# Patient Record
Sex: Female | Born: 1955
Health system: Southern US, Community
[De-identification: ages and names within clinical notes are randomized; demographics above are authoritative.]

## PROBLEM LIST (undated history)

## (undated) DIAGNOSIS — M1990 Unspecified osteoarthritis, unspecified site: Secondary | ICD-10-CM

## (undated) DIAGNOSIS — IMO0001 Reserved for inherently not codable concepts without codable children: Secondary | ICD-10-CM

## (undated) DIAGNOSIS — G4730 Sleep apnea, unspecified: Secondary | ICD-10-CM

## (undated) DIAGNOSIS — F419 Anxiety disorder, unspecified: Secondary | ICD-10-CM

## (undated) DIAGNOSIS — J189 Pneumonia, unspecified organism: Secondary | ICD-10-CM

## (undated) DIAGNOSIS — G459 Transient cerebral ischemic attack, unspecified: Secondary | ICD-10-CM

## (undated) DIAGNOSIS — D649 Anemia, unspecified: Secondary | ICD-10-CM

## (undated) DIAGNOSIS — G2581 Restless legs syndrome: Secondary | ICD-10-CM

## (undated) DIAGNOSIS — J302 Other seasonal allergic rhinitis: Secondary | ICD-10-CM

## (undated) DIAGNOSIS — E119 Type 2 diabetes mellitus without complications: Secondary | ICD-10-CM

## (undated) DIAGNOSIS — G629 Polyneuropathy, unspecified: Secondary | ICD-10-CM

## (undated) DIAGNOSIS — J40 Bronchitis, not specified as acute or chronic: Secondary | ICD-10-CM

## (undated) DIAGNOSIS — F32A Depression, unspecified: Secondary | ICD-10-CM

## (undated) DIAGNOSIS — I10 Essential (primary) hypertension: Secondary | ICD-10-CM

## (undated) DIAGNOSIS — T8859XA Other complications of anesthesia, initial encounter: Secondary | ICD-10-CM

## (undated) DIAGNOSIS — K219 Gastro-esophageal reflux disease without esophagitis: Secondary | ICD-10-CM

## (undated) DIAGNOSIS — E785 Hyperlipidemia, unspecified: Secondary | ICD-10-CM

## (undated) DIAGNOSIS — T4145XA Adverse effect of unspecified anesthetic, initial encounter: Secondary | ICD-10-CM

## (undated) DIAGNOSIS — R55 Syncope and collapse: Secondary | ICD-10-CM

## (undated) DIAGNOSIS — F329 Major depressive disorder, single episode, unspecified: Secondary | ICD-10-CM

## (undated) HISTORY — DX: Hyperlipidemia, unspecified: E78.5

## (undated) HISTORY — PX: TUBAL LIGATION: SHX77

## (undated) HISTORY — PX: BREAST LUMPECTOMY: SHX2

## (undated) HISTORY — PX: SHOULDER ARTHROSCOPY WITH ROTATOR CUFF REPAIR: SHX5685

## (undated) HISTORY — PX: HAND SURGERY: SHX662

---

## 2002-04-17 ENCOUNTER — Emergency Department (HOSPITAL_COMMUNITY): Admission: EM | Admit: 2002-04-17 | Discharge: 2002-04-17 | Payer: Self-pay | Admitting: Internal Medicine

## 2004-06-26 DIAGNOSIS — G459 Transient cerebral ischemic attack, unspecified: Secondary | ICD-10-CM

## 2004-06-26 HISTORY — DX: Transient cerebral ischemic attack, unspecified: G45.9

## 2007-01-24 ENCOUNTER — Ambulatory Visit: Payer: Self-pay | Admitting: Cardiology

## 2011-04-04 ENCOUNTER — Other Ambulatory Visit: Payer: Self-pay | Admitting: Orthopaedic Surgery

## 2011-04-04 ENCOUNTER — Other Ambulatory Visit (HOSPITAL_COMMUNITY): Payer: Medicare Other

## 2011-04-04 ENCOUNTER — Other Ambulatory Visit (HOSPITAL_COMMUNITY): Payer: Self-pay | Admitting: Orthopaedic Surgery

## 2011-04-04 ENCOUNTER — Encounter (HOSPITAL_COMMUNITY): Payer: Medicare Other

## 2011-04-04 ENCOUNTER — Ambulatory Visit (HOSPITAL_COMMUNITY)
Admission: RE | Admit: 2011-04-04 | Discharge: 2011-04-04 | Disposition: A | Discharge status: Home / Self Care | Payer: Medicare Other | Source: Ambulatory Visit | Attending: Orthopaedic Surgery | Admitting: Orthopaedic Surgery

## 2011-04-04 DIAGNOSIS — F17200 Nicotine dependence, unspecified, uncomplicated: Secondary | ICD-10-CM | POA: Insufficient documentation

## 2011-04-04 DIAGNOSIS — Z01818 Encounter for other preprocedural examination: Secondary | ICD-10-CM | POA: Insufficient documentation

## 2011-04-04 DIAGNOSIS — Z01812 Encounter for preprocedural laboratory examination: Secondary | ICD-10-CM | POA: Insufficient documentation

## 2011-04-04 DIAGNOSIS — E119 Type 2 diabetes mellitus without complications: Secondary | ICD-10-CM | POA: Insufficient documentation

## 2011-04-04 DIAGNOSIS — Z01810 Encounter for preprocedural cardiovascular examination: Secondary | ICD-10-CM | POA: Insufficient documentation

## 2011-04-04 DIAGNOSIS — I10 Essential (primary) hypertension: Secondary | ICD-10-CM | POA: Insufficient documentation

## 2011-04-04 LAB — CBC
HCT: 44.5 % (ref 36.0–46.0)
Hemoglobin: 15.3 g/dL — ABNORMAL HIGH (ref 12.0–15.0)
MCH: 32.8 pg (ref 26.0–34.0)
MCV: 95.5 fL (ref 78.0–100.0)
Platelets: 211 10*3/uL (ref 150–400)
RBC: 4.66 MIL/uL (ref 3.87–5.11)
WBC: 9 10*3/uL (ref 4.0–10.5)

## 2011-04-04 LAB — BASIC METABOLIC PANEL
CO2: 28 mEq/L (ref 19–32)
Calcium: 10 mg/dL (ref 8.4–10.5)
Chloride: 100 mEq/L (ref 96–112)
Glucose, Bld: 178 mg/dL — ABNORMAL HIGH (ref 70–99)
Potassium: 4.1 mEq/L (ref 3.5–5.1)
Sodium: 136 mEq/L (ref 135–145)

## 2011-04-04 LAB — PROTIME-INR: INR: 0.99 (ref 0.00–1.49)

## 2011-04-06 ENCOUNTER — Ambulatory Visit (HOSPITAL_COMMUNITY)
Admission: RE | Admit: 2011-04-06 | Discharge: 2011-04-06 | Disposition: A | Discharge status: Home / Self Care | Payer: Medicare Other | Source: Ambulatory Visit | Attending: Orthopaedic Surgery | Admitting: Orthopaedic Surgery

## 2011-04-06 DIAGNOSIS — M79609 Pain in unspecified limb: Secondary | ICD-10-CM | POA: Insufficient documentation

## 2011-04-06 DIAGNOSIS — F17200 Nicotine dependence, unspecified, uncomplicated: Secondary | ICD-10-CM | POA: Insufficient documentation

## 2011-04-06 DIAGNOSIS — E119 Type 2 diabetes mellitus without complications: Secondary | ICD-10-CM | POA: Insufficient documentation

## 2011-04-06 DIAGNOSIS — M7020 Olecranon bursitis, unspecified elbow: Secondary | ICD-10-CM | POA: Insufficient documentation

## 2011-04-06 DIAGNOSIS — K219 Gastro-esophageal reflux disease without esophagitis: Secondary | ICD-10-CM | POA: Insufficient documentation

## 2011-04-06 DIAGNOSIS — Z7982 Long term (current) use of aspirin: Secondary | ICD-10-CM | POA: Insufficient documentation

## 2011-04-06 DIAGNOSIS — M25529 Pain in unspecified elbow: Secondary | ICD-10-CM | POA: Insufficient documentation

## 2011-04-06 DIAGNOSIS — Z79899 Other long term (current) drug therapy: Secondary | ICD-10-CM | POA: Insufficient documentation

## 2011-04-06 DIAGNOSIS — I10 Essential (primary) hypertension: Secondary | ICD-10-CM | POA: Insufficient documentation

## 2011-04-06 DIAGNOSIS — M6530 Trigger finger, unspecified finger: Secondary | ICD-10-CM | POA: Insufficient documentation

## 2011-04-06 LAB — GLUCOSE, CAPILLARY: Glucose-Capillary: 225 mg/dL — ABNORMAL HIGH (ref 70–99)

## 2011-04-11 NOTE — Op Note (Signed)
NAMECREASIE, LACOSSE NO.:  [id number]  MEDICAL RECORD NO.:  [id number]  LOCATION:  DAYL                         FACILITY:  [facility]  PHYSICIAN:  Vanita Panda. Magnus Ivan, M.D.DATE OF BIRTH:  [birth year]  DATE OF PROCEDURE:  04/06/2011 DATE OF DISCHARGE:                              OPERATIVE REPORT   PREOPERATIVE DIAGNOSES: 1. Bilateral trigger thumbs. 2. Right elbow olecranon bursitis.  POSTOPERATIVE DIAGNOSES: 1. Bilateral trigger thumbs. 2. Right elbow olecranon bursitis.  PROCEDURES: 1. Right thumb A1 pulley release. 2. Right elbow olecranon bursa steroid injection. 3. Left thumb A1 pulley steroid injection.  SURGEON:  Vanita Panda. Magnus Ivan, M.D.  ANESTHESIA: 1. General. 2. Local.  INJECTIONS: 1. Right elbow with 1 mL of Depo-Medrol and 2 mL of 0.25% plain     Marcaine. 2. Left thumb with 1 mL of Depo-Medrol and 1 mL of 0.25% plain     Marcaine.  TOURNIQUET TIME:  12 minutes.  BLOOD LOSS:  Minimal.  COMPLICATIONS:  None.  INDICATIONS:  Victoria Lara is a 55 year old female with recurrent bilateral trigger thumb, who has failed multiple injections and conservative treatments.  She still has severe pain over the A1 pulley on both thumbs, and active triggering.  She has also developed right elbow olecranon bursitis.  At this  point, she wished to proceed with surgical intervention.  She understands the risks and benefits of the surgery including the risk of acute blood loss anemia, the risk of nerve and vessel injury and even given her age the risk of death from any surgery even as minor as this.  I have also discussed with her the success rate of A1 pulley  release and the injection as well as the effect of steroids on her blood glucose level.  She does wish to proceed with surgery.  PROCEDURE DESCRIPTION:  After informed consent was obtained, appropriate right thumb, left thumb, and right elbow were marked.  She was brought to the  operating room, placed supine on the operating table.  General anesthesia was then obtained.  We first turned attention to the right arm.  We placed a nonsterile tourniquet on her right upper arm and then her arm was prepped and draped from the olecranon bursa down to the fingers with DuraPrep and sterile drapes applied.  A time-out was called, which identified her as the correct patient and correct right thumb for the A1 pulley release, the right elbow for the injection, and left thumb for the steroid injection.  Esmarch was used to wrap up the hands and tourniquet was inflated to 250 mmHg of pressure.  I then made an incision directly over the A1 pulley and dissected down to the A1 pulley on the right thumb on the volar aspect of the hand to divide this in its entirety and watched the flexor tendon easily glide.  I then copiously irrigated the tissue and closed the skin with interrupted 3-0 nylon suture and infiltrated the incision with 0.25% plain Sensorcaine. Xeroform followed by a well-padded sterile dressing was applied.  Next, with the elbow we provided an injection of 2 mL of 0.25% plain Marcaine mixed with 1 mL of Depo-Medrol.  I placed a Band-Aid over this, then we went  over to the left thumb and cleaned this with Betadine and alcohol prior to an injection of 1 mL of 0.25% plain Marcaine and 1 mL of Depo- Medrol, which she tolerated well and we placed a Band-Aid over this. She was awakened, extubated, and taken to the recovery room in stable condition.  All final counts were correct.  There were no complications noted.  The tourniquet was let down and the fingers did pink nicely. There were no complications.  Postoperatively, she will be discharged from the recovery room to home with appropriate followup instructions.     Vanita Panda. Magnus Ivan, M.D.     CYB/MEDQ  D:  04/06/2011  T:  04/06/2011  Job:  [id number]  Electronically Signed by Doneen Poisson M.D. on  04/11/2011 09:10:47 PM

## 2011-04-11 NOTE — H&P (Signed)
Victoria Lara, Victoria Lara NO.:  [id number]  MEDICAL RECORD NO.:  [id number]  LOCATION:  DAYL                         FACILITY:  [facility]  PHYSICIAN:  Vanita Panda. Magnus Ivan, M.D.DATE OF BIRTH:  June 17, 1956  DATE OF ADMISSION:  04/06/2011 DATE OF DISCHARGE:                             HISTORY & PHYSICAL   CHIEF COMPLAINT:  Bilateral thumb pain and right elbow pain.  HISTORY OF PRESENT ILLNESS:  Victoria Lara is a 55 year old patient well- known to me.  She has bilateral trigger thumbs and has had at least 2 injections on each A1 pulley, they were unsuccessful.  She continues to have pain in triggering.  She has also developed pain over her right elbow olecranon bursa.  At this point, we are going to proceed to the operating room for a right thumb A1 pulley release and then steroid injection to the right elbow olecranon bursa and in the left thumb A1 pulley area.  She understands the risks and benefits of the surgery in detail.  She understands the hopeful success of this with near 100% success on the right side given the release and then significant success on the left arm and the right elbow given the steroid injection.  She also understands the risks and benefits of surgery in detail including the risks of nerve and vessel injury, acute blood loss, anemia, and even death in light of these several procedures.  She also understands that the steroid may affect her blood glucose.  It has not done that in the past, but it still is possible.  PAST MEDICAL HISTORY: 1. Diabetes. 2. Acid reflux. 3. Anxiety. 4. Arthritis. 5. Depression. 6. History of stroke/TIA.  CURRENT MEDICATION: 1. Metformin 2. Daily aspirin 3. Gabapentin 4. Lisinopril 5. Diprolene 6. Alprazolam 7. Omeprazole 8. Amitriptyline 9. Nabumetone.  ALLERGIES:  No known drug allergies.  FAMILY MEDICAL HISTORY:  Diabetes and high blood pressure.  SOCIAL HISTORY:  She is on disability.  She is a  Child psychotherapist.  She smokes half a pack a day for 15 years.  She does not drink alcohol.  REVIEW OF SYSTEMS:  Negative for chest pain, shortness of breath, fevers, chills, nausea, vomiting.  Positive for cough.  PHYSICAL EXAMINATION:  VITAL SIGNS:  She is afebrile, stable vital signs, her vitals can be seen on the records. GENERAL:  She is alert and oriented x3, in no acute distress. HEENT:  Normocephalic, atraumatic.  Pupils equal, round, reactive to light. NECK:  Supple. LUNGS:  Show upper respiratory congestion, but the bases are clear. HEART:  Regular rate and rhythm. ABDOMEN:  Benign. EXTREMITIES:  Both thumbs show active triggering with pain over the A1 pulley bilaterally.  Her right elbow shows pain over the olecranon bursa.  ASSESSMENT: 1. Bilateral trigger thumbs. 2. Right upper olecranon bursitis.  PLAN:  We are going to proceed to the operating room for; 1. Right thumb A1 pulley release. 2. Left thumb steroid injection. 3. Right elbow steroid injection, olecranon bursa.  This is an outpatient surgery, so she will be discharged following surgery.     Vanita Panda. Magnus Ivan, M.D.     CYB/MEDQ  D:  04/06/2011  T:  04/06/2011  Job:  [id number]  Electronically Signed  by Doneen Poisson M.D. on 04/11/2011 09:10:45 PM

## 2013-01-03 ENCOUNTER — Encounter (HOSPITAL_COMMUNITY): Payer: Self-pay

## 2013-01-03 ENCOUNTER — Emergency Department (HOSPITAL_COMMUNITY): Payer: PRIVATE HEALTH INSURANCE

## 2013-01-03 ENCOUNTER — Emergency Department (HOSPITAL_COMMUNITY)
Admission: EM | Admit: 2013-01-03 | Discharge: 2013-01-03 | Disposition: A | Discharge status: Home / Self Care | Payer: PRIVATE HEALTH INSURANCE | Attending: Emergency Medicine | Admitting: Emergency Medicine

## 2013-01-03 DIAGNOSIS — Z7982 Long term (current) use of aspirin: Secondary | ICD-10-CM | POA: Insufficient documentation

## 2013-01-03 DIAGNOSIS — F3289 Other specified depressive episodes: Secondary | ICD-10-CM | POA: Insufficient documentation

## 2013-01-03 DIAGNOSIS — J309 Allergic rhinitis, unspecified: Secondary | ICD-10-CM | POA: Insufficient documentation

## 2013-01-03 DIAGNOSIS — Z791 Long term (current) use of non-steroidal anti-inflammatories (NSAID): Secondary | ICD-10-CM | POA: Insufficient documentation

## 2013-01-03 DIAGNOSIS — I10 Essential (primary) hypertension: Secondary | ICD-10-CM | POA: Insufficient documentation

## 2013-01-03 DIAGNOSIS — Z79899 Other long term (current) drug therapy: Secondary | ICD-10-CM | POA: Insufficient documentation

## 2013-01-03 DIAGNOSIS — F17200 Nicotine dependence, unspecified, uncomplicated: Secondary | ICD-10-CM | POA: Insufficient documentation

## 2013-01-03 DIAGNOSIS — N390 Urinary tract infection, site not specified: Secondary | ICD-10-CM | POA: Insufficient documentation

## 2013-01-03 DIAGNOSIS — F329 Major depressive disorder, single episode, unspecified: Secondary | ICD-10-CM | POA: Insufficient documentation

## 2013-01-03 DIAGNOSIS — E1169 Type 2 diabetes mellitus with other specified complication: Secondary | ICD-10-CM | POA: Insufficient documentation

## 2013-01-03 HISTORY — DX: Type 2 diabetes mellitus without complications: E11.9

## 2013-01-03 HISTORY — DX: Major depressive disorder, single episode, unspecified: F32.9

## 2013-01-03 HISTORY — DX: Essential (primary) hypertension: I10

## 2013-01-03 HISTORY — DX: Other seasonal allergic rhinitis: J30.2

## 2013-01-03 HISTORY — DX: Depression, unspecified: F32.A

## 2013-01-03 LAB — CBC WITH DIFFERENTIAL/PLATELET
Band Neutrophils: 0 % (ref 0–10)
Basophils Absolute: 0 10*3/uL (ref 0.0–0.1)
Basophils Relative: 0 % (ref 0–1)
Blasts: 0 %
Eosinophils Absolute: 0 10*3/uL (ref 0.0–0.7)
Eosinophils Relative: 0 % (ref 0–5)
HCT: 44.4 % (ref 36.0–46.0)
Hemoglobin: 15.5 g/dL — ABNORMAL HIGH (ref 12.0–15.0)
Lymphocytes Relative: 1 % — ABNORMAL LOW (ref 12–46)
Lymphs Abs: 0.1 10*3/uL — ABNORMAL LOW (ref 0.7–4.0)
MCV: 94.9 fL (ref 78.0–100.0)
Metamyelocytes Relative: 0 %
Monocytes Absolute: 0.6 10*3/uL (ref 0.1–1.0)
Monocytes Relative: 4 % (ref 3–12)
RBC: 4.68 MIL/uL (ref 3.87–5.11)
RDW: 13.1 % (ref 11.5–15.5)
WBC: 14.5 10*3/uL — ABNORMAL HIGH (ref 4.0–10.5)

## 2013-01-03 LAB — URINALYSIS, ROUTINE W REFLEX MICROSCOPIC
Glucose, UA: >1000 mg/dL — AB
Ketones, ur: 40 mg/dL — AB
Protein, ur: 100 mg/dL — AB
Urobilinogen, UA: 1 mg/dL (ref 0.0–1.0)

## 2013-01-03 LAB — BASIC METABOLIC PANEL
Calcium: 10.3 mg/dL (ref 8.4–10.5)
GFR calc Af Amer: >90 mL/min (ref >90)
GFR calc non Af Amer: >90 mL/min (ref >90)
Glucose, Bld: 401 mg/dL — ABNORMAL HIGH (ref 70–99)
Potassium: 4.3 mEq/L (ref 3.5–5.1)
Sodium: 131 mEq/L — ABNORMAL LOW (ref 135–145)

## 2013-01-03 LAB — URINE MICROSCOPIC-ADD ON

## 2013-01-03 MED ORDER — CIPROFLOXACIN HCL 500 MG PO TABS: 500.0000 mg | ORAL_TABLET | Freq: Two times a day (BID) | ORAL | Status: DC

## 2013-01-03 MED ORDER — DEXTROSE 5 % IV SOLN
1.0000 g | Freq: Once | INTRAVENOUS | Status: AC
  Administered 2013-01-03: 1 g via INTRAVENOUS
  Filled 2013-01-03: qty 10

## 2013-01-03 MED ORDER — INSULIN ASPART 100 UNIT/ML ~~LOC~~ SOLN
5.0000 [IU] | Freq: Once | SUBCUTANEOUS | Status: AC
  Administered 2013-01-03: 5 [IU] via INTRAVENOUS
  Filled 2013-01-03: qty 1

## 2013-01-03 MED ORDER — SODIUM CHLORIDE 0.9 % IV BOLUS (SEPSIS)
1000.0000 mL | Freq: Once | INTRAVENOUS | Status: AC
  Administered 2013-01-03: 1000 mL via INTRAVENOUS

## 2013-01-03 NOTE — ED Provider Notes (Signed)
History    This chart was scribed for Donnetta Hutching, MD, by Frederik Pear, ED scribe. The patient was seen in room APA08/APA08 and the patient's care was started at 1707.   CSN: [id number] Arrival date & time 01/03/13  1618  First MD Initiated Contact with Patient 01/03/13 1707     Chief Complaint  Patient presents with  . Hyperglycemia  . Fever   (Consider location/radiation/quality/duration/timing/severity/associated sxs/prior Treatment) The history is provided by the patient, medical records and a relative. No language interpreter was used.   HPI Comments: Victoria Lara is a 57 y.o. female with a h/o of hypertension and DM who presents to the Emergency Department complaining of an intermittent fever that spiked at 103.4 earlier today with associated general malaise that began 3 days ago. She also complains of diaphoresis and chills that began today. In ED, her temperature is 100.8. Her daughter in law reports she called her this afternoon, and she was confused on the phone. When she arrived, she was increasingly confused so they went to her PCP, Dr. Neita Carp at [facility] in [place]. At the PCP, her temperature was 103.4, HR was 138, and CBG was 302. They administered a dose of Tylenol, which provided some relief. In ED, her HR is 131 and CBG was 378. She reports she has not eaten today because she has not had an appetite. She has a h/o of chronic right-sided lower back pain related to sciatica and chronic sinusitis. She is a current, every day smoker.   Past Medical History  Diagnosis Date  . Diabetes mellitus without complication   . Hypertension   . Depression   . Seasonal allergies    History reviewed. No pertinent past surgical history. No family history on file. History  Substance Use Topics  . Smoking status: Current Every Day Smoker  . Smokeless tobacco: Not on file  . Alcohol Use: No   OB History   Grav Para Term Preterm Abortions TAB SAB Ect Mult Living                 Review of Systems A complete 10 system review of systems was obtained and all systems are negative except as noted in the HPI and PMH.  Allergies  Review of patient's allergies indicates no known allergies.  Home Medications   Current Outpatient Rx  Name  Route  Sig  Dispense  Refill  . amitriptyline (ELAVIL) 25 MG tablet   Oral   Take 25 mg by mouth at bedtime.         Marland Kitchen aspirin 325 MG tablet   Oral   Take 325 mg by mouth daily.         Marland Kitchen buPROPion (WELLBUTRIN XL) 150 MG 24 hr tablet   Oral   Take 150 mg by mouth daily.         . cetirizine (ZYRTEC) 10 MG tablet   Oral   Take 10 mg by mouth daily.         . cyclobenzaprine (FLEXERIL) 10 MG tablet   Oral   Take 10 mg by mouth 3 (three) times daily as needed for muscle spasms.         . diclofenac (VOLTAREN) 75 MG EC tablet   Oral   Take 75 mg by mouth 2 (two) times daily.         Marland Kitchen gabapentin (NEURONTIN) 300 MG capsule   Oral   Take 300 mg by mouth 2 (two) times daily.         Marland Kitchen  lisinopril (PRINIVIL,ZESTRIL) 20 MG tablet   Oral   Take 20 mg by mouth daily.         . metFORMIN (GLUCOPHAGE) 850 MG tablet   Oral   Take 850 mg by mouth 3 (three) times daily.         . naproxen (NAPROSYN) 500 MG tablet   Oral   Take 500 mg by mouth 2 (two) times daily.         Marland Kitchen omeprazole (PRILOSEC) 20 MG capsule   Oral   Take 20 mg by mouth daily.         Marland Kitchen rOPINIRole (REQUIP) 0.25 MG tablet   Oral   Take 0.25 mg by mouth daily.          BP 121/97  Pulse 107  Temp(Src) 100.8 F (38.2 C) (Oral)  Resp 16  Ht 5\' 1"  (1.549 m)  Wt 197 lb (89.359 kg)  BMI 37.24 kg/m2  SpO2 91% Physical Exam  Nursing note and vitals reviewed. Constitutional: She is oriented to person, place, and time. She appears well-developed and well-nourished.  Obese.  HENT:  Head: Normocephalic and atraumatic.  Eyes: Conjunctivae and EOM are normal. Pupils are equal, round, and reactive to light.  Neck: Normal range of  motion. Neck supple.  Cardiovascular: Normal rate, regular rhythm and normal heart sounds.   Pulmonary/Chest: Effort normal. She has wheezes. She has rhonchi.  Bilateral expiratory wheezes and rhonchi.  Abdominal: Soft. Bowel sounds are normal. There is no tenderness. There is no CVA tenderness.  No CVA or abdominal tenderness.   Musculoskeletal: Normal range of motion.  Neurological: She is alert and oriented to person, place, and time.  Skin: Skin is warm and dry.  Psychiatric: She has a normal mood and affect.    ED Course  Procedures (including critical care time)  DIAGNOSTIC STUDIES: Oxygen Saturation is 93% on room air, adequate by my interpretation.    COORDINATION OF CARE:  17:24- Discussed planned course of treatment with the patient, chest X-ray, IV fluids, UA, and blood work, who is agreeable at this time.  21:14- Discussed that lab work results indicate a severe UTI. Will administer IV Rocephin in ED and discharge with a course antibiotics. Discussed drinking plenty of fluids and following up with PCP.  Labs Reviewed  CBC WITH DIFFERENTIAL - Abnormal; Notable for the following:    WBC 14.5 (*)    Hemoglobin 15.5 (*)    Platelets 143 (*)    Neutrophils Relative % 95 (*)    Lymphocytes Relative 1 (*)    Neutro Abs 13.8 (*)    Lymphs Abs 0.1 (*)    All other components within normal limits  BASIC METABOLIC PANEL - Abnormal; Notable for the following:    Sodium 131 (*)    Chloride 93 (*)    Glucose, Bld 401 (*)    All other components within normal limits  GLUCOSE, CAPILLARY - Abnormal; Notable for the following:    Glucose-Capillary 375 (*)    All other components within normal limits  URINALYSIS, ROUTINE W REFLEX MICROSCOPIC   Dg Chest 2 View  01/03/2013   *RADIOLOGY REPORT*  Clinical Data: Fever.  CHEST - 2 VIEW  Comparison: PA and lateral chest 04/04/2011.  Findings: The lungs are clear.  Heart size is upper normal.  No pneumothorax or pleural fluid.   IMPRESSION: No acute disease.   Original Report Authenticated By: Holley Dexter, M.D.   No diagnosis found.  MDM  Patient feels  much better after IV fluids, Tylenol, IV Rocephin. Glucose is slightly elevated. Patient is not in DKA. We'll discharge home on Cipro by mouth. Patient understands to return if worse I personally performed the services described in this documentation, which was scribed in my presence. The recorded information has been reviewed and is accurate.    Donnetta Hutching, MD 01/03/13 2206

## 2013-01-03 NOTE — ED Notes (Signed)
My blood sugar is high and I have been running a fever. Blood sugar was 302 at [facility] per pt. Has been running a fever for 2-3 days per family.

## 2013-01-06 LAB — URINE CULTURE: Colony Count: >=100000

## 2013-09-23 ENCOUNTER — Other Ambulatory Visit (HOSPITAL_COMMUNITY): Payer: Self-pay | Admitting: Orthopedic Surgery

## 2013-09-23 DIAGNOSIS — M25511 Pain in right shoulder: Secondary | ICD-10-CM

## 2013-09-29 ENCOUNTER — Ambulatory Visit (HOSPITAL_COMMUNITY): Payer: PRIVATE HEALTH INSURANCE

## 2013-09-30 ENCOUNTER — Ambulatory Visit (HOSPITAL_COMMUNITY)
Admission: RE | Admit: 2013-09-30 | Discharge: 2013-09-30 | Disposition: A | Discharge status: Home / Self Care | Payer: PRIVATE HEALTH INSURANCE | Source: Ambulatory Visit | Attending: Orthopedic Surgery | Admitting: Orthopedic Surgery

## 2013-09-30 DIAGNOSIS — M25519 Pain in unspecified shoulder: Secondary | ICD-10-CM | POA: Insufficient documentation

## 2013-09-30 DIAGNOSIS — M6789 Other specified disorders of synovium and tendon, multiple sites: Secondary | ICD-10-CM | POA: Insufficient documentation

## 2013-09-30 DIAGNOSIS — X58XXXA Exposure to other specified factors, initial encounter: Secondary | ICD-10-CM | POA: Insufficient documentation

## 2013-09-30 DIAGNOSIS — M25511 Pain in right shoulder: Secondary | ICD-10-CM

## 2013-09-30 DIAGNOSIS — S46819A Strain of other muscles, fascia and tendons at shoulder and upper arm level, unspecified arm, initial encounter: Secondary | ICD-10-CM | POA: Insufficient documentation

## 2014-01-17 ENCOUNTER — Encounter (HOSPITAL_COMMUNITY): Payer: Self-pay | Admitting: Emergency Medicine

## 2014-01-17 ENCOUNTER — Emergency Department (HOSPITAL_COMMUNITY): Payer: PRIVATE HEALTH INSURANCE

## 2014-01-17 ENCOUNTER — Emergency Department (HOSPITAL_COMMUNITY)
Admission: EM | Admit: 2014-01-17 | Discharge: 2014-01-17 | Disposition: A | Discharge status: Home / Self Care | Payer: PRIVATE HEALTH INSURANCE | Attending: Emergency Medicine | Admitting: Emergency Medicine

## 2014-01-17 DIAGNOSIS — I10 Essential (primary) hypertension: Secondary | ICD-10-CM | POA: Diagnosis not present

## 2014-01-17 DIAGNOSIS — Z7982 Long term (current) use of aspirin: Secondary | ICD-10-CM | POA: Insufficient documentation

## 2014-01-17 DIAGNOSIS — W108XXA Fall (on) (from) other stairs and steps, initial encounter: Secondary | ICD-10-CM | POA: Insufficient documentation

## 2014-01-17 DIAGNOSIS — Z8709 Personal history of other diseases of the respiratory system: Secondary | ICD-10-CM | POA: Diagnosis not present

## 2014-01-17 DIAGNOSIS — F17200 Nicotine dependence, unspecified, uncomplicated: Secondary | ICD-10-CM | POA: Diagnosis not present

## 2014-01-17 DIAGNOSIS — S99919A Unspecified injury of unspecified ankle, initial encounter: Principal | ICD-10-CM

## 2014-01-17 DIAGNOSIS — F3289 Other specified depressive episodes: Secondary | ICD-10-CM | POA: Insufficient documentation

## 2014-01-17 DIAGNOSIS — S99929A Unspecified injury of unspecified foot, initial encounter: Principal | ICD-10-CM

## 2014-01-17 DIAGNOSIS — S8990XA Unspecified injury of unspecified lower leg, initial encounter: Secondary | ICD-10-CM | POA: Diagnosis not present

## 2014-01-17 DIAGNOSIS — Z791 Long term (current) use of non-steroidal anti-inflammatories (NSAID): Secondary | ICD-10-CM | POA: Diagnosis not present

## 2014-01-17 DIAGNOSIS — W19XXXA Unspecified fall, initial encounter: Secondary | ICD-10-CM

## 2014-01-17 DIAGNOSIS — Y9389 Activity, other specified: Secondary | ICD-10-CM | POA: Insufficient documentation

## 2014-01-17 DIAGNOSIS — Y92009 Unspecified place in unspecified non-institutional (private) residence as the place of occurrence of the external cause: Secondary | ICD-10-CM | POA: Insufficient documentation

## 2014-01-17 DIAGNOSIS — M25562 Pain in left knee: Secondary | ICD-10-CM

## 2014-01-17 DIAGNOSIS — IMO0002 Reserved for concepts with insufficient information to code with codable children: Secondary | ICD-10-CM | POA: Diagnosis not present

## 2014-01-17 DIAGNOSIS — E119 Type 2 diabetes mellitus without complications: Secondary | ICD-10-CM | POA: Insufficient documentation

## 2014-01-17 DIAGNOSIS — Z79899 Other long term (current) drug therapy: Secondary | ICD-10-CM | POA: Insufficient documentation

## 2014-01-17 DIAGNOSIS — F329 Major depressive disorder, single episode, unspecified: Secondary | ICD-10-CM | POA: Insufficient documentation

## 2014-01-17 LAB — URINALYSIS, ROUTINE W REFLEX MICROSCOPIC
Bilirubin Urine: NEGATIVE
Glucose, UA: 500 mg/dL — AB
HGB URINE DIPSTICK: NEGATIVE
Nitrite: NEGATIVE
Protein, ur: NEGATIVE mg/dL
SPECIFIC GRAVITY, URINE: 1.025 (ref 1.005–1.030)
UROBILINOGEN UA: 0.2 mg/dL (ref 0.0–1.0)
pH: 6 (ref 5.0–8.0)

## 2014-01-17 LAB — BASIC METABOLIC PANEL
Anion gap: 14 (ref 5–15)
BUN: 22 mg/dL (ref 6–23)
CO2: 25 mEq/L (ref 19–32)
Calcium: 9.5 mg/dL (ref 8.4–10.5)
Chloride: 99 mEq/L (ref 96–112)
Creatinine, Ser: 0.62 mg/dL (ref 0.50–1.10)
GFR calc non Af Amer: >90 mL/min (ref >90)
Glucose, Bld: 285 mg/dL — ABNORMAL HIGH (ref 70–99)
POTASSIUM: 4.1 meq/L (ref 3.7–5.3)
Sodium: 138 mEq/L (ref 137–147)

## 2014-01-17 LAB — CBC WITH DIFFERENTIAL/PLATELET
Basophils Absolute: 0 10*3/uL (ref 0.0–0.1)
Basophils Relative: 0 % (ref 0–1)
Eosinophils Absolute: 0.2 10*3/uL (ref 0.0–0.7)
Eosinophils Relative: 2 % (ref 0–5)
HCT: 43.3 % (ref 36.0–46.0)
Hemoglobin: 14.9 g/dL (ref 12.0–15.0)
LYMPHS ABS: 3.2 10*3/uL (ref 0.7–4.0)
Lymphocytes Relative: 32 % (ref 12–46)
MCH: 32.9 pg (ref 26.0–34.0)
MCHC: 34.4 g/dL (ref 30.0–36.0)
MCV: 95.6 fL (ref 78.0–100.0)
MONOS PCT: 7 % (ref 3–12)
Monocytes Absolute: 0.7 10*3/uL (ref 0.1–1.0)
Neutro Abs: 5.8 10*3/uL (ref 1.7–7.7)
Neutrophils Relative %: 59 % (ref 43–77)
Platelets: 231 10*3/uL (ref 150–400)
RBC: 4.53 MIL/uL (ref 3.87–5.11)
RDW: 13.3 % (ref 11.5–15.5)
WBC: 9.8 10*3/uL (ref 4.0–10.5)

## 2014-01-17 LAB — URINE MICROSCOPIC-ADD ON

## 2014-01-17 NOTE — ED Notes (Signed)
Swelling left knee injured during fall, ice applied prior to arrival

## 2014-01-17 NOTE — ED Notes (Signed)
Fell at home, had near syncopal episodes prior to fall, states she has a history of near syncopal episodes

## 2014-01-17 NOTE — Discharge Instructions (Signed)
X-ray shows no fracture the knee. A glucose today was 285. Ace wrap to knee. Ice. Referral to orthopedics if not improving. Phone number given.

## 2014-01-17 NOTE — ED Provider Notes (Signed)
CSN: [id number]     Arrival date & time 01/17/14  1518 History  This chart was scribed for Victoria HutchingBrian Issaac Shipper, MD by [place]Jordan Peace, ED Scribe. The patient was seen in APA05/APA05. The patient's care was started at 3:33 PM.    Chief Complaint  Patient presents with  . Fall      Patient is a 58 y.o. female presenting with fall. The history is provided by the patient and a relative. No language interpreter was used.  Fall  HPI Comments: Victoria HornsGeraldine J Lara is a 58 y.o. female who presents to the Emergency Department complaining of fall onset earlier today that occurred when she was coming down the stairs and fell. Now complains of left knee pain. Pt reports multiple syncope instances pta and has history of this as well that has been going on for years. Pt's daughter states that she hit her head when she fell and sustained a hematoma on the posterior of her head. No neurological deficits, neck pain.  Normal behavior.  Past Medical History  Diagnosis Date  . Diabetes mellitus without complication   . Hypertension   . Depression   . Seasonal allergies    Past Surgical History  Procedure Laterality Date  . Hand surgery     No family history on file. History  Substance Use Topics  . Smoking status: Current Every Day Smoker  . Smokeless tobacco: Not on file  . Alcohol Use: No   OB History   Grav Para Term Preterm Abortions TAB SAB Ect Mult Living                 Review of Systems    Allergies  Review of patient's allergies indicates no known allergies.  Home Medications   Prior to Admission medications   Medication Sig Start Date End Date Taking? Authorizing Provider  amitriptyline (ELAVIL) 25 MG tablet Take 25 mg by mouth at bedtime.   Yes Historical Provider, MD  aspirin EC 325 MG tablet Take 325 mg by mouth daily.   Yes Historical Provider, MD  buPROPion (WELLBUTRIN XL) 150 MG 24 hr tablet Take 150 mg by mouth 2 (two) times daily.    Yes Historical Provider, MD  cetirizine  (ZYRTEC) 10 MG tablet Take 10 mg by mouth daily.   Yes Historical Provider, MD  diclofenac (VOLTAREN) 75 MG EC tablet Take 75 mg by mouth daily.    Yes Historical Provider, MD  fluticasone (FLONASE) 50 MCG/ACT nasal spray Place 1 spray into both nostrils 2 (two) times daily. 01/05/14  Yes Historical Provider, MD  gabapentin (NEURONTIN) 300 MG capsule Take 300 mg by mouth 2 (two) times daily.   Yes Historical Provider, MD  lisinopril (PRINIVIL,ZESTRIL) 20 MG tablet Take 20 mg by mouth daily.   Yes Historical Provider, MD  metFORMIN (GLUCOPHAGE) 850 MG tablet Take 850 mg by mouth 3 (three) times daily.   Yes Historical Provider, MD  omeprazole (PRILOSEC) 20 MG capsule Take 20 mg by mouth daily.   Yes Historical Provider, MD  rOPINIRole (REQUIP) 0.25 MG tablet Take 0.25 mg by mouth every evening.    Yes Historical Provider, MD   BP 130/71  Pulse 96  Temp(Src) 99.1 F (37.3 C) (Oral)  Resp 20  Ht 5\' 1"  (1.549 m)  Wt 190 lb (86.183 kg)  BMI 35.92 kg/m2  SpO2 94% Physical Exam  Nursing note and vitals reviewed. Constitutional: She is oriented to person, place, and time. She appears well-developed and well-nourished.  HENT:  Head:  Normocephalic and atraumatic.  Eyes: Conjunctivae and EOM are normal. Pupils are equal, round, and reactive to light.  Neck: Normal range of motion. Neck supple.  Cardiovascular: Normal rate, regular rhythm and normal heart sounds.   Pulmonary/Chest: Effort normal and breath sounds normal.  Abdominal: Soft. Bowel sounds are normal.  Musculoskeletal: Normal range of motion. She exhibits tenderness (left knee).  Neurological: She is alert and oriented to person, place, and time.  Skin: Skin is warm and dry.  Psychiatric: She has a normal mood and affect. Her behavior is normal.    ED Course  Procedures (including critical care time)  Medications - No data to display   DIAGNOSTIC STUDIES: Oxygen Saturation is 94% on room air, adequate by my interpretation.     COORDINATION OF CARE: 3:37 PM- Treatment plan was discussed with patient which includes x-rays who verbalizes understanding and agrees.     Labs Review Results for orders placed during the hospital encounter of 01/17/14  BASIC METABOLIC PANEL      Result Value Ref Range   Sodium 138  137 - 147 mEq/L   Potassium 4.1  3.7 - 5.3 mEq/L   Chloride 99  96 - 112 mEq/L   CO2 25  19 - 32 mEq/L   Glucose, Bld 285 (*) 70 - 99 mg/dL   BUN 22  6 - 23 mg/dL   Creatinine, Ser 1.61  0.50 - 1.10 mg/dL   Calcium 9.5  8.4 - 09.6 mg/dL   GFR calc non Af Amer >90  >90 mL/min   GFR calc Af Amer >90  >90 mL/min   Anion gap 14  5 - 15  CBC WITH DIFFERENTIAL      Result Value Ref Range   WBC 9.8  4.0 - 10.5 K/uL   RBC 4.53  3.87 - 5.11 MIL/uL   Hemoglobin 14.9  12.0 - 15.0 g/dL   HCT 04.5  40.9 - 81.1 %   MCV 95.6  78.0 - 100.0 fL   MCH 32.9  26.0 - 34.0 pg   MCHC 34.4  30.0 - 36.0 g/dL   RDW 91.4  78.2 - 95.6 %   Platelets 231  150 - 400 K/uL   Neutrophils Relative % 59  43 - 77 %   Neutro Abs 5.8  1.7 - 7.7 K/uL   Lymphocytes Relative 32  12 - 46 %   Lymphs Abs 3.2  0.7 - 4.0 K/uL   Monocytes Relative 7  3 - 12 %   Monocytes Absolute 0.7  0.1 - 1.0 K/uL   Eosinophils Relative 2  0 - 5 %   Eosinophils Absolute 0.2  0.0 - 0.7 K/uL   Basophils Relative 0  0 - 1 %   Basophils Absolute 0.0  0.0 - 0.1 K/uL  URINALYSIS, ROUTINE W REFLEX MICROSCOPIC      Result Value Ref Range   Color, Urine YELLOW  YELLOW   APPearance CLEAR  CLEAR   Specific Gravity, Urine 1.025  1.005 - 1.030   pH 6.0  5.0 - 8.0   Glucose, UA 500 (*) NEGATIVE mg/dL   Hgb urine dipstick NEGATIVE  NEGATIVE   Bilirubin Urine NEGATIVE  NEGATIVE   Ketones, ur TRACE (*) NEGATIVE mg/dL   Protein, ur NEGATIVE  NEGATIVE mg/dL   Urobilinogen, UA 0.2  0.0 - 1.0 mg/dL   Nitrite NEGATIVE  NEGATIVE   Leukocytes, UA TRACE (*) NEGATIVE  URINE MICROSCOPIC-ADD ON      Result Value Ref Range  Squamous Epithelial / LPF FEW (*) RARE    WBC, UA 0-2  <3 WBC/hpf   RBC / HPF 0-2  <3 RBC/hpf   Casts HYALINE CASTS (*) NEGATIVE   No results found.    Imaging Review Dg Knee Complete 4 Views Left  01/17/2014   CLINICAL DATA:  58 year old female with knee injury and pain.  EXAM: LEFT KNEE - COMPLETE 4+ VIEW  COMPARISON:  None.  FINDINGS: There is no evidence of fracture, subluxation or dislocation.  No definite knee effusion is present.  A few small calcifications along the anterior and posterior aspect of the joint may represent loose bodies.  Very mild tricompartmental degenerative changes are noted.  No focal bony abnormalities are identified.  IMPRESSION: No evidence of acute bony abnormality.  Possible loose bodies within the joint.  Very mild tricompartmental degenerative changes.   Electronically Signed   By: Laveda Abbe M.D.   On: 01/17/2014 17:15     EKG Interpretation   Date/Time:  Saturday January 17 2014 16:02:48 EDT Ventricular Rate:  92 PR Interval:  150 QRS Duration: 99 QT Interval:  366 QTC Calculation: 453 R Axis:   81 Text Interpretation:  Sinus rhythm Atrial premature complex Consider left  atrial enlargement Low voltage, precordial leads Confirmed by Amelda Hapke  MD,  Luisenrique Conran ([id number]) on 01/17/2014 4:07:49 PM      MDM   Final diagnoses:  Fall, initial encounter  Left knee pain    Normal neurological exam. Left knee x-ray shows no fracture. Glucose 285.  Referral to orthopedics.  I personally performed the services described in this documentation, which was scribed in my presence. The recorded information has been reviewed and is accurate.    Victoria Hutching, MD 01/17/14 [phone]

## 2014-02-06 ENCOUNTER — Encounter (HOSPITAL_COMMUNITY): Payer: Self-pay | Admitting: Emergency Medicine

## 2014-02-06 ENCOUNTER — Inpatient Hospital Stay (HOSPITAL_COMMUNITY)
Admission: EM | Admit: 2014-02-06 | Discharge: 2014-02-07 | DRG: 190 | Disposition: A | Discharge status: Home Health | Payer: PRIVATE HEALTH INSURANCE | Attending: Internal Medicine | Admitting: Internal Medicine

## 2014-02-06 DIAGNOSIS — E872 Acidosis, unspecified: Secondary | ICD-10-CM | POA: Diagnosis present

## 2014-02-06 DIAGNOSIS — R0989 Other specified symptoms and signs involving the circulatory and respiratory systems: Secondary | ICD-10-CM | POA: Diagnosis not present

## 2014-02-06 DIAGNOSIS — R410 Disorientation, unspecified: Secondary | ICD-10-CM | POA: Diagnosis present

## 2014-02-06 DIAGNOSIS — Z6835 Body mass index (BMI) 35.0-35.9, adult: Secondary | ICD-10-CM

## 2014-02-06 DIAGNOSIS — E119 Type 2 diabetes mellitus without complications: Secondary | ICD-10-CM | POA: Diagnosis present

## 2014-02-06 DIAGNOSIS — Z7982 Long term (current) use of aspirin: Secondary | ICD-10-CM

## 2014-02-06 DIAGNOSIS — R0689 Other abnormalities of breathing: Secondary | ICD-10-CM

## 2014-02-06 DIAGNOSIS — I10 Essential (primary) hypertension: Secondary | ICD-10-CM | POA: Diagnosis present

## 2014-02-06 DIAGNOSIS — Z79899 Other long term (current) drug therapy: Secondary | ICD-10-CM

## 2014-02-06 DIAGNOSIS — J449 Chronic obstructive pulmonary disease, unspecified: Secondary | ICD-10-CM | POA: Diagnosis present

## 2014-02-06 DIAGNOSIS — E1169 Type 2 diabetes mellitus with other specified complication: Secondary | ICD-10-CM

## 2014-02-06 DIAGNOSIS — J441 Chronic obstructive pulmonary disease with (acute) exacerbation: Secondary | ICD-10-CM | POA: Diagnosis not present

## 2014-02-06 DIAGNOSIS — F17200 Nicotine dependence, unspecified, uncomplicated: Secondary | ICD-10-CM | POA: Diagnosis present

## 2014-02-06 DIAGNOSIS — J9600 Acute respiratory failure, unspecified whether with hypoxia or hypercapnia: Secondary | ICD-10-CM | POA: Diagnosis present

## 2014-02-06 DIAGNOSIS — R0902 Hypoxemia: Secondary | ICD-10-CM | POA: Diagnosis present

## 2014-02-06 DIAGNOSIS — K219 Gastro-esophageal reflux disease without esophagitis: Secondary | ICD-10-CM | POA: Diagnosis present

## 2014-02-06 DIAGNOSIS — R0609 Other forms of dyspnea: Secondary | ICD-10-CM | POA: Diagnosis not present

## 2014-02-06 DIAGNOSIS — E669 Obesity, unspecified: Secondary | ICD-10-CM | POA: Diagnosis present

## 2014-02-06 DIAGNOSIS — R4182 Altered mental status, unspecified: Secondary | ICD-10-CM | POA: Diagnosis present

## 2014-02-06 DIAGNOSIS — J9601 Acute respiratory failure with hypoxia: Secondary | ICD-10-CM

## 2014-02-06 NOTE — ED Notes (Signed)
Pt had right shoulder surgery rotator cuff repair.  Tonight the family noted that the pt was confused with some slurring of speech.   Pt is currently taking percocet for pain and has been groggy

## 2014-02-07 ENCOUNTER — Encounter (HOSPITAL_COMMUNITY): Payer: Self-pay | Admitting: Family Medicine

## 2014-02-07 ENCOUNTER — Emergency Department (HOSPITAL_COMMUNITY): Payer: PRIVATE HEALTH INSURANCE

## 2014-02-07 DIAGNOSIS — E872 Acidosis, unspecified: Secondary | ICD-10-CM | POA: Diagnosis present

## 2014-02-07 DIAGNOSIS — F329 Major depressive disorder, single episode, unspecified: Secondary | ICD-10-CM | POA: Insufficient documentation

## 2014-02-07 DIAGNOSIS — I10 Essential (primary) hypertension: Secondary | ICD-10-CM | POA: Diagnosis present

## 2014-02-07 DIAGNOSIS — J449 Chronic obstructive pulmonary disease, unspecified: Secondary | ICD-10-CM | POA: Diagnosis present

## 2014-02-07 DIAGNOSIS — E119 Type 2 diabetes mellitus without complications: Secondary | ICD-10-CM

## 2014-02-07 DIAGNOSIS — R4182 Altered mental status, unspecified: Secondary | ICD-10-CM

## 2014-02-07 DIAGNOSIS — J441 Chronic obstructive pulmonary disease with (acute) exacerbation: Principal | ICD-10-CM

## 2014-02-07 DIAGNOSIS — R410 Disorientation, unspecified: Secondary | ICD-10-CM | POA: Diagnosis present

## 2014-02-07 DIAGNOSIS — Z7982 Long term (current) use of aspirin: Secondary | ICD-10-CM | POA: Diagnosis not present

## 2014-02-07 DIAGNOSIS — E1169 Type 2 diabetes mellitus with other specified complication: Secondary | ICD-10-CM | POA: Insufficient documentation

## 2014-02-07 DIAGNOSIS — Z6835 Body mass index (BMI) 35.0-35.9, adult: Secondary | ICD-10-CM | POA: Diagnosis not present

## 2014-02-07 DIAGNOSIS — E669 Obesity, unspecified: Secondary | ICD-10-CM

## 2014-02-07 DIAGNOSIS — R0902 Hypoxemia: Secondary | ICD-10-CM | POA: Diagnosis present

## 2014-02-07 DIAGNOSIS — J9600 Acute respiratory failure, unspecified whether with hypoxia or hypercapnia: Secondary | ICD-10-CM

## 2014-02-07 DIAGNOSIS — K219 Gastro-esophageal reflux disease without esophagitis: Secondary | ICD-10-CM | POA: Insufficient documentation

## 2014-02-07 DIAGNOSIS — F32A Depression, unspecified: Secondary | ICD-10-CM | POA: Insufficient documentation

## 2014-02-07 DIAGNOSIS — F17200 Nicotine dependence, unspecified, uncomplicated: Secondary | ICD-10-CM | POA: Diagnosis present

## 2014-02-07 DIAGNOSIS — R0609 Other forms of dyspnea: Secondary | ICD-10-CM | POA: Diagnosis present

## 2014-02-07 DIAGNOSIS — G629 Polyneuropathy, unspecified: Secondary | ICD-10-CM | POA: Insufficient documentation

## 2014-02-07 DIAGNOSIS — Z79899 Other long term (current) drug therapy: Secondary | ICD-10-CM | POA: Diagnosis not present

## 2014-02-07 LAB — BLOOD GAS, ARTERIAL
ACID-BASE EXCESS: 3.5 mmol/L — AB (ref 0.0–2.0)
Bicarbonate: 28.7 mEq/L — ABNORMAL HIGH (ref 20.0–24.0)
DRAWN BY: 22223
O2 CONTENT: 3 L/min
O2 SAT: 90.6 %
PO2 ART: 69.4 mmHg — AB (ref 80.0–100.0)
Patient temperature: 37
TCO2: 25.6 mmol/L (ref 0–100)
pCO2 arterial: 53.7 mmHg — ABNORMAL HIGH (ref 35.0–45.0)
pH, Arterial: 7.347 — ABNORMAL LOW (ref 7.350–7.450)

## 2014-02-07 LAB — URINALYSIS, ROUTINE W REFLEX MICROSCOPIC
Bilirubin Urine: NEGATIVE
Glucose, UA: 500 mg/dL — AB
Hgb urine dipstick: NEGATIVE
Ketones, ur: NEGATIVE mg/dL
Leukocytes, UA: NEGATIVE
NITRITE: NEGATIVE
PROTEIN: NEGATIVE mg/dL
SPECIFIC GRAVITY, URINE: 1.02 (ref 1.005–1.030)
Urobilinogen, UA: 0.2 mg/dL (ref 0.0–1.0)
pH: 6 (ref 5.0–8.0)

## 2014-02-07 LAB — CBC WITH DIFFERENTIAL/PLATELET
Basophils Absolute: 0 10*3/uL (ref 0.0–0.1)
Basophils Relative: 0 % (ref 0–1)
EOS ABS: 0.1 10*3/uL (ref 0.0–0.7)
EOS PCT: 1 % (ref 0–5)
HEMATOCRIT: 43.2 % (ref 36.0–46.0)
Hemoglobin: 14.6 g/dL (ref 12.0–15.0)
LYMPHS ABS: 1.8 10*3/uL (ref 0.7–4.0)
Lymphocytes Relative: 17 % (ref 12–46)
MCH: 33.3 pg (ref 26.0–34.0)
MCHC: 33.8 g/dL (ref 30.0–36.0)
MCV: 98.6 fL (ref 78.0–100.0)
MONO ABS: 0.8 10*3/uL (ref 0.1–1.0)
MONOS PCT: 8 % (ref 3–12)
Neutro Abs: 7.9 10*3/uL — ABNORMAL HIGH (ref 1.7–7.7)
Neutrophils Relative %: 74 % (ref 43–77)
PLATELETS: 195 10*3/uL (ref 150–400)
RBC: 4.38 MIL/uL (ref 3.87–5.11)
RDW: 12.9 % (ref 11.5–15.5)
WBC: 10.6 10*3/uL — ABNORMAL HIGH (ref 4.0–10.5)

## 2014-02-07 LAB — COMPREHENSIVE METABOLIC PANEL
ALBUMIN: 3.3 g/dL — AB (ref 3.5–5.2)
ALT: 17 U/L (ref 0–35)
ALT: 17 U/L (ref 0–35)
ANION GAP: 11 (ref 5–15)
AST: 17 U/L (ref 0–37)
AST: 16 U/L (ref 0–37)
Albumin: 3.2 g/dL — ABNORMAL LOW (ref 3.5–5.2)
Alkaline Phosphatase: 68 U/L (ref 39–117)
Alkaline Phosphatase: 70 U/L (ref 39–117)
Anion gap: 11 (ref 5–15)
BILIRUBIN TOTAL: 0.4 mg/dL (ref 0.3–1.2)
BUN: 9 mg/dL (ref 6–23)
BUN: 8 mg/dL (ref 6–23)
CALCIUM: 8.7 mg/dL (ref 8.4–10.5)
CHLORIDE: 97 meq/L (ref 96–112)
CO2: 29 mEq/L (ref 19–32)
CO2: 29 meq/L (ref 19–32)
CREATININE: 0.49 mg/dL — AB (ref 0.50–1.10)
CREATININE: 0.6 mg/dL (ref 0.50–1.10)
Calcium: 8.8 mg/dL (ref 8.4–10.5)
Chloride: 96 mEq/L (ref 96–112)
GFR calc Af Amer: >90 mL/min (ref >90)
GFR calc non Af Amer: >90 mL/min (ref >90)
GFR calc non Af Amer: >90 mL/min (ref >90)
Glucose, Bld: 289 mg/dL — ABNORMAL HIGH (ref 70–99)
Glucose, Bld: 244 mg/dL — ABNORMAL HIGH (ref 70–99)
Potassium: 4.9 mEq/L (ref 3.7–5.3)
Potassium: 4.5 mEq/L (ref 3.7–5.3)
Sodium: 136 mEq/L — ABNORMAL LOW (ref 137–147)
Sodium: 137 mEq/L (ref 137–147)
TOTAL PROTEIN: 6.6 g/dL (ref 6.0–8.3)
TOTAL PROTEIN: 6.7 g/dL (ref 6.0–8.3)
Total Bilirubin: 0.3 mg/dL (ref 0.3–1.2)

## 2014-02-07 LAB — LACTIC ACID, PLASMA: LACTIC ACID, VENOUS: 1.6 mmol/L (ref 0.5–2.2)

## 2014-02-07 LAB — CBC
HEMATOCRIT: 43.3 % (ref 36.0–46.0)
Hemoglobin: 14.3 g/dL (ref 12.0–15.0)
MCH: 32.7 pg (ref 26.0–34.0)
MCHC: 33 g/dL (ref 30.0–36.0)
MCV: 99.1 fL (ref 78.0–100.0)
Platelets: 195 10*3/uL (ref 150–400)
RBC: 4.37 MIL/uL (ref 3.87–5.11)
RDW: 12.9 % (ref 11.5–15.5)
WBC: 10.2 10*3/uL (ref 4.0–10.5)

## 2014-02-07 LAB — GLUCOSE, CAPILLARY: Glucose-Capillary: 282 mg/dL — ABNORMAL HIGH (ref 70–99)

## 2014-02-07 MED ORDER — LEVOFLOXACIN 500 MG PO TABS: 500.0000 mg | ORAL_TABLET | Freq: Every day | ORAL | Status: DC

## 2014-02-07 MED ORDER — ENOXAPARIN SODIUM 40 MG/0.4ML ~~LOC~~ SOLN
40.0000 mg | SUBCUTANEOUS | Status: DC
  Administered 2014-02-07: 40 mg via SUBCUTANEOUS
  Filled 2014-02-07: qty 0.4

## 2014-02-07 MED ORDER — ENOXAPARIN SODIUM 40 MG/0.4ML ~~LOC~~ SOLN: 40.0000 mg | SUBCUTANEOUS | Status: DC

## 2014-02-07 MED ORDER — FLUTICASONE PROPIONATE 50 MCG/ACT NA SUSP: 1.0000 | Freq: Two times a day (BID) | NASAL | Status: DC

## 2014-02-07 MED ORDER — ACETAMINOPHEN 650 MG RE SUPP: 650.0000 mg | Freq: Four times a day (QID) | RECTAL | Status: DC | PRN

## 2014-02-07 MED ORDER — ALBUTEROL SULFATE (2.5 MG/3ML) 0.083% IN NEBU: 2.5000 mg | INHALATION_SOLUTION | Freq: Four times a day (QID) | RESPIRATORY_TRACT | Status: AC | PRN

## 2014-02-07 MED ORDER — ALUM & MAG HYDROXIDE-SIMETH 200-200-20 MG/5ML PO SUSP: 30.0000 mL | Freq: Four times a day (QID) | ORAL | Status: DC | PRN

## 2014-02-07 MED ORDER — GABAPENTIN 300 MG PO CAPS
300.0000 mg | ORAL_CAPSULE | Freq: Two times a day (BID) | ORAL | Status: DC
  Administered 2014-02-07: 300 mg via ORAL
  Filled 2014-02-07: qty 1

## 2014-02-07 MED ORDER — LORATADINE 10 MG PO TABS
10.0000 mg | ORAL_TABLET | Freq: Every day | ORAL | Status: DC
  Administered 2014-02-07: 10 mg via ORAL
  Filled 2014-02-07: qty 1

## 2014-02-07 MED ORDER — BUPROPION HCL ER (XL) 150 MG PO TB24
150.0000 mg | ORAL_TABLET | Freq: Two times a day (BID) | ORAL | Status: DC
  Administered 2014-02-07: 150 mg via ORAL
  Filled 2014-02-07 (×5): qty 1

## 2014-02-07 MED ORDER — LISINOPRIL 10 MG PO TABS
20.0000 mg | ORAL_TABLET | Freq: Every day | ORAL | Status: DC
  Administered 2014-02-07: 20 mg via ORAL
  Filled 2014-02-07: qty 2

## 2014-02-07 MED ORDER — METFORMIN HCL 850 MG PO TABS
850.0000 mg | ORAL_TABLET | Freq: Three times a day (TID) | ORAL | Status: DC
  Administered 2014-02-07 (×3): 850 mg via ORAL
  Filled 2014-02-07 (×8): qty 1

## 2014-02-07 MED ORDER — ALBUTEROL SULFATE (2.5 MG/3ML) 0.083% IN NEBU: 2.5000 mg | INHALATION_SOLUTION | RESPIRATORY_TRACT | Status: DC | PRN

## 2014-02-07 MED ORDER — DOCUSATE SODIUM 100 MG PO CAPS: 100.0000 mg | ORAL_CAPSULE | Freq: Every day | ORAL | Status: DC | PRN

## 2014-02-07 MED ORDER — DICLOFENAC SODIUM 75 MG PO TBEC
75.0000 mg | DELAYED_RELEASE_TABLET | Freq: Every day | ORAL | Status: DC
  Administered 2014-02-07: 75 mg via ORAL
  Filled 2014-02-07 (×3): qty 1

## 2014-02-07 MED ORDER — IPRATROPIUM BROMIDE 0.02 % IN SOLN: 0.5000 mg | Freq: Four times a day (QID) | RESPIRATORY_TRACT | Status: DC

## 2014-02-07 MED ORDER — IPRATROPIUM-ALBUTEROL 0.5-2.5 (3) MG/3ML IN SOLN
3.0000 mL | Freq: Once | RESPIRATORY_TRACT | Status: AC
  Administered 2014-02-07: 3 mL via RESPIRATORY_TRACT
  Filled 2014-02-07: qty 3

## 2014-02-07 MED ORDER — ASPIRIN EC 325 MG PO TBEC
325.0000 mg | DELAYED_RELEASE_TABLET | Freq: Every day | ORAL | Status: DC
  Administered 2014-02-07: 325 mg via ORAL
  Filled 2014-02-07: qty 1

## 2014-02-07 MED ORDER — ACETAMINOPHEN 325 MG PO TABS: 650.0000 mg | ORAL_TABLET | Freq: Four times a day (QID) | ORAL | Status: DC | PRN

## 2014-02-07 MED ORDER — ONDANSETRON HCL 4 MG PO TABS: 4.0000 mg | ORAL_TABLET | Freq: Three times a day (TID) | ORAL | Status: DC | PRN

## 2014-02-07 MED ORDER — AMITRIPTYLINE HCL 25 MG PO TABS: 25.0000 mg | ORAL_TABLET | Freq: Every day | ORAL | Status: DC

## 2014-02-07 MED ORDER — PANTOPRAZOLE SODIUM 40 MG PO TBEC
40.0000 mg | DELAYED_RELEASE_TABLET | Freq: Every day | ORAL | Status: DC
  Administered 2014-02-07: 40 mg via ORAL
  Filled 2014-02-07: qty 1

## 2014-02-07 MED ORDER — GUAIFENESIN-DM 100-10 MG/5ML PO SYRP: 5.0000 mL | ORAL_SOLUTION | ORAL | Status: DC | PRN

## 2014-02-07 MED ORDER — PREDNISONE 10 MG PO TABS: ORAL_TABLET | ORAL | Status: DC

## 2014-02-07 MED ORDER — OXYCODONE-ACETAMINOPHEN 5-325 MG PO TABS
2.0000 | ORAL_TABLET | ORAL | Status: DC | PRN
  Administered 2014-02-07 (×2): 2 via ORAL
  Filled 2014-02-07 (×2): qty 2

## 2014-02-07 MED ORDER — ROPINIROLE HCL 0.25 MG PO TABS
0.2500 mg | ORAL_TABLET | Freq: Every evening | ORAL | Status: DC
  Administered 2014-02-07: 0.25 mg via ORAL
  Filled 2014-02-07 (×2): qty 1

## 2014-02-07 MED ORDER — IPRATROPIUM-ALBUTEROL 0.5-2.5 (3) MG/3ML IN SOLN
3.0000 mL | Freq: Four times a day (QID) | RESPIRATORY_TRACT | Status: DC
  Administered 2014-02-07 (×3): 3 mL via RESPIRATORY_TRACT
  Filled 2014-02-07 (×3): qty 3

## 2014-02-07 MED ORDER — ALBUTEROL SULFATE (2.5 MG/3ML) 0.083% IN NEBU: 2.5000 mg | INHALATION_SOLUTION | Freq: Four times a day (QID) | RESPIRATORY_TRACT | Status: DC

## 2014-02-07 MED ORDER — METHYLPREDNISOLONE SODIUM SUCC 125 MG IJ SOLR
125.0000 mg | INTRAMUSCULAR | Status: AC
  Administered 2014-02-07: 125 mg via INTRAVENOUS
  Filled 2014-02-07: qty 2

## 2014-02-07 MED ORDER — METHYLPREDNISOLONE SODIUM SUCC 125 MG IJ SOLR
60.0000 mg | Freq: Every day | INTRAMUSCULAR | Status: DC
  Administered 2014-02-07: 60 mg via INTRAVENOUS
  Filled 2014-02-07: qty 2

## 2014-02-07 NOTE — Discharge Instructions (Signed)

## 2014-02-07 NOTE — ED Notes (Signed)
Patient removed from oxygen per Dr Preston FleetingGlick. Patient's O2 saturation dropped to 79% on room air. Patient lying in bed sleeping at this time. MD ordered to place patient on oxygen 2L via nasal canula. Placed patient back on 2L oxygen. O2 saturation increased to 90%. Patient remains asleep in bed at this time.

## 2014-02-07 NOTE — Progress Notes (Signed)
Utilization Review Completed.Lasheba Stevens T8/15/2015  

## 2014-02-07 NOTE — ED Provider Notes (Signed)
CSN: [id number]     Arrival date & time 02/06/14  2336 History   First MD Initiated Contact with Patient 02/06/14 2359     Chief Complaint  Patient presents with  . Altered Mental Status     (Consider location/radiation/quality/duration/timing/severity/associated sxs/prior Treatment) Patient is a 58 y.o. female presenting with altered mental status. The history is provided by a relative and the patient. The history is limited by the condition of the patient (Altered mental status).  Altered Mental Status She had surgery on her right shoulder on August 13 and has been taking oxycodone and acetaminophen for pain. She woke up at about 10:30 PM and is very confused. She was not recognizing family members and seemed to be more groggy than she should be. She is somewhat more awake in the ED but not back to her baseline. Family seems that she has a history of COPD and the do here her wheezing when she is sleeping but she has not on any medication for that and as far as I know she has never been admitted to the hospital for that. She is a cigarette smoker.   Past Medical History  Diagnosis Date  . Diabetes mellitus without complication   . Hypertension   . Depression   . Seasonal allergies    Past Surgical History  Procedure Laterality Date  . Hand surgery    . Shoulder arthroscopy with rotator cuff repair     No family history on file. History  Substance Use Topics  . Smoking status: Current Every Day Smoker  . Smokeless tobacco: Not on file  . Alcohol Use: No   OB History   Grav Para Term Preterm Abortions TAB SAB Ect Mult Living                 Review of Systems  Unable to perform ROS: Mental status change      Allergies  Review of patient's allergies indicates no known allergies.  Home Medications   Prior to Admission medications   Medication Sig Start Date End Date Taking? Authorizing Provider  amitriptyline (ELAVIL) 25 MG tablet Take 25 mg by mouth at bedtime.   Yes  Historical Provider, MD  aspirin EC 325 MG tablet Take 325 mg by mouth daily.   Yes Historical Provider, MD  buPROPion (WELLBUTRIN XL) 150 MG 24 hr tablet Take 150 mg by mouth 2 (two) times daily.    Yes Historical Provider, MD  cetirizine (ZYRTEC) 10 MG tablet Take 10 mg by mouth daily.   Yes Historical Provider, MD  fluticasone (FLONASE) 50 MCG/ACT nasal spray Place 1 spray into both nostrils 2 (two) times daily. 01/05/14  Yes Historical Provider, MD  gabapentin (NEURONTIN) 300 MG capsule Take 300 mg by mouth 2 (two) times daily.   Yes Historical Provider, MD  lisinopril (PRINIVIL,ZESTRIL) 20 MG tablet Take 20 mg by mouth daily.   Yes Historical Provider, MD  metFORMIN (GLUCOPHAGE) 850 MG tablet Take 850 mg by mouth 3 (three) times daily.   Yes Historical Provider, MD  Multiple Vitamin (MULTIVITAMIN) capsule Take 1 capsule by mouth daily.   Yes Historical Provider, MD  omeprazole (PRILOSEC) 20 MG capsule Take 20 mg by mouth daily.   Yes Historical Provider, MD  ondansetron (ZOFRAN) 4 MG tablet Take 4 mg by mouth every 8 (eight) hours as needed for nausea or vomiting.   Yes Historical Provider, MD  oxyCODONE-acetaminophen (PERCOCET) 10-325 MG per tablet Take 1 tablet by mouth every 4 (four) hours as  needed for pain.   Yes Historical Provider, MD  rOPINIRole (REQUIP) 0.25 MG tablet Take 0.25 mg by mouth every evening.    Yes Historical Provider, MD  diclofenac (VOLTAREN) 75 MG EC tablet Take 75 mg by mouth daily.     Historical Provider, MD   BP 104/57  Pulse 118  Temp(Src) 100.5 F (38.1 C) (Oral)  Resp 16  Ht 5\' 1"  (1.549 m)  Wt 190 lb (86.183 kg)  BMI 35.92 kg/m2  SpO2 97% Physical Exam  Nursing note and vitals reviewed.  58 year old female, resting comfortably and in no acute distress. Vital signs are significant for fever and tachycardia. Oxygen saturation is 97%, which is normal. Head is normocephalic and atraumatic. PERRLA, EOMI. Oropharynx is clear. Neck is nontender and supple  without adenopathy or JVD. Back is nontender and there is no CVA tenderness. Lungs have diffuse, coarse inspiratory and expiratory rhonchi. Chest is nontender. Heart has regular rate and rhythm without murmur. Abdomen is soft, flat, nontender without masses or hepatosplenomegaly and peristalsis is normoactive. Extremities: Dressing is present on right shoulder and is not removed. There is no cyanosis or edema, full range of motion is present of all other joints. Skin is warm and dry without rash. Neurologic: She is awake and alert and oriented to person and place but not time, cranial nerves are intact, there are no motor or sensory deficits.  ED Course  Procedures (including critical care time) Labs Review Results for orders placed during the hospital encounter of 02/06/14  CULTURE, BLOOD (ROUTINE X 2)      Result Value Ref Range   Specimen Description Blood BLOOD LEFT HAND     Special Requests BOTTLES DRAWN AEROBIC AND ANAEROBIC 7CC     Culture PENDING     Report Status PENDING    CULTURE, BLOOD (ROUTINE X 2)      Result Value Ref Range   Specimen Description Blood BLOOD RIGHT HAND     Special Requests BOTTLES DRAWN AEROBIC AND ANAEROBIC 9CC     Culture PENDING     Report Status PENDING    BLOOD GAS, ARTERIAL      Result Value Ref Range   O2 Content 3.0     Delivery systems NASAL CANNULA     pH, Arterial 7.347 (*) 7.350 - 7.450   pCO2 arterial 53.7 (*) 35.0 - 45.0 mmHg   pO2, Arterial 69.4 (*) 80.0 - 100.0 mmHg   Bicarbonate 28.7 (*) 20.0 - 24.0 mEq/L   TCO2 25.6  0 - 100 mmol/L   Acid-Base Excess 3.5 (*) 0.0 - 2.0 mmol/L   O2 Saturation 90.6     Patient temperature 37.0     Collection site LEFT RADIAL     Drawn by [id number]     Sample type ARTERIAL     Allens test (pass/fail) PASS  PASS  CBC WITH DIFFERENTIAL      Result Value Ref Range   WBC 10.6 (*) 4.0 - 10.5 K/uL   RBC 4.38  3.87 - 5.11 MIL/uL   Hemoglobin 14.6  12.0 - 15.0 g/dL   HCT 09.843.2  11.936.0 - 14.746.0 %   MCV 98.6   78.0 - 100.0 fL   MCH 33.3  26.0 - 34.0 pg   MCHC 33.8  30.0 - 36.0 g/dL   RDW 82.912.9  56.211.5 - 13.015.5 %   Platelets 195  150 - 400 K/uL   Neutrophils Relative % 74  43 - 77 %   Neutro  Abs 7.9 (*) 1.7 - 7.7 K/uL   Lymphocytes Relative 17  12 - 46 %   Lymphs Abs 1.8  0.7 - 4.0 K/uL   Monocytes Relative 8  3 - 12 %   Monocytes Absolute 0.8  0.1 - 1.0 K/uL   Eosinophils Relative 1  0 - 5 %   Eosinophils Absolute 0.1  0.0 - 0.7 K/uL   Basophils Relative 0  0 - 1 %   Basophils Absolute 0.0  0.0 - 0.1 K/uL  COMPREHENSIVE METABOLIC PANEL      Result Value Ref Range   Sodium 136 (*) 137 - 147 mEq/L   Potassium 4.9  3.7 - 5.3 mEq/L   Chloride 96  96 - 112 mEq/L   CO2 29  19 - 32 mEq/L   Glucose, Bld 289 (*) 70 - 99 mg/dL   BUN 9  6 - 23 mg/dL   Creatinine, Ser 1.61  0.50 - 1.10 mg/dL   Calcium 8.7  8.4 - 09.6 mg/dL   Total Protein 6.6  6.0 - 8.3 g/dL   Albumin 3.2 (*) 3.5 - 5.2 g/dL   AST 17  0 - 37 U/L   ALT 17  0 - 35 U/L   Alkaline Phosphatase 68  39 - 117 U/L   Total Bilirubin 0.3  0.3 - 1.2 mg/dL   GFR calc non Af Amer >90  >90 mL/min   GFR calc Af Amer >90  >90 mL/min   Anion gap 11  5 - 15  URINALYSIS, ROUTINE W REFLEX MICROSCOPIC      Result Value Ref Range   Color, Urine YELLOW  YELLOW   APPearance CLEAR  CLEAR   Specific Gravity, Urine 1.020  1.005 - 1.030   pH 6.0  5.0 - 8.0   Glucose, UA 500 (*) NEGATIVE mg/dL   Hgb urine dipstick NEGATIVE  NEGATIVE   Bilirubin Urine NEGATIVE  NEGATIVE   Ketones, ur NEGATIVE  NEGATIVE mg/dL   Protein, ur NEGATIVE  NEGATIVE mg/dL   Urobilinogen, UA 0.2  0.0 - 1.0 mg/dL   Nitrite NEGATIVE  NEGATIVE   Leukocytes, UA NEGATIVE  NEGATIVE  LACTIC ACID, PLASMA      Result Value Ref Range   Lactic Acid, Venous 1.6  0.5 - 2.2 mmol/L   Imaging Review Dg Chest Port 1 View  02/07/2014   CLINICAL DATA:  Shortness of breath.  EXAM: PORTABLE CHEST - 1 VIEW  COMPARISON:  01/03/2013  FINDINGS: Heart is upper limits normal in size. Minimal bibasilar  atelectasis. Mild interstitial prominence. No effusions. No acute bony abnormality.  IMPRESSION: Minimal bibasilar atelectasis with slight increase in interstitial prominence throughout the lungs. This may reflect chronic interstitial lung disease.   Electronically Signed   By: Charlett Nose M.D.   On: 02/07/2014 00:58   MDM   Final diagnoses:  Altered mental status, unspecified altered mental status type  Hypercarbia    Altered mentation which may be related to narcotic use. Consider exacerbation of COPD and consider occult infection. Chest x-ray and urinalysis will be obtained into arterial blood gas obtained to rule out CO2 retention. She will be given a bureau with ipratropium by nebulizer. Old records are reviewed and she has no previous visits for respiratory issues.  After nebulizer treatment, there is less rhonchi and some faint wheezes were heard. She is noted to have oxygen saturations of 90-92% while on 2 L of nasal oxygen. She is given a second nebulizer treatment with little change.  Her son states that he thinks he is back to her baseline although still more boggy than she normally would be. She was taken off of oxygen and is noted to have desaturation down to 78%. ABG shows CO2 retention with very mild respiratory acidosis indicating that she has chronic CT retention. She is not on any medications for her lungs. I'm concerned about her degree of hypoxia and CO2 retention and elevated impacted by her narcotic painkillers. Decision is made to admit her to the hospital. Case is discussed with Dr. Adrian Blackwater of [facility] who agrees to admit the patient.  Dione Booze, MD 02/07/14 [phone]

## 2014-02-07 NOTE — Progress Notes (Signed)
Pt is to be discharged home with home health today. Pt is in no acute distress, all paperwork has been reviewed/discussed with patient, and there are no questions/concerns at this time. Assessment is unchanged from this morning. Pt is to be accompanied downstairs by staff and family via wheelchair. [facility] was the elected agency by the patient. They have received the pt's discharge summary, facesheet, order for equipment, order for oxygen, and they have been called about pt's discharge. Awaiting arrival of home O2 tank. Will continue to monitor.

## 2014-02-07 NOTE — Discharge Summary (Signed)
Physician Discharge Summary  Victoria Lara WUJ:[id number] DOB: 1956/01/11 DOA: 02/06/2014  PCP: Estanislado Pandy, MD  Admit date: 02/06/2014 Discharge date: 02/07/2014  Time spent: 40 minutes  Recommendations for Outpatient Follow-up:  1. Patient will be discharged home on 2 L of oxygen which will need to be reassessed by her primary care physician. 2. Recommended patient have outpatient sleep study to evaluate for obstructive sleep apnea. She'll also benefit from formal pulmonary function tests. 3. Follow up with orthopedics as previously scheduled  Discharge Diagnoses:  Principal Problem:   COPD exacerbation Active Problems:   Hypoxia   Altered mental status   Confused Acute respiratory failure with hypoxia Polypharmacy  Discharge Condition: Improved  Diet recommendation: Low salt  Filed Weights   02/07/14 0005 02/07/14 0515  Weight: 86.183 kg (190 lb) 86.183 kg (190 lb)    History of present illness and hospital course:  This patient was brought to the hospital by her family with lethargy and altered mental status. She is on several sedative medications including amitriptyline, gabapentin and Requip. She recently had shoulder surgery done on 8/13. She had been prescribed Percocet to take at home. Her family reports that since she's been on Percocet, she was noted to be increasingly somnolent. She was brought to the ER for evaluation where she was also noted to be significantly wheezing bilaterally. She does not carry a formal diagnosis of COPD, but has been on long-time smoker. She received nebulizer treatments as well as intravenous steroids. Her respiratory status began to improve. She still requires 2 L of oxygen to maintain his saturations greater than 90%.   Regarding her mental status, this appears to be back to baseline. It was felt this was likely related to polypharmacy in the setting of CO2 retention/hypoxia. He was advised to be very cautious in taking any further  narcotic medications for pain. Her family reports that she was also prescribed baclofen. The patient was educated that baclofen also has sedative properties and will need to be taken with caution. Her family reports concerns for possible sleep apnea. He feels that she snores heavily, is often drowsy during the day, and may have apneic episodes at night. With her mildly elevated PCO2, pursuing a sleep study would not be unreasonable. This would need to be done in the outpatient setting. We'll also recommend that she have formal pulmonary function test done once her acute issues have resolved. The patient is extremely adamant about being discharged home today. Her mental status appears to back to baseline she does not appear to be significantly short of breath. She is requiring 2 L of oxygen to maintain her saturations greater than 90%. She'll be discharged home on a prednisone taper, a course of antibiotics as well as nebulizer treatments. She plans to followup with her primary care physician next 1-2 weeks. She has a followup with her orthopedist in the coming week. She strongly cautioned him using further narcotics/baclofen to manage her pain. These should certainly be held in the setting of drowsiness/lethargy.  Procedures:    Consultations:    Discharge Exam: Filed Vitals:   02/07/14 1417  BP: 117/54  Pulse: 101  Temp: 98.8 F (37.1 C)  Resp: 20    General:NAD Cardiovascular: s1, s2, rrr Respiratory: occasional rhonchi, no wheezes, fair air entry bilaterally  Discharge Instructions You were cared for by a hospitalist during your hospital stay. If you have any questions about your discharge medications or the care you received while you were in the hospital  after you are discharged, you can call the unit and asked to speak with the hospitalist on call if the hospitalist that took care of you is not available. Once you are discharged, your primary care physician will handle any further  medical issues. Please note that NO REFILLS for any discharge medications will be authorized once you are discharged, as it is imperative that you return to your primary care physician (or establish a relationship with a primary care physician if you do not have one) for your aftercare needs so that they can reassess your need for medications and monitor your lab values.  Discharge Instructions   Call MD for:  difficulty breathing, headache or visual disturbances    Complete by:  As directed      Call MD for:  extreme fatigue    Complete by:  As directed      Call MD for:  temperature >100.4    Complete by:  As directed      DME Nebulizer machine    Complete by:  As directed      Diet - low sodium heart healthy    Complete by:  As directed      Face-to-face encounter (required for Medicare/Medicaid patients)    Complete by:  As directed   I MEMON,JEHANZEB certify that this patient is under my care and that I, or a nurse practitioner or physician's assistant working with me, had a face-to-face encounter that meets the physician face-to-face encounter requirements with this patient on 02/07/2014. The encounter with the patient was in whole, or in part for the following medical condition(s) which is the primary reason for home health care (List medical condition): admitted with shortness of breath and lethargy.  Would benefit from home health RN for disease management  The encounter with the patient was in whole, or in part, for the following medical condition, which is the primary reason for home health care:  copd exacerbation  I certify that, based on my findings, the following services are medically necessary home health services:  Nursing  My clinical findings support the need for the above services:  Shortness of breath with activity  Further, I certify that my clinical findings support that this patient is homebound due to:  Shortness of Breath with activity  Reason for Medically Necessary Home  Health Services:  Skilled Nursing- Teaching of Disease Process/Symptom Management     For home use only DME oxygen    Complete by:  As directed   Mode or (Route):  Nasal cannula  Liters per Minute:  2  Frequency:  Continuous (stationary and portable oxygen unit needed)  Oxygen conserving device:  Yes  Oxygen delivery system:  Gas     Home Health    Complete by:  As directed   To provide the following care/treatments:  RN     Increase activity slowly    Complete by:  As directed             Medication List         albuterol (2.5 MG/3ML) 0.083% nebulizer solution  Commonly known as:  PROVENTIL  Take 3 mLs (2.5 mg total) by nebulization every 6 (six) hours as needed for wheezing.     amitriptyline 25 MG tablet  Commonly known as:  ELAVIL  Take 25 mg by mouth at bedtime.     aspirin EC 325 MG tablet  Take 325 mg by mouth daily.     buPROPion 150 MG 24 hr  tablet  Commonly known as:  WELLBUTRIN XL  Take 150 mg by mouth 2 (two) times daily.     cetirizine 10 MG tablet  Commonly known as:  ZYRTEC  Take 10 mg by mouth daily.     fluticasone 50 MCG/ACT nasal spray  Commonly known as:  FLONASE  Place 1 spray into both nostrils 2 (two) times daily.     gabapentin 300 MG capsule  Commonly known as:  NEURONTIN  Take 300 mg by mouth 2 (two) times daily.     levofloxacin 500 MG tablet  Commonly known as:  LEVAQUIN  Take 1 tablet (500 mg total) by mouth daily.     lisinopril 20 MG tablet  Commonly known as:  PRINIVIL,ZESTRIL  Take 20 mg by mouth daily.     metFORMIN 850 MG tablet  Commonly known as:  GLUCOPHAGE  Take 850 mg by mouth 3 (three) times daily.     multivitamin capsule  Take 1 capsule by mouth daily.     omeprazole 20 MG capsule  Commonly known as:  PRILOSEC  Take 20 mg by mouth daily.     predniSONE 10 MG tablet  Commonly known as:  DELTASONE  Take 40mg  po daily for 2 days then 30mg  po daily for 2 days then 20mg  po daily for 2 days then 10mg  po daily  for 2 days then stop     rOPINIRole 0.25 MG tablet  Commonly known as:  REQUIP  Take 0.25 mg by mouth every evening.       No Known Allergies     Follow-up Information   Follow up with Estanislado Pandy, MD. Schedule an appointment as soon as possible for a visit in 2 weeks.   Specialty:  Cardiology   Contact information:   [address] [place] [address] [phone]        The results of significant diagnostics from this hospitalization (including imaging, microbiology, ancillary and laboratory) are listed below for reference.    Significant Diagnostic Studies: Dg Chest Port 1 View  02/07/2014   CLINICAL DATA:  Shortness of breath.  EXAM: PORTABLE CHEST - 1 VIEW  COMPARISON:  01/03/2013  FINDINGS: Heart is upper limits normal in size. Minimal bibasilar atelectasis. Mild interstitial prominence. No effusions. No acute bony abnormality.  IMPRESSION: Minimal bibasilar atelectasis with slight increase in interstitial prominence throughout the lungs. This may reflect chronic interstitial lung disease.   Electronically Signed   By: Charlett Nose M.D.   On: 02/07/2014 00:58   Dg Knee Complete 4 Views Left  01/17/2014   CLINICAL DATA:  58 year old female with knee injury and pain.  EXAM: LEFT KNEE - COMPLETE 4+ VIEW  COMPARISON:  None.  FINDINGS: There is no evidence of fracture, subluxation or dislocation.  No definite knee effusion is present.  A few small calcifications along the anterior and posterior aspect of the joint may represent loose bodies.  Very mild tricompartmental degenerative changes are noted.  No focal bony abnormalities are identified.  IMPRESSION: No evidence of acute bony abnormality.  Possible loose bodies within the joint.  Very mild tricompartmental degenerative changes.   Electronically Signed   By: Laveda Abbe M.D.   On: 01/17/2014 17:15    Microbiology: Recent Results (from the past 240 hour(s))  CULTURE, BLOOD (ROUTINE X 2)     Status: None   Collection Time     02/07/14  1:06 AM      Result Value Ref Range Status  Specimen Description Blood BLOOD LEFT HAND   Final   Special Requests BOTTLES DRAWN AEROBIC AND ANAEROBIC 7CC   Final   Culture NO GROWTH <24 HRS   Final   Report Status PENDING   Incomplete  CULTURE, BLOOD (ROUTINE X 2)     Status: None   Collection Time    02/07/14  1:16 AM      Result Value Ref Range Status   Specimen Description Blood BLOOD RIGHT HAND   Final   Special Requests BOTTLES DRAWN AEROBIC AND ANAEROBIC 9CC   Final   Culture NO GROWTH <24 HRS   Final   Report Status PENDING   Incomplete     Labs: Basic Metabolic Panel:  Recent Labs Lab 02/07/14 0041 02/07/14 0603  NA 136* 137  K 4.9 4.5  CL 96 97  CO2 29 29  GLUCOSE 289* 244*  BUN 9 8  CREATININE 0.60 0.49*  CALCIUM 8.7 8.8   Liver Function Tests:  Recent Labs Lab 02/07/14 0041 02/07/14 0603  AST 17 16  ALT 17 17  ALKPHOS 68 70  BILITOT 0.3 0.4  PROT 6.6 6.7  ALBUMIN 3.2* 3.3*   No results found for this basename: LIPASE, AMYLASE,  in the last 168 hours No results found for this basename: AMMONIA,  in the last 168 hours CBC:  Recent Labs Lab 02/07/14 0041 02/07/14 0603  WBC 10.6* 10.2  NEUTROABS 7.9*  --   HGB 14.6 14.3  HCT 43.2 43.3  MCV 98.6 99.1  PLT 195 195   Cardiac Enzymes: No results found for this basename: CKTOTAL, CKMB, CKMBINDEX, TROPONINI,  in the last 168 hours BNP: BNP (last 3 results) No results found for this basename: PROBNP,  in the last 8760 hours CBG:  Recent Labs Lab 02/07/14 0856  GLUCAP 282*       Signed:  MEMON,JEHANZEB  [facility] 02/07/2014, 5:55 PM

## 2014-02-07 NOTE — H&P (Signed)
History and Physical  Victoria Lara ZOX:[id number] DOB: [birth year] DOA: 02/06/2014  Referring physician: Preston FleetingGlick, MD PCP: Estanislado PandySASSER,PAUL W, MD   Chief Complaint: Confusion  HPI: Victoria HornsGeraldine J Sunday is a 58 y.o. female  This is a 58 year old female with a history of diabetes type 2, obesity, hypertension, depression, neuropathy, GERD who presents to the hospital emergency department due to altered mental status. Patient was brought to the ED by her son. The patient had her right rotator cuff repaired on 02/05/14 has been prescribed oxycodone for pain. Patient woke up to take her evening medications and appeared very confused, which was witnessed by her son. Her son called his wife, who is a LPN, who came and took her vitals which included a pulse oximetry. Her pulse ox was in the 70's.  Her some called her doctor who recommended evaluation at the ED.  The patient has become more alert while in the hospital. She denies fevers, chills, shortness of breath, with wheezing.she does have a productive cough with white sputum, which has been a little more recently.   Review of Systems:   Patient admits to snoring, apneic episodes.  Pt denies headaches, blurred vision, nausea, vomiting, abdominal pain.  Review of systems are otherwise negative  Past Medical History  Diagnosis Date  . Diabetes mellitus without complication   . Hypertension   . Depression   . Seasonal allergies    Past Surgical History  Procedure Laterality Date  . Hand surgery    . Shoulder arthroscopy with rotator cuff repair     Social History:  reports that she has been smoking.  She does not have any smokeless tobacco history on file. She reports that she does not drink alcohol or use illicit drugs. Patient lives at home & is able to participate in activities of daily living.  No Known Allergies  History reviewed. No pertinent family history.    Prior to Admission medications   Medication Sig Start Date End Date  Taking? Authorizing Provider  amitriptyline (ELAVIL) 25 MG tablet Take 25 mg by mouth at bedtime.   Yes Historical Provider, MD  aspirin EC 325 MG tablet Take 325 mg by mouth daily.   Yes Historical Provider, MD  buPROPion (WELLBUTRIN XL) 150 MG 24 hr tablet Take 150 mg by mouth 2 (two) times daily.    Yes Historical Provider, MD  cetirizine (ZYRTEC) 10 MG tablet Take 10 mg by mouth daily.   Yes Historical Provider, MD  fluticasone (FLONASE) 50 MCG/ACT nasal spray Place 1 spray into both nostrils 2 (two) times daily. 01/05/14  Yes Historical Provider, MD  gabapentin (NEURONTIN) 300 MG capsule Take 300 mg by mouth 2 (two) times daily.   Yes Historical Provider, MD  lisinopril (PRINIVIL,ZESTRIL) 20 MG tablet Take 20 mg by mouth daily.   Yes Historical Provider, MD  metFORMIN (GLUCOPHAGE) 850 MG tablet Take 850 mg by mouth 3 (three) times daily.   Yes Historical Provider, MD  Multiple Vitamin (MULTIVITAMIN) capsule Take 1 capsule by mouth daily.   Yes Historical Provider, MD  omeprazole (PRILOSEC) 20 MG capsule Take 20 mg by mouth daily.   Yes Historical Provider, MD  ondansetron (ZOFRAN) 4 MG tablet Take 4 mg by mouth every 8 (eight) hours as needed for nausea or vomiting.   Yes Historical Provider, MD  oxyCODONE-acetaminophen (PERCOCET) 10-325 MG per tablet Take 1 tablet by mouth every 4 (four) hours as needed for pain.   Yes Historical Provider, MD  rOPINIRole (REQUIP) 0.25 MG tablet  Take 0.25 mg by mouth every evening.    Yes Historical Provider, MD  diclofenac (VOLTAREN) 75 MG EC tablet Take 75 mg by mouth daily.     Historical Provider, MD    Physical Exam: BP 106/64  Pulse 105  Temp(Src) 100.5 F (38.1 C) (Oral)  Resp 16  Ht 5\' 1"  (1.549 m)  Wt 86.183 kg (190 lb)  BMI 35.92 kg/m2  SpO2 94%  General:  In general this is a welder Caucasian female who is awake and alert and oriented x3. She is not in any acute cardiopulmonary distress.  Eyes: head normocephalic, atraumatic. pupils are  equal, round, and react to light.  Extraocular muscles are intact sclerae anicteric not injected. ENT: external auditory canals are patent.  Tympanic membranes are nonbulging, not retracted, and nonerythematous and reflected conflict. Neck: supple without lymphadenopathy Cardiovascular: regular rate little S1-S2 sounds. No murmurs, rubs, or gallops. No jugular venous distention. Respiratory: mild to moderate wheezing scattered throughout. There is also significant amount of rhonchi. There are no rails noted on exam. There is moderate prolonged exhalation phase and mild diminished breath sounds. Abdomen: soft, nontender, nondistended. Patient is obese. Bowel sounds present throughout. Skin: warm to touch without rashes Musculoskeletal: there is a bandage on the right shoulder and the arm is in a sling. Psychiatric: mood, associations, and judgment are intact. Neurologic: no gross motor deficits observed. Cranial nerves 2 through12 grossly intact.          Labs on Admission:  Basic Metabolic Panel:  Recent Labs Lab 02/07/14 0041  NA 136*  K 4.9  CL 96  CO2 29  GLUCOSE 289*  BUN 9  CREATININE 0.60  CALCIUM 8.7   Liver Function Tests:  Recent Labs Lab 02/07/14 0041  AST 17  ALT 17  ALKPHOS 68  BILITOT 0.3  PROT 6.6  ALBUMIN 3.2*   No results found for this basename: LIPASE, AMYLASE,  in the last 168 hours No results found for this basename: AMMONIA,  in the last 168 hours CBC:  Recent Labs Lab 02/07/14 0041  WBC 10.6*  NEUTROABS 7.9*  HGB 14.6  HCT 43.2  MCV 98.6  PLT 195     Radiological Exams on Admission: Dg Chest Port 1 View  02/07/2014   CLINICAL DATA:  Shortness of breath.  EXAM: PORTABLE CHEST - 1 VIEW  COMPARISON:  01/03/2013  FINDINGS: Heart is upper limits normal in size. Minimal bibasilar atelectasis. Mild interstitial prominence. No effusions. No acute bony abnormality.  IMPRESSION: Minimal bibasilar atelectasis with slight increase in interstitial  prominence throughout the lungs. This may reflect chronic interstitial lung disease.   Electronically Signed   By: Charlett Nose M.D.   On: 02/07/2014 00:58    Assessment/Plan Present on Admission:  . Hypoxia . COPD exacerbation . Altered mental status  #1 hypercarbic hypoxia - This is likely multifactorial - CPD exacerbation, undiagnosed obstructive sleep apnea, respiratory sedation due to narcotics and other medications. Admit and treat underlying COPD exacerbation with steroids, DuoNeb's, guaifenesin. There's no evidence of purulent sputum, therefore I will hold off on antibiotics. Continue with O2 via nasal cannula with continuous pulse oximetry. Titrate O2 to maintain sats greater than 92%. Likely the patient will need to be sent home on O2 given that she has undiagnosed sleep apnea.  #2 COPD exacerbation - Treat as above.  #3 altered mental status - multifactorial and resolving.  Likely due to hypercarbic hypoxia, narcotic use, other sedating medications such as gabapentin, Requip, and amitriptyline.  #  4 diabetes -Continue metformin  #5 hypertension -Continue lisinopril  Code Status: full code  Disposition Plan: return home, likely with O2 following stabilization  Time spent: 70 minutes was spent on the admission of this patient.  Candelaria Celeste, DO [facility] Pager [phone]  **Disclaimer: This note may have been dictated with voice recognition software. Similar sounding words can inadvertently be transcribed and this note may contain transcription errors which may not have been corrected upon publication of note.**

## 2014-02-07 NOTE — Progress Notes (Signed)
Patient received oxygen tank and instructions were given about home O2 from [facility] representative. Patient was in a stable condition and IV was removed. Patient left in wheelchair with daughter-in-law with all of her belongings.

## 2014-02-07 NOTE — Progress Notes (Addendum)
On 2L of O2 via [place] at rest pt is at  98% oxygen saturation  On RA at rest pt is at  83% oxygen saturation. When encouraged to deep breathe, pt is able to increase saturations to 92% on RA.  Standing on RA pt is able to maintain an oxygen saturation of 92%.  Ambulating on RA pt drops to an oxygen saturation of 82%  Ambulating on 2L of oxygen, pt's saturations return to 95%   When returned to rest on 2L of O2 via [place] pt is at  95% oxygen saturation    Will continue to monitor.

## 2014-02-12 LAB — CULTURE, BLOOD (ROUTINE X 2)
Culture: NO GROWTH
Culture: NO GROWTH

## 2014-06-26 DIAGNOSIS — J189 Pneumonia, unspecified organism: Secondary | ICD-10-CM

## 2014-06-26 HISTORY — DX: Pneumonia, unspecified organism: J18.9

## 2014-07-01 DIAGNOSIS — L02215 Cutaneous abscess of perineum: Secondary | ICD-10-CM | POA: Diagnosis not present

## 2014-07-06 DIAGNOSIS — Z9889 Other specified postprocedural states: Secondary | ICD-10-CM | POA: Diagnosis not present

## 2014-07-09 DIAGNOSIS — E119 Type 2 diabetes mellitus without complications: Secondary | ICD-10-CM | POA: Diagnosis not present

## 2014-07-09 DIAGNOSIS — H52223 Regular astigmatism, bilateral: Secondary | ICD-10-CM | POA: Diagnosis not present

## 2014-07-09 DIAGNOSIS — H5213 Myopia, bilateral: Secondary | ICD-10-CM | POA: Diagnosis not present

## 2014-07-09 DIAGNOSIS — H2513 Age-related nuclear cataract, bilateral: Secondary | ICD-10-CM | POA: Diagnosis not present

## 2014-07-10 DIAGNOSIS — J449 Chronic obstructive pulmonary disease, unspecified: Secondary | ICD-10-CM | POA: Diagnosis not present

## 2014-08-10 DIAGNOSIS — J449 Chronic obstructive pulmonary disease, unspecified: Secondary | ICD-10-CM | POA: Diagnosis not present

## 2014-08-17 ENCOUNTER — Encounter (HOSPITAL_COMMUNITY): Payer: Self-pay

## 2014-08-17 ENCOUNTER — Encounter (HOSPITAL_COMMUNITY)
Admission: RE | Admit: 2014-08-17 | Discharge: 2014-08-17 | Disposition: A | Discharge status: Home / Self Care | Payer: Medicare Other | Source: Ambulatory Visit | Attending: Ophthalmology | Admitting: Ophthalmology

## 2014-08-17 DIAGNOSIS — H25811 Combined forms of age-related cataract, right eye: Secondary | ICD-10-CM | POA: Diagnosis not present

## 2014-08-17 DIAGNOSIS — H269 Unspecified cataract: Secondary | ICD-10-CM | POA: Diagnosis present

## 2014-08-17 DIAGNOSIS — E119 Type 2 diabetes mellitus without complications: Secondary | ICD-10-CM | POA: Diagnosis not present

## 2014-08-17 HISTORY — DX: Adverse effect of unspecified anesthetic, initial encounter: T41.45XA

## 2014-08-17 HISTORY — DX: Other complications of anesthesia, initial encounter: T88.59XA

## 2014-08-17 LAB — CBC
HCT: 42.4 % (ref 36.0–46.0)
HEMOGLOBIN: 14.1 g/dL (ref 12.0–15.0)
MCH: 31.5 pg (ref 26.0–34.0)
MCHC: 33.3 g/dL (ref 30.0–36.0)
MCV: 94.6 fL (ref 78.0–100.0)
Platelets: 237 10*3/uL (ref 150–400)
RBC: 4.48 MIL/uL (ref 3.87–5.11)
RDW: 13.4 % (ref 11.5–15.5)
WBC: 9.6 10*3/uL (ref 4.0–10.5)

## 2014-08-17 LAB — BASIC METABOLIC PANEL
ANION GAP: 7 (ref 5–15)
BUN: 20 mg/dL (ref 6–23)
CHLORIDE: 102 mmol/L (ref 96–112)
CO2: 28 mmol/L (ref 19–32)
Calcium: 9.2 mg/dL (ref 8.4–10.5)
Creatinine, Ser: 0.72 mg/dL (ref 0.50–1.10)
GFR calc Af Amer: >90 mL/min (ref >90)
GFR calc non Af Amer: >90 mL/min (ref >90)
Glucose, Bld: 247 mg/dL — ABNORMAL HIGH (ref 70–99)
Potassium: 4.2 mmol/L (ref 3.5–5.1)
Sodium: 137 mmol/L (ref 135–145)

## 2014-08-17 NOTE — Patient Instructions (Signed)
Your procedure is scheduled on: 08/20/2014  Report to [facility] at  630 AM.  Call this number if you have problems the morning of surgery: [phone]   Do not eat food or drink liquids :After Midnight.      Take these medicines the morning of surgery with A SIP OF WATER: wellbutrin, zyrtec, neurontin, lisinopril, prilosec, prednisone, requip   Do not wear jewelry, make-up or nail polish.  Do not wear lotions, powders, or perfumes.   Do not shave 48 hours prior to surgery.  Do not bring valuables to the hospital.  Contacts, dentures or bridgework may not be worn into surgery.  Leave suitcase in the car. After surgery it may be brought to your room.  For patients admitted to the hospital, checkout time is 11:00 AM the day of discharge.   Patients discharged the day of surgery will not be allowed to drive home.  :     Please read over the following fact sheets that you were given: Coughing and Deep Breathing, Surgical Site Infection Prevention, Anesthesia Post-op Instructions and Care and Recovery After Surgery    Cataract A cataract is a clouding of the lens of the eye. When a lens becomes cloudy, vision is reduced based on the degree and nature of the clouding. Many cataracts reduce vision to some degree. Some cataracts make people more near-sighted as they develop. Other cataracts increase glare. Cataracts that are ignored and become worse can sometimes look white. The white color can be seen through the pupil. CAUSES   Aging. However, cataracts may occur at any age, even in newborns.   Certain drugs.   Trauma to the eye.   Certain diseases such as diabetes.   Specific eye diseases such as chronic inflammation inside the eye or a sudden attack of a rare form of glaucoma.   Inherited or acquired medical problems.  SYMPTOMS   Gradual, progressive drop in vision in the affected eye.   Severe, rapid visual loss. This most often happens when trauma is the cause.  DIAGNOSIS  To  detect a cataract, an eye doctor examines the lens. Cataracts are best diagnosed with an exam of the eyes with the pupils enlarged (dilated) by drops.  TREATMENT  For an early cataract, vision may improve by using different eyeglasses or stronger lighting. If that does not help your vision, surgery is the only effective treatment. A cataract needs to be surgically removed when vision loss interferes with your everyday activities, such as driving, reading, or watching TV. A cataract may also have to be removed if it prevents examination or treatment of another eye problem. Surgery removes the cloudy lens and usually replaces it with a substitute lens (intraocular lens, IOL).  At a time when both you and your doctor agree, the cataract will be surgically removed. If you have cataracts in both eyes, only one is usually removed at a time. This allows the operated eye to heal and be out of danger from any possible problems after surgery (such as infection or poor wound healing). In rare cases, a cataract may be doing damage to your eye. In these cases, your caregiver may advise surgical removal right away. The vast majority of people who have cataract surgery have better vision afterward. HOME CARE INSTRUCTIONS  If you are not planning surgery, you may be asked to do the following:  Use different eyeglasses.   Use stronger or brighter lighting.   Ask your eye doctor about reducing your medicine  dose or changing medicines if it is thought that a medicine caused your cataract. Changing medicines does not make the cataract go away on its own.   Become familiar with your surroundings. Poor vision can lead to injury. Avoid bumping into things on the affected side. You are at a higher risk for tripping or falling.   Exercise extreme care when driving or operating machinery.   Wear sunglasses if you are sensitive to bright light or experiencing problems with glare.  SEEK IMMEDIATE MEDICAL CARE IF:   You have  a worsening or sudden vision loss.   You notice redness, swelling, or increasing pain in the eye.   You have a fever.  Document Released: 06/12/2005 Document Revised: 06/01/2011 Document Reviewed: 02/03/2011 [facility] Patient Information 2012 [place].PATIENT INSTRUCTIONS POST-ANESTHESIA  IMMEDIATELY FOLLOWING SURGERY:  Do not drive or operate machinery for the first twenty four hours after surgery.  Do not make any important decisions for twenty four hours after surgery or while taking narcotic pain medications or sedatives.  If you develop intractable nausea and vomiting or a severe headache please notify your doctor immediately.  FOLLOW-UP:  Please make an appointment with your surgeon as instructed. You do not need to follow up with anesthesia unless specifically instructed to do so.  WOUND CARE INSTRUCTIONS (if applicable):  Keep a dry clean dressing on the anesthesia/puncture wound site if there is drainage.  Once the wound has quit draining you may leave it open to air.  Generally you should leave the bandage intact for twenty four hours unless there is drainage.  If the epidural site drains for more than 36-48 hours please call the anesthesia department.  QUESTIONS?:  Please feel free to call your physician or the hospital operator if you have any questions, and they will be happy to assist you.

## 2014-08-17 NOTE — Progress Notes (Signed)
Patient given instructions with web site and code to sign up for "My Chart"  

## 2014-08-19 MED ORDER — LIDOCAINE HCL 3.5 % OP GEL
OPHTHALMIC | Status: AC
  Filled 2014-08-19: qty 1

## 2014-08-19 MED ORDER — LIDOCAINE HCL (PF) 1 % IJ SOLN
INTRAMUSCULAR | Status: AC
  Filled 2014-08-19: qty 2

## 2014-08-19 MED ORDER — NEOMYCIN-POLYMYXIN-DEXAMETH 3.5-10000-0.1 OP SUSP
OPHTHALMIC | Status: AC
  Filled 2014-08-19: qty 5

## 2014-08-19 MED ORDER — CYCLOPENTOLATE-PHENYLEPHRINE OP SOLN OPTIME - NO CHARGE
OPHTHALMIC | Status: AC
  Filled 2014-08-19: qty 2

## 2014-08-19 MED ORDER — PHENYLEPHRINE HCL 2.5 % OP SOLN
OPHTHALMIC | Status: AC
  Filled 2014-08-19: qty 15

## 2014-08-19 MED ORDER — TETRACAINE HCL 0.5 % OP SOLN
OPHTHALMIC | Status: AC
  Filled 2014-08-19: qty 2

## 2014-08-20 ENCOUNTER — Ambulatory Visit (HOSPITAL_COMMUNITY): Payer: Medicare Other | Admitting: Anesthesiology

## 2014-08-20 ENCOUNTER — Ambulatory Visit (HOSPITAL_COMMUNITY)
Admission: RE | Admit: 2014-08-20 | Discharge: 2014-08-20 | Disposition: A | Discharge status: Home / Self Care | Payer: Medicare Other | Source: Ambulatory Visit | Attending: Ophthalmology | Admitting: Ophthalmology

## 2014-08-20 ENCOUNTER — Encounter (HOSPITAL_COMMUNITY): Payer: Self-pay | Admitting: *Deleted

## 2014-08-20 ENCOUNTER — Encounter (HOSPITAL_COMMUNITY)
Admission: RE | Disposition: A | Discharge status: Home / Self Care | Payer: Self-pay | Source: Ambulatory Visit | Attending: Ophthalmology

## 2014-08-20 DIAGNOSIS — H269 Unspecified cataract: Secondary | ICD-10-CM | POA: Diagnosis not present

## 2014-08-20 DIAGNOSIS — H25811 Combined forms of age-related cataract, right eye: Secondary | ICD-10-CM | POA: Diagnosis not present

## 2014-08-20 HISTORY — PX: CATARACT EXTRACTION W/PHACO: SHX586

## 2014-08-20 LAB — GLUCOSE, CAPILLARY: Glucose-Capillary: 238 mg/dL — ABNORMAL HIGH (ref 70–99)

## 2014-08-20 SURGERY — PHACOEMULSIFICATION, CATARACT, WITH IOL INSERTION
Anesthesia: Monitor Anesthesia Care | Site: Eye | Laterality: Right

## 2014-08-20 MED ORDER — FENTANYL CITRATE 0.05 MG/ML IJ SOLN
25.0000 ug | INTRAMUSCULAR | Status: AC
  Administered 2014-08-20 (×2): 25 ug via INTRAVENOUS

## 2014-08-20 MED ORDER — LIDOCAINE HCL (PF) 1 % IJ SOLN
INTRAMUSCULAR | Status: AC
  Filled 2014-08-20: qty 2

## 2014-08-20 MED ORDER — LIDOCAINE HCL 3.5 % OP GEL
1.0000 "application " | Freq: Once | OPHTHALMIC | Status: AC
  Administered 2014-08-20: 1 via OPHTHALMIC

## 2014-08-20 MED ORDER — PHENYLEPHRINE HCL 2.5 % OP SOLN
1.0000 [drp] | OPHTHALMIC | Status: AC
  Administered 2014-08-20 (×3): 1 [drp] via OPHTHALMIC

## 2014-08-20 MED ORDER — PROVISC 10 MG/ML IO SOLN
INTRAOCULAR | Status: DC | PRN
  Administered 2014-08-20: 0.85 mL via INTRAOCULAR

## 2014-08-20 MED ORDER — TETRACAINE HCL 0.5 % OP SOLN
1.0000 [drp] | OPHTHALMIC | Status: AC
  Administered 2014-08-20 (×3): 1 [drp] via OPHTHALMIC

## 2014-08-20 MED ORDER — EPINEPHRINE HCL 1 MG/ML IJ SOLN
INTRAOCULAR | Status: DC | PRN
  Administered 2014-08-20: 500 mL

## 2014-08-20 MED ORDER — LACTATED RINGERS IV SOLN
INTRAVENOUS | Status: DC
  Administered 2014-08-20: 08:00:00 via INTRAVENOUS

## 2014-08-20 MED ORDER — NEOMYCIN-POLYMYXIN-DEXAMETH 3.5-10000-0.1 OP SUSP
OPHTHALMIC | Status: DC | PRN
  Administered 2014-08-20: 1 [drp] via OPHTHALMIC

## 2014-08-20 MED ORDER — CYCLOPENTOLATE-PHENYLEPHRINE 0.2-1 % OP SOLN
1.0000 [drp] | OPHTHALMIC | Status: AC
  Administered 2014-08-20 (×3): 1 [drp] via OPHTHALMIC

## 2014-08-20 MED ORDER — MIDAZOLAM HCL 2 MG/2ML IJ SOLN
INTRAMUSCULAR | Status: AC
  Filled 2014-08-20: qty 2

## 2014-08-20 MED ORDER — MIDAZOLAM HCL 2 MG/2ML IJ SOLN
1.0000 mg | INTRAMUSCULAR | Status: DC | PRN
Start: 2014-08-20 — End: 2014-08-20
  Administered 2014-08-20: 2 mg via INTRAVENOUS

## 2014-08-20 MED ORDER — POVIDONE-IODINE 5 % OP SOLN
OPHTHALMIC | Status: DC | PRN
  Administered 2014-08-20: 1 via OPHTHALMIC

## 2014-08-20 MED ORDER — FENTANYL CITRATE 0.05 MG/ML IJ SOLN
INTRAMUSCULAR | Status: AC
  Filled 2014-08-20: qty 2

## 2014-08-20 MED ORDER — EPINEPHRINE HCL 1 MG/ML IJ SOLN
INTRAMUSCULAR | Status: AC
  Filled 2014-08-20: qty 1

## 2014-08-20 MED ORDER — LIDOCAINE HCL (PF) 1 % IJ SOLN
INTRAMUSCULAR | Status: DC | PRN
  Administered 2014-08-20: .5 mL

## 2014-08-20 MED ORDER — BSS IO SOLN
INTRAOCULAR | Status: DC | PRN
  Administered 2014-08-20: 15 mL

## 2014-08-20 SURGICAL SUPPLY — 11 items
CLOTH BEACON ORANGE TIMEOUT ST (SAFETY) ×3 IMPLANT
EYE SHIELD UNIVERSAL CLEAR (GAUZE/BANDAGES/DRESSINGS) ×3 IMPLANT
GLOVE BIOGEL PI IND STRL 7.0 (GLOVE) ×2 IMPLANT
GLOVE BIOGEL PI INDICATOR 7.0 (GLOVE) ×4
GLOVE EXAM NITRILE MD LF STRL (GLOVE) ×3 IMPLANT
PAD ARMBOARD 7.5X6 YLW CONV (MISCELLANEOUS) ×3 IMPLANT
SIGHTPATH CAT PROC W REG LENS (Ophthalmic Related) ×3 IMPLANT
SYRINGE LUER LOK 1CC (MISCELLANEOUS) ×3 IMPLANT
TAPE SURG TRANSPORE 1 IN (GAUZE/BANDAGES/DRESSINGS) ×1 IMPLANT
TAPE SURGICAL TRANSPORE 1 IN (GAUZE/BANDAGES/DRESSINGS) ×2
WATER STERILE IRR 250ML POUR (IV SOLUTION) ×3 IMPLANT

## 2014-08-20 NOTE — Transfer of Care (Signed)
Immediate Anesthesia Transfer of Care Note  Patient: Victoria Lara  Procedure(s) Performed: Procedure(s) with comments: CATARACT EXTRACTION PHACO AND INTRAOCULAR LENS PLACEMENT (IOC) (Right) - CDE:5.31  Patient Location: Short Stay  Anesthesia Type:MAC  Level of Consciousness: awake, alert , oriented and patient cooperative  Airway & Oxygen Therapy: Patient Spontanous Breathing  Post-op Assessment: Report given to RN and Post -op Vital signs reviewed and stable  Post vital signs: Reviewed and stable  Last Vitals:  Filed Vitals:   08/20/14 0750  BP: 112/77  Pulse:   Temp:   Resp: 17    Complications: No apparent anesthesia complications

## 2014-08-20 NOTE — Anesthesia Postprocedure Evaluation (Signed)
  Anesthesia Post-op Note  Patient: Victoria Lara  Procedure(s) Performed: Procedure(s) with comments: CATARACT EXTRACTION PHACO AND INTRAOCULAR LENS PLACEMENT (IOC) (Right) - CDE:5.31  Patient Location: Short Stay  Anesthesia Type:MAC  Level of Consciousness: awake, alert , oriented and patient cooperative  Airway and Oxygen Therapy: Patient Spontanous Breathing  Post-op Pain: none  Post-op Assessment: Post-op Vital signs reviewed, Patient's Cardiovascular Status Stable, Respiratory Function Stable, Patent Airway and No signs of Nausea or vomiting  Post-op Vital Signs: Reviewed and stable  Last Vitals:  Filed Vitals:   08/20/14 0750  BP: 112/77  Pulse:   Temp:   Resp: 17    Complications: No apparent anesthesia complications

## 2014-08-20 NOTE — Discharge Instructions (Signed)

## 2014-08-20 NOTE — H&P (Signed)
I have reviewed the H&P, the patient was re-examined, and I have identified no interval changes in medical condition and plan of care since the history and physical of record  

## 2014-08-20 NOTE — Op Note (Signed)
Date of Admission: 08/20/2014  Date of Surgery: 08/20/2014   Pre-Op Dx: Cataract Right Eye  Post-Op Dx: Senile Combined Cataract Right  Eye,  Dx Code J47.829H25.811  Surgeon: Gemma PayorKerry Iliza Blankenbeckler, M.D.  Assistants: None  Anesthesia: Topical with MAC  Indications: Painless, progressive loss of vision with compromise of daily activities.  Surgery: Cataract Extraction with Intraocular lens Implant Right Eye  Discription: The patient had dilating drops and viscous lidocaine placed into the Right eye in the pre-op holding area. After transfer to the operating room, a time out was performed. The patient was then prepped and draped. Beginning with a 75 degree blade a paracentesis port was made at the surgeon's 2 o'clock position. The anterior chamber was then filled with 1% non-preserved lidocaine. This was followed by filling the anterior chamber with Provisc.  A 2.684mm keratome blade was used to make a clear corneal incision at the temporal limbus.  A bent cystatome needle was used to create a continuous tear capsulotomy. Hydrodissection was performed with balanced salt solution on a Fine canula. The lens nucleus was then removed using the phacoemulsification handpiece. Residual cortex was removed with the I&A handpiece. The anterior chamber and capsular bag were refilled with Provisc. A posterior chamber intraocular lens was placed into the capsular bag with it's injector. The implant was positioned with the Kuglan hook. The Provisc was then removed from the anterior chamber and capsular bag with the I&A handpiece. Stromal hydration of the main incision and paracentesis port was performed with BSS on a Fine canula. The wounds were tested for leak which was negative. The patient tolerated the procedure well. There were no operative complications. The patient was then transferred to the recovery room in stable condition.  Complications: None  Specimen: None  EBL: None  Prosthetic device: Hoya iSert 250, power 16.5  D, SN [id number].

## 2014-08-20 NOTE — Anesthesia Preprocedure Evaluation (Signed)
Anesthesia Evaluation  Patient identified by MRN, date of birth, ID band Patient awake    Reviewed: Allergy & Precautions, NPO status , Patient's Chart, lab work & pertinent test results  Airway Mallampati: II  TM Distance: >3 FB     Dental  (+) Partial Lower, Partial Upper   Pulmonary COPDCurrent Smoker,  breath sounds clear to auscultation        Cardiovascular hypertension, Pt. on medications Rhythm:Regular Rate:Normal     Neuro/Psych PSYCHIATRIC DISORDERS Depression    GI/Hepatic GERD-  Medicated and Controlled,  Endo/Other  diabetes, Type 2, Oral Hypoglycemic Agents  Renal/GU      Musculoskeletal   Abdominal   Peds  Hematology   Anesthesia Other Findings   Reproductive/Obstetrics                             Anesthesia Physical Anesthesia Plan  ASA: III  Anesthesia Plan: MAC   Post-op Pain Management:    Induction: Intravenous  Airway Management Planned: Nasal Cannula  Additional Equipment:   Intra-op Plan:   Post-operative Plan:   Informed Consent: I have reviewed the patients History and Physical, chart, labs and discussed the procedure including the risks, benefits and alternatives for the proposed anesthesia with the patient or authorized representative who has indicated his/her understanding and acceptance.     Plan Discussed with:   Anesthesia Plan Comments:         Anesthesia Quick Evaluation

## 2014-08-21 ENCOUNTER — Encounter (HOSPITAL_COMMUNITY): Payer: Self-pay | Admitting: Ophthalmology

## 2014-09-08 DIAGNOSIS — J449 Chronic obstructive pulmonary disease, unspecified: Secondary | ICD-10-CM | POA: Diagnosis not present

## 2014-10-09 DIAGNOSIS — J449 Chronic obstructive pulmonary disease, unspecified: Secondary | ICD-10-CM | POA: Diagnosis not present

## 2014-10-27 DIAGNOSIS — I10 Essential (primary) hypertension: Secondary | ICD-10-CM | POA: Diagnosis not present

## 2014-10-27 DIAGNOSIS — E1165 Type 2 diabetes mellitus with hyperglycemia: Secondary | ICD-10-CM | POA: Diagnosis not present

## 2014-10-27 DIAGNOSIS — E780 Pure hypercholesterolemia: Secondary | ICD-10-CM | POA: Diagnosis not present

## 2014-11-03 DIAGNOSIS — E1165 Type 2 diabetes mellitus with hyperglycemia: Secondary | ICD-10-CM | POA: Diagnosis not present

## 2014-11-03 DIAGNOSIS — Z720 Tobacco use: Secondary | ICD-10-CM | POA: Diagnosis not present

## 2014-11-03 DIAGNOSIS — I10 Essential (primary) hypertension: Secondary | ICD-10-CM | POA: Diagnosis not present

## 2014-11-03 DIAGNOSIS — F411 Generalized anxiety disorder: Secondary | ICD-10-CM | POA: Diagnosis not present

## 2014-11-04 DIAGNOSIS — E1165 Type 2 diabetes mellitus with hyperglycemia: Secondary | ICD-10-CM | POA: Diagnosis not present

## 2014-11-08 DIAGNOSIS — J449 Chronic obstructive pulmonary disease, unspecified: Secondary | ICD-10-CM | POA: Diagnosis not present

## 2014-12-05 DIAGNOSIS — E1165 Type 2 diabetes mellitus with hyperglycemia: Secondary | ICD-10-CM | POA: Diagnosis not present

## 2014-12-09 DIAGNOSIS — J449 Chronic obstructive pulmonary disease, unspecified: Secondary | ICD-10-CM | POA: Diagnosis not present

## 2015-01-04 DIAGNOSIS — M1990 Unspecified osteoarthritis, unspecified site: Secondary | ICD-10-CM | POA: Diagnosis not present

## 2015-01-08 DIAGNOSIS — Z1231 Encounter for screening mammogram for malignant neoplasm of breast: Secondary | ICD-10-CM | POA: Diagnosis not present

## 2015-01-08 DIAGNOSIS — J449 Chronic obstructive pulmonary disease, unspecified: Secondary | ICD-10-CM | POA: Diagnosis not present

## 2015-02-04 DIAGNOSIS — M1990 Unspecified osteoarthritis, unspecified site: Secondary | ICD-10-CM | POA: Diagnosis not present

## 2015-02-08 DIAGNOSIS — J449 Chronic obstructive pulmonary disease, unspecified: Secondary | ICD-10-CM | POA: Diagnosis not present

## 2015-02-28 ENCOUNTER — Emergency Department (HOSPITAL_COMMUNITY): Payer: Medicare Other

## 2015-02-28 ENCOUNTER — Emergency Department (HOSPITAL_COMMUNITY)
Admission: EM | Admit: 2015-02-28 | Discharge: 2015-02-28 | Disposition: A | Discharge status: Home / Self Care | Payer: Medicare Other | Attending: Emergency Medicine | Admitting: Emergency Medicine

## 2015-02-28 ENCOUNTER — Encounter (HOSPITAL_COMMUNITY): Payer: Self-pay | Admitting: Emergency Medicine

## 2015-02-28 DIAGNOSIS — E119 Type 2 diabetes mellitus without complications: Secondary | ICD-10-CM | POA: Diagnosis not present

## 2015-02-28 DIAGNOSIS — I10 Essential (primary) hypertension: Secondary | ICD-10-CM | POA: Diagnosis not present

## 2015-02-28 DIAGNOSIS — F329 Major depressive disorder, single episode, unspecified: Secondary | ICD-10-CM | POA: Insufficient documentation

## 2015-02-28 DIAGNOSIS — S4991XA Unspecified injury of right shoulder and upper arm, initial encounter: Secondary | ICD-10-CM | POA: Diagnosis not present

## 2015-02-28 DIAGNOSIS — W228XXA Striking against or struck by other objects, initial encounter: Secondary | ICD-10-CM | POA: Insufficient documentation

## 2015-02-28 DIAGNOSIS — M25511 Pain in right shoulder: Secondary | ICD-10-CM | POA: Diagnosis not present

## 2015-02-28 DIAGNOSIS — Z720 Tobacco use: Secondary | ICD-10-CM | POA: Insufficient documentation

## 2015-02-28 DIAGNOSIS — Z79899 Other long term (current) drug therapy: Secondary | ICD-10-CM | POA: Diagnosis not present

## 2015-02-28 DIAGNOSIS — G8929 Other chronic pain: Secondary | ICD-10-CM | POA: Insufficient documentation

## 2015-02-28 DIAGNOSIS — Y9389 Activity, other specified: Secondary | ICD-10-CM | POA: Insufficient documentation

## 2015-02-28 DIAGNOSIS — W19XXXA Unspecified fall, initial encounter: Secondary | ICD-10-CM

## 2015-02-28 DIAGNOSIS — Z7982 Long term (current) use of aspirin: Secondary | ICD-10-CM | POA: Insufficient documentation

## 2015-02-28 DIAGNOSIS — Y998 Other external cause status: Secondary | ICD-10-CM | POA: Diagnosis not present

## 2015-02-28 DIAGNOSIS — M79604 Pain in right leg: Secondary | ICD-10-CM | POA: Diagnosis not present

## 2015-02-28 DIAGNOSIS — Y92481 Parking lot as the place of occurrence of the external cause: Secondary | ICD-10-CM | POA: Diagnosis not present

## 2015-02-28 DIAGNOSIS — T148 Other injury of unspecified body region: Secondary | ICD-10-CM | POA: Diagnosis not present

## 2015-02-28 DIAGNOSIS — S99911A Unspecified injury of right ankle, initial encounter: Secondary | ICD-10-CM | POA: Diagnosis not present

## 2015-02-28 DIAGNOSIS — M25571 Pain in right ankle and joints of right foot: Secondary | ICD-10-CM | POA: Diagnosis not present

## 2015-02-28 DIAGNOSIS — S80211A Abrasion, right knee, initial encounter: Secondary | ICD-10-CM | POA: Diagnosis not present

## 2015-02-28 MED ORDER — HYDROCODONE-ACETAMINOPHEN 5-325 MG PO TABS
1.0000 | ORAL_TABLET | Freq: Once | ORAL | Status: AC
  Administered 2015-02-28: 1 via ORAL
  Filled 2015-02-28: qty 1

## 2015-02-28 NOTE — ED Notes (Signed)
Pt log rolled off back board with MD assistance and instruction- MD continuing to assess pt at this time
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
## 2015-02-28 NOTE — Discharge Instructions (Signed)
Return without fail for worsening symptoms, including confusion, difficulty with walking, worsening pain, numbness or weakness, or any other symptoms concerning to you. Please ice your shoulder at rest.   Shoulder Pain The shoulder is the joint that connects your arm to your body. Muscles and band-like tissues that connect bones to muscles (tendons) hold the joint together. Shoulder pain is felt if an injury or medical problem affects one or more parts of the shoulder. HOME CARE   Put ice on the sore area.  Put ice in a plastic bag.  Place a towel between your skin and the bag.  Leave the ice on for 15-20 minutes, 03-04 times a day for the first 2 days.  Stop using cold packs if they do not help with the pain.  If you were given something to keep your shoulder from moving (sling; shoulder immobilizer), wear it as told. Only take it off to shower or bathe.  Move your arm as little as possible, but keep your hand moving to prevent puffiness (swelling).  Squeeze a soft ball or foam pad as much as possible to help prevent swelling.  Take medicine as told by your doctor. GET HELP IF:  You have progressing new pain in your arm, hand, or fingers.  Your hand or fingers get cold.  Your medicine does not help lessen your pain. GET HELP RIGHT AWAY IF:   Your arm, hand, or fingers are numb or tingling.  Your arm, hand, or fingers are puffy (swollen), painful, or turn white or blue. MAKE SURE YOU:   Understand these instructions.  Will watch your condition.  Will get help right away if you are not doing well or get worse. Document Released: 11/29/2007 Document Revised: 10/27/2013 Document Reviewed: 12/25/2011 [facility] Patient Information 2015 [place], [place]. This information is not intended to replace advice given to you by your health care provider. Make sure you discuss any questions you have with your health care provider.

## 2015-02-28 NOTE — ED Notes (Signed)
Pt given discharge instructions- discussed OTC pain med use and also taking hot showers . Verbalized understanding . ( This nurse confirmed with md prior to discharge that no pain med scripts to be received )  _ Pt ambulated off unit with family
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
## 2015-03-07 DIAGNOSIS — M1990 Unspecified osteoarthritis, unspecified site: Secondary | ICD-10-CM | POA: Diagnosis not present

## 2015-03-09 DIAGNOSIS — M75111 Incomplete rotator cuff tear or rupture of right shoulder, not specified as traumatic: Secondary | ICD-10-CM | POA: Diagnosis not present

## 2015-03-11 DIAGNOSIS — J449 Chronic obstructive pulmonary disease, unspecified: Secondary | ICD-10-CM | POA: Diagnosis not present

## 2015-03-16 DIAGNOSIS — Z720 Tobacco use: Secondary | ICD-10-CM | POA: Diagnosis not present

## 2015-03-16 DIAGNOSIS — E780 Pure hypercholesterolemia: Secondary | ICD-10-CM | POA: Diagnosis not present

## 2015-03-16 DIAGNOSIS — E1165 Type 2 diabetes mellitus with hyperglycemia: Secondary | ICD-10-CM | POA: Diagnosis not present

## 2015-03-16 DIAGNOSIS — I10 Essential (primary) hypertension: Secondary | ICD-10-CM | POA: Diagnosis not present

## 2015-03-16 DIAGNOSIS — J209 Acute bronchitis, unspecified: Secondary | ICD-10-CM | POA: Diagnosis not present

## 2015-03-16 DIAGNOSIS — J069 Acute upper respiratory infection, unspecified: Secondary | ICD-10-CM | POA: Diagnosis not present

## 2015-03-22 DIAGNOSIS — I10 Essential (primary) hypertension: Secondary | ICD-10-CM | POA: Diagnosis not present

## 2015-03-22 DIAGNOSIS — E1165 Type 2 diabetes mellitus with hyperglycemia: Secondary | ICD-10-CM | POA: Diagnosis not present

## 2015-03-22 DIAGNOSIS — Z720 Tobacco use: Secondary | ICD-10-CM | POA: Diagnosis not present

## 2015-03-22 DIAGNOSIS — F411 Generalized anxiety disorder: Secondary | ICD-10-CM | POA: Diagnosis not present

## 2015-03-23 DIAGNOSIS — Z23 Encounter for immunization: Secondary | ICD-10-CM | POA: Diagnosis not present

## 2015-03-29 DIAGNOSIS — M75121 Complete rotator cuff tear or rupture of right shoulder, not specified as traumatic: Secondary | ICD-10-CM | POA: Diagnosis not present

## 2015-03-30 ENCOUNTER — Other Ambulatory Visit (HOSPITAL_COMMUNITY): Payer: Self-pay | Admitting: Orthopedic Surgery

## 2015-03-30 DIAGNOSIS — R52 Pain, unspecified: Secondary | ICD-10-CM

## 2015-04-06 DIAGNOSIS — R609 Edema, unspecified: Secondary | ICD-10-CM | POA: Diagnosis not present

## 2015-04-07 ENCOUNTER — Other Ambulatory Visit (HOSPITAL_COMMUNITY): Payer: Self-pay

## 2015-04-10 DIAGNOSIS — J449 Chronic obstructive pulmonary disease, unspecified: Secondary | ICD-10-CM | POA: Diagnosis not present

## 2015-04-16 ENCOUNTER — Ambulatory Visit (HOSPITAL_COMMUNITY)
Admission: RE | Admit: 2015-04-16 | Discharge: 2015-04-16 | Disposition: A | Discharge status: Home / Self Care | Payer: Medicare Other | Source: Ambulatory Visit | Attending: Orthopedic Surgery | Admitting: Orthopedic Surgery

## 2015-04-16 ENCOUNTER — Other Ambulatory Visit (HOSPITAL_COMMUNITY): Payer: Self-pay | Admitting: Orthopedic Surgery

## 2015-04-16 DIAGNOSIS — R937 Abnormal findings on diagnostic imaging of other parts of musculoskeletal system: Secondary | ICD-10-CM | POA: Insufficient documentation

## 2015-04-16 DIAGNOSIS — M25511 Pain in right shoulder: Secondary | ICD-10-CM | POA: Diagnosis not present

## 2015-04-16 DIAGNOSIS — W19XXXA Unspecified fall, initial encounter: Secondary | ICD-10-CM | POA: Insufficient documentation

## 2015-04-16 DIAGNOSIS — Z9889 Other specified postprocedural states: Secondary | ICD-10-CM | POA: Diagnosis not present

## 2015-04-16 DIAGNOSIS — S46011A Strain of muscle(s) and tendon(s) of the rotator cuff of right shoulder, initial encounter: Secondary | ICD-10-CM | POA: Insufficient documentation

## 2015-04-16 DIAGNOSIS — R52 Pain, unspecified: Secondary | ICD-10-CM

## 2015-04-16 MED ORDER — LIDOCAINE HCL (PF) 1 % IJ SOLN
INTRAMUSCULAR | Status: AC
  Filled 2015-04-16: qty 5

## 2015-04-16 MED ORDER — IOHEXOL 300 MG/ML  SOLN
50.0000 mL | Freq: Once | INTRAMUSCULAR | Status: DC | PRN
  Administered 2015-04-16: 15 mL via INTRA_ARTICULAR
  Filled 2015-04-16: qty 50

## 2015-04-16 MED ORDER — GADOBENATE DIMEGLUMINE 529 MG/ML IV SOLN
5.0000 mL | Freq: Once | INTRAVENOUS | Status: DC | PRN
  Administered 2015-04-16: 0.05 mL via INTRA_ARTICULAR
  Filled 2015-04-16: qty 5

## 2015-05-03 DIAGNOSIS — M75121 Complete rotator cuff tear or rupture of right shoulder, not specified as traumatic: Secondary | ICD-10-CM | POA: Diagnosis not present

## 2015-05-07 DIAGNOSIS — I10 Essential (primary) hypertension: Secondary | ICD-10-CM | POA: Diagnosis not present

## 2015-05-07 DIAGNOSIS — E119 Type 2 diabetes mellitus without complications: Secondary | ICD-10-CM | POA: Diagnosis not present

## 2015-05-11 DIAGNOSIS — J449 Chronic obstructive pulmonary disease, unspecified: Secondary | ICD-10-CM | POA: Diagnosis not present

## 2015-06-06 DIAGNOSIS — E119 Type 2 diabetes mellitus without complications: Secondary | ICD-10-CM | POA: Diagnosis not present

## 2015-06-06 DIAGNOSIS — I10 Essential (primary) hypertension: Secondary | ICD-10-CM | POA: Diagnosis not present

## 2015-06-10 DIAGNOSIS — J449 Chronic obstructive pulmonary disease, unspecified: Secondary | ICD-10-CM | POA: Diagnosis not present

## 2015-07-07 DIAGNOSIS — J45909 Unspecified asthma, uncomplicated: Secondary | ICD-10-CM | POA: Diagnosis not present

## 2015-07-07 DIAGNOSIS — I10 Essential (primary) hypertension: Secondary | ICD-10-CM | POA: Diagnosis not present

## 2015-07-09 DIAGNOSIS — E7800 Pure hypercholesterolemia, unspecified: Secondary | ICD-10-CM | POA: Diagnosis not present

## 2015-07-09 DIAGNOSIS — I10 Essential (primary) hypertension: Secondary | ICD-10-CM | POA: Diagnosis not present

## 2015-07-09 DIAGNOSIS — E1165 Type 2 diabetes mellitus with hyperglycemia: Secondary | ICD-10-CM | POA: Diagnosis not present

## 2015-07-11 DIAGNOSIS — J449 Chronic obstructive pulmonary disease, unspecified: Secondary | ICD-10-CM | POA: Diagnosis not present

## 2015-07-16 DIAGNOSIS — I10 Essential (primary) hypertension: Secondary | ICD-10-CM | POA: Diagnosis not present

## 2015-07-16 DIAGNOSIS — E1165 Type 2 diabetes mellitus with hyperglycemia: Secondary | ICD-10-CM | POA: Diagnosis not present

## 2015-08-11 DIAGNOSIS — J449 Chronic obstructive pulmonary disease, unspecified: Secondary | ICD-10-CM | POA: Diagnosis not present

## 2015-08-30 DIAGNOSIS — M75121 Complete rotator cuff tear or rupture of right shoulder, not specified as traumatic: Secondary | ICD-10-CM | POA: Diagnosis not present

## 2015-09-02 DIAGNOSIS — J0101 Acute recurrent maxillary sinusitis: Secondary | ICD-10-CM | POA: Diagnosis not present

## 2015-09-08 DIAGNOSIS — J449 Chronic obstructive pulmonary disease, unspecified: Secondary | ICD-10-CM | POA: Diagnosis not present

## 2015-09-14 DIAGNOSIS — J441 Chronic obstructive pulmonary disease with (acute) exacerbation: Secondary | ICD-10-CM | POA: Diagnosis not present

## 2015-09-14 DIAGNOSIS — Z720 Tobacco use: Secondary | ICD-10-CM | POA: Diagnosis not present

## 2015-09-14 DIAGNOSIS — J0101 Acute recurrent maxillary sinusitis: Secondary | ICD-10-CM | POA: Diagnosis not present

## 2015-10-09 DIAGNOSIS — J449 Chronic obstructive pulmonary disease, unspecified: Secondary | ICD-10-CM | POA: Diagnosis not present

## 2015-11-08 DIAGNOSIS — J449 Chronic obstructive pulmonary disease, unspecified: Secondary | ICD-10-CM | POA: Diagnosis not present

## 2015-11-10 DIAGNOSIS — J301 Allergic rhinitis due to pollen: Secondary | ICD-10-CM | POA: Diagnosis not present

## 2015-11-10 DIAGNOSIS — R55 Syncope and collapse: Secondary | ICD-10-CM | POA: Diagnosis not present

## 2015-11-12 DIAGNOSIS — S59911A Unspecified injury of right forearm, initial encounter: Secondary | ICD-10-CM | POA: Diagnosis not present

## 2015-11-12 DIAGNOSIS — S6991XA Unspecified injury of right wrist, hand and finger(s), initial encounter: Secondary | ICD-10-CM | POA: Diagnosis not present

## 2015-11-16 DIAGNOSIS — I10 Essential (primary) hypertension: Secondary | ICD-10-CM | POA: Diagnosis not present

## 2015-11-16 DIAGNOSIS — E1165 Type 2 diabetes mellitus with hyperglycemia: Secondary | ICD-10-CM | POA: Diagnosis not present

## 2015-11-16 DIAGNOSIS — E7800 Pure hypercholesterolemia, unspecified: Secondary | ICD-10-CM | POA: Diagnosis not present

## 2015-11-16 LAB — HEMOGLOBIN A1C: Hemoglobin A1C: 10.2

## 2015-11-16 LAB — LIPID PANEL
Cholesterol: 174 mg/dL (ref 0–200)
HDL: 32 mg/dL — AB (ref 35–70)
LDL CALC: 99 mg/dL
Triglycerides: 213 mg/dL — AB (ref 40–160)

## 2015-11-24 ENCOUNTER — Encounter: Payer: Self-pay | Admitting: Neurology

## 2015-11-24 ENCOUNTER — Ambulatory Visit (INDEPENDENT_AMBULATORY_CARE_PROVIDER_SITE_OTHER): Payer: Medicare Other | Admitting: Neurology

## 2015-11-24 ENCOUNTER — Encounter: Payer: Self-pay | Admitting: *Deleted

## 2015-11-24 VITALS — BP 133/75 | HR 89 | Ht 61.0 in | Wt 190.2 lb

## 2015-11-24 DIAGNOSIS — R0683 Snoring: Secondary | ICD-10-CM | POA: Diagnosis not present

## 2015-11-24 DIAGNOSIS — G2581 Restless legs syndrome: Secondary | ICD-10-CM | POA: Diagnosis not present

## 2015-11-24 DIAGNOSIS — W19XXXA Unspecified fall, initial encounter: Secondary | ICD-10-CM | POA: Diagnosis not present

## 2015-11-24 DIAGNOSIS — R400 Somnolence: Secondary | ICD-10-CM

## 2015-11-24 DIAGNOSIS — R4182 Altered mental status, unspecified: Secondary | ICD-10-CM | POA: Diagnosis not present

## 2015-11-24 DIAGNOSIS — R419 Unspecified symptoms and signs involving cognitive functions and awareness: Secondary | ICD-10-CM | POA: Diagnosis not present

## 2015-11-24 DIAGNOSIS — G4710 Hypersomnia, unspecified: Secondary | ICD-10-CM | POA: Diagnosis not present

## 2015-11-24 NOTE — Patient Instructions (Addendum)
Remember to drink plenty of fluid, eat healthy meals and do not skip any meals. Try to eat protein with a every meal and eat a healthy snack such as fruit or nuts in between meals. Try to keep a regular sleep-wake schedule and try to exercise daily, particularly in the form of walking, 20-30 minutes a day, if you can.   As far as your medications are concerned, I would like to suggest:None at this time  As far as diagnostic testing: sleep evaluation with Dr. Vickey Hugerohmeier, Labs, MRI of the brain, EEG in the office then 3-day eeg  Our phone number is [phone]. We also have an after hours call service for urgent matters and there is a physician on-call for urgent questions. For any emergencies you know to call 911 or go to the nearest emergency room

## 2015-11-24 NOTE — Progress Notes (Signed)
[facility]    Provider:  Dr Lucia Gaskins Referring Provider: Estanislado Pandy, MD Primary Care Physician:  Estanislado Pandy, MD  CC:  Syncope  HPI:  Victoria Lara is a 60 y.o. female here as a referral for syncope. Past medical history of hypertension, COPD, diabetes, neuropathy, depression. She is a current, every day smoker. Rerral from Dr. Neita Carp for syncope syncope for 12 years. She passes out about 5-7 seconds and then wakes up, falls to the ground. Daughter here and provides information. Happens every 3-4 months. She will be walking and she knows when it happens, she raises her hand and says she is "going down", she feels lightheaded, blood pressure and glucose ok during episodes as daughter checks. She is very stressed and has a lot of anxiety. She used to be on Xanax. She still had symptoms of syncope on the Xanax. She can hear everyone during the episodes, her eyes blink, in 3-5 seconds she recognized her family and no significant confusion. She does say she has altered awareness during the event however. She denies SOB, she denies CP during the events. She endorses feeling weak during the episodes. She is "mumbly" when talking. It could be stress. Patient does not sleep. She snores "ridiculously loud", she falls asleep at the drop of a hat, sleeps all day. She also has RLS. They would like a sleep evaluation. No other focal neurologic symptoms. No personal or family history of seizures.  Reviewed notes, labs and imaging from outside physicians, which showed:   Reviewed EKG tracing, QTC 453.  Review of Systems: Patient complains of symptoms per HPI as well as the following symptoms: weight gain, trouble swallowing, depression, anxiety, not enough sleep, change in appetite. Pertinent negatives per HPI. All others negative.   Social History   Social History  . Marital Status: Widowed    Spouse Name: N/A  . Number of Children: 1  . Years of Education: 12    Occupational History  . Unemployed    Social History Main Topics  . Smoking status: Current Every Day Smoker -- 1.00 packs/day    Types: Cigarettes  . Smokeless tobacco: Not on file  . Alcohol Use: No  . Drug Use: No  . Sexual Activity: Not on file   Other Topics Concern  . Not on file   Social History Narrative   Lives with boyfriend   Caffeine use: sometimes per pt    Family History  Problem Relation Age of Onset  . Diabetes    . Angina    . Seizures Neg Hx     Past Medical History  Diagnosis Date  . Diabetes mellitus without complication (HCC)   . Hypertension   . Depression   . Seasonal allergies   . Complication of anesthesia     had to come back to ER after shoulder surgery at [facility]     Past Surgical History  Procedure Laterality Date  . Hand surgery    . Shoulder arthroscopy with rotator cuff repair    . Cataract extraction w/phaco Right 08/20/2014    Procedure: CATARACT EXTRACTION PHACO AND INTRAOCULAR LENS PLACEMENT (IOC);  Surgeon: Gemma Payor, MD;  Location: [facility];  Service: Ophthalmology;  Laterality: Right;  CDE:5.31    Current Outpatient Prescriptions  Medication Sig Dispense Refill  . acetaminophen (TYLENOL) 500 MG tablet Take 1,000 mg by mouth every 6 (six) hours as needed for mild pain.    Marland Kitchen albuterol (PROVENTIL) (2.5 MG/3ML) 0.083% nebulizer solution  Take 3 mLs (2.5 mg total) by nebulization every 6 (six) hours as needed for wheezing. 75 mL 12  . amitriptyline (ELAVIL) 25 MG tablet Take 25 mg by mouth at bedtime.    Marland Kitchen aspirin EC 325 MG tablet Take 325 mg by mouth daily.    Marland Kitchen buPROPion (WELLBUTRIN SR) 150 MG 12 hr tablet Take 150 mg by mouth 2 (two) times daily.  1  . cetirizine (ZYRTEC) 10 MG tablet Take 10 mg by mouth daily.    . clonazePAM (KLONOPIN) 0.5 MG tablet Take 0.25-0.5 mg by mouth every 8 (eight) hours as needed for anxiety. for anxiety  3  . fluticasone (FLONASE) 50 MCG/ACT nasal spray Place 1 spray into both nostrils daily as  needed for allergies.     Marland Kitchen gabapentin (NEURONTIN) 300 MG capsule Take 300 mg by mouth 2 (two) times daily.    . hydrochlorothiazide (MICROZIDE) 12.5 MG capsule Take 12.5 mg by mouth daily.    Marland Kitchen levofloxacin (LEVAQUIN) 500 MG tablet Take 1 tablet (500 mg total) by mouth daily. 5 tablet 0  . lisinopril (PRINIVIL,ZESTRIL) 20 MG tablet Take 20 mg by mouth daily.    . meloxicam (MOBIC) 15 MG tablet Take 15 mg by mouth daily.    . metFORMIN (GLUCOPHAGE) 850 MG tablet Take 850 mg by mouth 3 (three) times daily.    Marland Kitchen omeprazole (PRILOSEC) 20 MG capsule Take 20 mg by mouth daily.    . predniSONE (DELTASONE) 10 MG tablet Take  po daily for 2 days then  po daily for 2 days then  po daily for 2 days then  po daily for 2 days then stop 20 tablet 0  . rOPINIRole (REQUIP) 0.25 MG tablet Take 0.25 mg by mouth every evening.     . ferrous sulfate 325 (65 FE) MG tablet Take 325 mg by mouth 2 (two) times daily with a meal.     No current facility-administered medications for this visit.    Allergies as of 11/24/2015  . (No Known Allergies)    Vitals: BP 133/75 mmHg  Pulse 89  Ht  (1.549 m)  Wt 190 lb 3.2 oz (86.274 kg)  BMI 35.96 kg/m2 Last Weight:  Wt Readings from Last 1 Encounters:  11/24/15 190 lb 3.2 oz (86.274 kg)   Last Height:   Ht Readings from Last 1 Encounters:  11/24/15  (1.549 m)    Physical exam: Exam: Gen: NAD, conversant, well nourised, obese, well groomed                     CV: RRR, no MRG. No Carotid Bruits. No peripheral edema, warm, nontender Eyes: Conjunctivae clear without exudates or hemorrhage  Neuro: Detailed Neurologic Exam  Speech:    Speech is normal; fluent and spontaneous with normal comprehension.  Cognition:    The patient is oriented to person, place, and time;     recent and remote memory intact;     language fluent;     normal attention, concentration,     fund of knowledge Cranial Nerves:    The pupils are equal, round,  and reactive to light. The fundi are normal and spontaneous venous pulsations are present. Visual fields are full to finger confrontation. Extraocular movements are intact. Trigeminal sensation is intact and the muscles of mastication are normal. The face is symmetric. The palate elevates in the midline. Hearing intact. Voice is normal. Shoulder shrug is normal. The tongue has normal motion without fasciculations.  Coordination:    Normal finger to nose and heel to shin. Normal rapid alternating movements.   Gait:    Wide based large body habitus  Motor Observation:    No asymmetry, no atrophy, and no involuntary movements noted. Tone:    Normal muscle tone.    Posture:    Posture is normal. normal erect    Strength: Right arm in pain, left 5/5, bilateral hip flexion weakness otherwise intact.     Strength is V/V in the upper and lower limbs.      Sensation: intact to LT     Reflex Exam:  DTR's: Absent AJs otherwise brisk. Deep tendon reflexes in the upper and lower extremities are normal bilaterally.   Toes:    The toes are downgoing bilaterally.   Clonus:    Clonus is absent.      Assessment/Plan:  60 year old female with 12 years of episodes of loss of consciousness versus altered awareness for 5-7 seconds at a time where she will fall to the floor, often knows the events are coming, can sometimes hear what is going on during the events but does endorse confusion during and after the events.  Snoring, excessive daytime fatigue, morbidly obese: Sleep eval with Dr. Vickey Hugerohmeier. ESS 17.  MRI of the brain EEG routine and then a 3-day eeg is unrevealing for episodes of confusion, loss of consciousness Patient is unable to drive, operate heavy machinery, perform activities at heights or participate in water activities until 6 months event free We'll check ferritin for restless leg syndrome. We'll also check a CMP.  Cc: Dr. Leeanne DeedSasser    Gaye Scorza, MD  [facility] [address] [place], [place] [id number]  Phone [phone] Fax [phone]

## 2015-11-25 ENCOUNTER — Telehealth: Payer: Self-pay | Admitting: *Deleted

## 2015-11-25 LAB — COMPREHENSIVE METABOLIC PANEL
ALT: 24 IU/L (ref 0–32)
AST: 25 IU/L (ref 0–40)
Albumin/Globulin Ratio: 1.8 (ref 1.2–2.2)
Albumin: 4.2 g/dL (ref 3.6–4.8)
Alkaline Phosphatase: 79 IU/L (ref 39–117)
BILIRUBIN TOTAL: 0.4 mg/dL (ref 0.0–1.2)
BUN/Creatinine Ratio: 30 — ABNORMAL HIGH (ref 12–28)
BUN: 24 mg/dL (ref 8–27)
CALCIUM: 9.4 mg/dL (ref 8.7–10.3)
CHLORIDE: 97 mmol/L (ref 96–106)
CO2: 25 mmol/L (ref 18–29)
Creatinine, Ser: 0.79 mg/dL (ref 0.57–1.00)
GFR, EST AFRICAN AMERICAN: 94 mL/min/{1.73_m2} (ref >59)
GFR, EST NON AFRICAN AMERICAN: 82 mL/min/{1.73_m2} (ref >59)
Globulin, Total: 2.4 g/dL (ref 1.5–4.5)
Glucose: 180 mg/dL — ABNORMAL HIGH (ref 65–99)
Potassium: 5 mmol/L (ref 3.5–5.2)
Sodium: 140 mmol/L (ref 134–144)
TOTAL PROTEIN: 6.6 g/dL (ref 6.0–8.5)

## 2015-11-25 LAB — CBC
HEMATOCRIT: 44 % (ref 34.0–46.6)
HEMOGLOBIN: 14.9 g/dL (ref 11.1–15.9)
MCH: 32.7 pg (ref 26.6–33.0)
MCHC: 33.9 g/dL (ref 31.5–35.7)
MCV: 97 fL (ref 79–97)
Platelets: 281 10*3/uL (ref 150–379)
RBC: 4.55 x10E6/uL (ref 3.77–5.28)
RDW: 13.8 % (ref 12.3–15.4)
WBC: 9.3 10*3/uL (ref 3.4–10.8)

## 2015-11-25 LAB — FERRITIN: FERRITIN: 61 ng/mL (ref 15–150)

## 2015-11-25 NOTE — Telephone Encounter (Signed)
-----   Message from Anson FretAntonia B Ahern, MD sent at 11/25/2015  8:03 AM EDT ----- Her ferritin is on the low side, this can contribute to restless leg syndrome. Ferritin can be low even if patient is not anemic, it is our iron stores. i recommend iron 325 mg twice daily, you can take it with some vitamin c or you can actually buy it OTC with vitamin c because that helps with absorption. Her glucose was elevated at 180 and she was a little dehydrated (BUN/Creatinine 30) but otherwise labs look fine thanks.

## 2015-11-25 NOTE — Telephone Encounter (Signed)
Called and spoke to daughter in law about lab results per Dr Lucia GaskinsAhern note. She wrote down instructions and read back how much iron Dr Lucia GaskinsAhern wanted her to take to ensure she had correct information. I added med to pt med list. She verbalized understanding.

## 2015-11-27 ENCOUNTER — Encounter: Payer: Self-pay | Admitting: Neurology

## 2015-11-28 DIAGNOSIS — R404 Transient alteration of awareness: Secondary | ICD-10-CM | POA: Diagnosis not present

## 2015-11-28 DIAGNOSIS — R42 Dizziness and giddiness: Secondary | ICD-10-CM | POA: Diagnosis not present

## 2015-11-29 ENCOUNTER — Other Ambulatory Visit (INDEPENDENT_AMBULATORY_CARE_PROVIDER_SITE_OTHER): Payer: Self-pay | Admitting: Otolaryngology

## 2015-11-29 ENCOUNTER — Ambulatory Visit (INDEPENDENT_AMBULATORY_CARE_PROVIDER_SITE_OTHER): Payer: Medicare Other | Admitting: Otolaryngology

## 2015-11-29 DIAGNOSIS — J342 Deviated nasal septum: Secondary | ICD-10-CM

## 2015-11-29 DIAGNOSIS — J329 Chronic sinusitis, unspecified: Secondary | ICD-10-CM

## 2015-11-29 DIAGNOSIS — J310 Chronic rhinitis: Secondary | ICD-10-CM | POA: Diagnosis not present

## 2015-11-29 DIAGNOSIS — J343 Hypertrophy of nasal turbinates: Secondary | ICD-10-CM

## 2015-12-06 ENCOUNTER — Ambulatory Visit (HOSPITAL_COMMUNITY)
Admission: RE | Admit: 2015-12-06 | Discharge: 2015-12-06 | Disposition: A | Discharge status: Home / Self Care | Payer: Medicare Other | Source: Ambulatory Visit | Attending: Otolaryngology | Admitting: Otolaryngology

## 2015-12-06 DIAGNOSIS — J329 Chronic sinusitis, unspecified: Secondary | ICD-10-CM | POA: Diagnosis not present

## 2015-12-06 DIAGNOSIS — E1165 Type 2 diabetes mellitus with hyperglycemia: Secondary | ICD-10-CM | POA: Diagnosis not present

## 2015-12-06 DIAGNOSIS — Z720 Tobacco use: Secondary | ICD-10-CM | POA: Diagnosis not present

## 2015-12-06 DIAGNOSIS — R600 Localized edema: Secondary | ICD-10-CM | POA: Insufficient documentation

## 2015-12-06 DIAGNOSIS — I10 Essential (primary) hypertension: Secondary | ICD-10-CM | POA: Diagnosis not present

## 2015-12-07 ENCOUNTER — Ambulatory Visit
Admission: RE | Admit: 2015-12-07 | Discharge: 2015-12-07 | Disposition: A | Discharge status: Home / Self Care | Payer: Medicare Other | Source: Ambulatory Visit | Attending: Neurology | Admitting: Neurology

## 2015-12-07 DIAGNOSIS — R4182 Altered mental status, unspecified: Secondary | ICD-10-CM

## 2015-12-07 DIAGNOSIS — R419 Unspecified symptoms and signs involving cognitive functions and awareness: Secondary | ICD-10-CM | POA: Diagnosis not present

## 2015-12-07 DIAGNOSIS — W19XXXA Unspecified fall, initial encounter: Secondary | ICD-10-CM

## 2015-12-07 MED ORDER — GADOBENATE DIMEGLUMINE 529 MG/ML IV SOLN
18.0000 mL | Freq: Once | INTRAVENOUS | Status: AC | PRN
  Administered 2015-12-07: 18 mL via INTRAVENOUS

## 2015-12-09 DIAGNOSIS — J449 Chronic obstructive pulmonary disease, unspecified: Secondary | ICD-10-CM | POA: Diagnosis not present

## 2015-12-13 ENCOUNTER — Telehealth: Payer: Self-pay | Admitting: *Deleted

## 2015-12-13 NOTE — Telephone Encounter (Signed)
Called and spoke to daughter in law, Lurena JoinerRebecca about normal MRI brain for age per Dr Lucia GaskinsAhern. Reminded her of EEG appt on 12/30/15 at 4pm. She verbalized understanding.

## 2015-12-13 NOTE — Telephone Encounter (Signed)
-----   Message from Anson FretAntonia B Ahern, MD sent at 12/09/2015  8:26 PM EDT ----- MRI of the brain normal for age thanks

## 2015-12-20 ENCOUNTER — Ambulatory Visit (INDEPENDENT_AMBULATORY_CARE_PROVIDER_SITE_OTHER): Payer: Medicare Other | Admitting: Otolaryngology

## 2015-12-20 DIAGNOSIS — J342 Deviated nasal septum: Secondary | ICD-10-CM | POA: Diagnosis not present

## 2015-12-20 DIAGNOSIS — J320 Chronic maxillary sinusitis: Secondary | ICD-10-CM

## 2015-12-20 DIAGNOSIS — J343 Hypertrophy of nasal turbinates: Secondary | ICD-10-CM | POA: Diagnosis not present

## 2015-12-23 ENCOUNTER — Other Ambulatory Visit: Payer: Self-pay | Admitting: Otolaryngology

## 2015-12-30 ENCOUNTER — Telehealth: Payer: Self-pay | Admitting: *Deleted

## 2015-12-30 ENCOUNTER — Ambulatory Visit (INDEPENDENT_AMBULATORY_CARE_PROVIDER_SITE_OTHER): Payer: Medicare Other

## 2015-12-30 DIAGNOSIS — R419 Unspecified symptoms and signs involving cognitive functions and awareness: Secondary | ICD-10-CM

## 2015-12-30 DIAGNOSIS — R4182 Altered mental status, unspecified: Secondary | ICD-10-CM

## 2015-12-30 DIAGNOSIS — R55 Syncope and collapse: Secondary | ICD-10-CM | POA: Diagnosis not present

## 2015-12-30 DIAGNOSIS — W19XXXA Unspecified fall, initial encounter: Secondary | ICD-10-CM

## 2015-12-30 NOTE — Procedures (Signed)
    History:  Victoria AuerbachGeraldine Nihiser is a 60 year old patient with a 12 year history of intermittent episodes of syncope. The patient may fall to the ground with the episodes, the events may occur once every 3 or 4 months. The patient is being evaluated for possible seizure events.  This is a routine EEG. No skull defects are noted. Medications include albuterol inhaler, amitriptyline, aspirin, Wellbutrin, Zyrtec, clonazepam, Flonase, gabapentin, hydrochlorothiazide, Zestril, Mobic, Glucophage, Prilosec, prednisone, Requip, and iron supplementation.   EEG classification: Normal awake and asleep  Description of the recording: The background rhythms of this recording consists of a fairly well modulated medium amplitude background activity of 10 Hz. As the record progresses, the patient initially is in the waking state, but appears to enter the early stage II sleep during the recording, with rudimentary sleep spindles and vertex sharp wave activity seen. During the wakeful state, photic stimulation is performed, and this results in a bilateral and symmetric photic driving response. Hyperventilation was also performed, and this results in a minimal buildup of the background rhythm activities without significant slowing seen. At no time during the recording does there appear to be evidence of spike or spike wave discharges or evidence of focal slowing. EKG monitor shows no evidence of cardiac rhythm abnormalities with a heart rate of 90.  Impression: This is a normal EEG recording in the waking and sleeping state. No evidence of ictal or interictal discharges were seen at any time during the recording.

## 2015-12-30 NOTE — Telephone Encounter (Signed)
-----   Message from Anson FretAntonia B Ahern, MD sent at 12/30/2015  5:52 PM EDT ----- EEG normal, this does not rule out seizures it is only one hour. I would like a 72 hour home eeg and hope to catch one of her episodes. Let me know thanks

## 2015-12-30 NOTE — Telephone Encounter (Signed)
Called and spoke to daughter-in-law, Lurena JoinerRebecca. Relayed results per Dr Lucia GaskinsAhern. She verbalized understanding and is in agreement to do 72-hr AMB EEG via [facility]. She is aware they will call to schedule. She requested they call her after 1:30pm d/t work on any given day. Advised I will put on referral.   Faxed completed form to [facility] to schedule pt for in-home 72-hour EEG. Fax: [phone]. Received confirmation.

## 2016-01-03 NOTE — Telephone Encounter (Signed)
Received physician status notification from [facility] that they received referral and they will contact us once pt is scheduled for 72hr AMG EEG.   

## 2016-01-03 NOTE — Telephone Encounter (Signed)
Dr Ahern- FYI 

## 2016-01-03 NOTE — Telephone Encounter (Signed)
Dr Lucia GaskinsAhernLorain Lara- FYI Received physician status notification from [facility] that the pt is scheduled 01/10/16-01/13/16 for 72hr AMB EEG.

## 2016-01-04 NOTE — Telephone Encounter (Signed)
Sent last OV and EEG to [facility] [phone]) per her request

## 2016-01-08 DIAGNOSIS — J449 Chronic obstructive pulmonary disease, unspecified: Secondary | ICD-10-CM | POA: Diagnosis not present

## 2016-01-10 DIAGNOSIS — R569 Unspecified convulsions: Secondary | ICD-10-CM | POA: Diagnosis not present

## 2016-01-11 DIAGNOSIS — R569 Unspecified convulsions: Secondary | ICD-10-CM | POA: Diagnosis not present

## 2016-01-12 DIAGNOSIS — R569 Unspecified convulsions: Secondary | ICD-10-CM | POA: Diagnosis not present

## 2016-01-17 ENCOUNTER — Ambulatory Visit: Payer: Self-pay | Admitting: "Endocrinology

## 2016-01-17 NOTE — Pre-Procedure Instructions (Signed)
LALANA DELLAROCCA  01/17/2016     Your procedure is scheduled on : Wednesday January 26, 2016 at 9:30 AM.  Report to [facility] Admitting at 7:30 AM.  Call this number if you have problems the morning of surgery: [phone]    Remember:  Do not eat food or drink liquids after midnight.  Take these medicines the morning of surgery with A SIP OF WATER : Acetaminophen (Tylenol) if needed, Albuterol nebulizer if needed, Bupropion (Wellbutrin), Cetirizine (Zyrtec), Citalopram (Celexa), Clonazepam (Klonopin) if needed, Flonase nasal spray, Gabapentin (Neurontin), Omeprazole (Prilosec), Proair inhaler if needed (Bring inhaler with you)   Stop taking any vitamins, herbal medications/supplements, NSAIDs, Ibuprofen, Advil, Motrin, Aleve, Meloxicam/Mobic etc on Wednesday July 26th   Do NOT take any diabetic pills the morning of your surgery (NO Metformin/Glucophage)   How to Manage Your Diabetes Before and After Surgery  Why is it important to control my blood sugar before and after surgery? . Improving blood sugar levels before and after surgery helps healing and can limit problems. . A way of improving blood sugar control is eating a healthy diet by: o  Eating less sugar and carbohydrates o  Increasing activity/exercise o  Talking with your doctor about reaching your blood sugar goals . High blood sugars (greater than 180 mg/dL) can raise your risk of infections and slow your recovery, so you will need to focus on controlling your diabetes during the weeks before surgery. . Make sure that the doctor who takes care of your diabetes knows about your planned surgery including the date and location.  How do I manage my blood sugar before surgery? . Check your blood sugar at least 4 times a day, starting 2 days before surgery, to make sure that the level is not too high or low. o Check your blood sugar the morning of your surgery when you wake up and every 2 hours until you get to the  Short Stay unit. . If your blood sugar is less than 70 mg/dL, you will need to treat for low blood sugar: o Do not take insulin. o Treat a low blood sugar (less than 70 mg/dL) with  cup of clear juice (cranberry or apple), 4 glucose tablets, OR glucose gel. o Recheck blood sugar in 15 minutes after treatment (to make sure it is greater than 70 mg/dL). If your blood sugar is not greater than 70 mg/dL on recheck, call [phone] for further instructions. . Report your blood sugar to the short stay nurse when you get to Short Stay.  . If you are admitted to the hospital after surgery: o Your blood sugar will be checked by the staff and you will probably be given insulin after surgery (instead of oral diabetes medicines) to make sure you have good blood sugar levels. o The goal for blood sugar control after surgery is 80-180 mg/dL.     WHAT DO I DO ABOUT MY DIABETES MEDICATION?  Marland Kitchen Do not take oral diabetes medicines (pills) the morning of surgery.   Do not wear jewelry, make-up or nail polish.  Do not wear lotions, powders, or perfumes.    Do not shave 48 hours prior to surgery.    Do not bring valuables to the hospital.  [facility] is not responsible for any belongings or valuables.  Contacts, dentures or bridgework may not be worn into surgery.  Leave your suitcase in the car.  After surgery it may be brought to your room.  For patients  admitted to the hospital, discharge time will be determined by your treatment team.  Patients discharged the day of surgery will not be allowed to drive home.   Name and phone number of your driver:    Special instructions:  Shower using CHG soap the night before and the morning of your surgery  Please read over the following fact sheets that you were given.

## 2016-01-17 NOTE — Telephone Encounter (Signed)
Dr Lucia GaskinsLorain Childes Received physician status notification on 01/14/16 from [facility] that the study is completed and has been loaded into server. They will send notification within 48 business hrs when report is scanned and ready for report to be generated for Dr Lura Em to review/interpret.

## 2016-01-18 ENCOUNTER — Encounter (HOSPITAL_COMMUNITY)
Admission: RE | Admit: 2016-01-18 | Discharge: 2016-01-18 | Disposition: A | Discharge status: Home / Self Care | Payer: Medicare Other | Source: Ambulatory Visit | Attending: Otolaryngology | Admitting: Otolaryngology

## 2016-01-18 ENCOUNTER — Encounter (HOSPITAL_COMMUNITY): Payer: Self-pay

## 2016-01-18 DIAGNOSIS — E1140 Type 2 diabetes mellitus with diabetic neuropathy, unspecified: Secondary | ICD-10-CM | POA: Insufficient documentation

## 2016-01-18 DIAGNOSIS — Z8673 Personal history of transient ischemic attack (TIA), and cerebral infarction without residual deficits: Secondary | ICD-10-CM | POA: Insufficient documentation

## 2016-01-18 DIAGNOSIS — Z7982 Long term (current) use of aspirin: Secondary | ICD-10-CM | POA: Insufficient documentation

## 2016-01-18 DIAGNOSIS — F419 Anxiety disorder, unspecified: Secondary | ICD-10-CM | POA: Insufficient documentation

## 2016-01-18 DIAGNOSIS — G2581 Restless legs syndrome: Secondary | ICD-10-CM | POA: Insufficient documentation

## 2016-01-18 DIAGNOSIS — J343 Hypertrophy of nasal turbinates: Secondary | ICD-10-CM | POA: Insufficient documentation

## 2016-01-18 DIAGNOSIS — E785 Hyperlipidemia, unspecified: Secondary | ICD-10-CM | POA: Diagnosis not present

## 2016-01-18 DIAGNOSIS — J320 Chronic maxillary sinusitis: Secondary | ICD-10-CM | POA: Diagnosis not present

## 2016-01-18 DIAGNOSIS — I10 Essential (primary) hypertension: Secondary | ICD-10-CM | POA: Insufficient documentation

## 2016-01-18 DIAGNOSIS — K219 Gastro-esophageal reflux disease without esophagitis: Secondary | ICD-10-CM | POA: Diagnosis not present

## 2016-01-18 DIAGNOSIS — F329 Major depressive disorder, single episode, unspecified: Secondary | ICD-10-CM | POA: Insufficient documentation

## 2016-01-18 DIAGNOSIS — Z01812 Encounter for preprocedural laboratory examination: Secondary | ICD-10-CM | POA: Diagnosis not present

## 2016-01-18 DIAGNOSIS — Z01818 Encounter for other preprocedural examination: Secondary | ICD-10-CM | POA: Insufficient documentation

## 2016-01-18 DIAGNOSIS — J342 Deviated nasal septum: Secondary | ICD-10-CM | POA: Insufficient documentation

## 2016-01-18 DIAGNOSIS — Z79899 Other long term (current) drug therapy: Secondary | ICD-10-CM | POA: Insufficient documentation

## 2016-01-18 HISTORY — DX: Bronchitis, not specified as acute or chronic: J40

## 2016-01-18 HISTORY — DX: Gastro-esophageal reflux disease without esophagitis: K21.9

## 2016-01-18 HISTORY — DX: Transient cerebral ischemic attack, unspecified: G45.9

## 2016-01-18 HISTORY — DX: Anxiety disorder, unspecified: F41.9

## 2016-01-18 HISTORY — DX: Pneumonia, unspecified organism: J18.9

## 2016-01-18 HISTORY — DX: Reserved for inherently not codable concepts without codable children: IMO0001

## 2016-01-18 HISTORY — DX: Syncope and collapse: R55

## 2016-01-18 HISTORY — DX: Unspecified osteoarthritis, unspecified site: M19.90

## 2016-01-18 HISTORY — DX: Polyneuropathy, unspecified: G62.9

## 2016-01-18 HISTORY — DX: Restless legs syndrome: G25.81

## 2016-01-18 HISTORY — DX: Anemia, unspecified: D64.9

## 2016-01-18 LAB — CBC
HEMATOCRIT: 43.8 % (ref 36.0–46.0)
Hemoglobin: 14.9 g/dL (ref 12.0–15.0)
MCH: 33.3 pg (ref 26.0–34.0)
MCHC: 34 g/dL (ref 30.0–36.0)
MCV: 98 fL (ref 78.0–100.0)
PLATELETS: 240 10*3/uL (ref 150–400)
RBC: 4.47 MIL/uL (ref 3.87–5.11)
RDW: 12.9 % (ref 11.5–15.5)
WBC: 9.8 10*3/uL (ref 4.0–10.5)

## 2016-01-18 LAB — GLUCOSE, CAPILLARY: Glucose-Capillary: 259 mg/dL — ABNORMAL HIGH (ref 65–99)

## 2016-01-18 LAB — BASIC METABOLIC PANEL
ANION GAP: 10 (ref 5–15)
BUN: 10 mg/dL (ref 6–20)
CO2: 27 mmol/L (ref 22–32)
Calcium: 9.6 mg/dL (ref 8.9–10.3)
Chloride: 98 mmol/L — ABNORMAL LOW (ref 101–111)
Creatinine, Ser: 0.63 mg/dL (ref 0.44–1.00)
GFR calc Af Amer: >60 mL/min (ref >60)
GLUCOSE: 245 mg/dL — AB (ref 65–99)
POTASSIUM: 4.7 mmol/L (ref 3.5–5.1)
Sodium: 135 mmol/L (ref 135–145)

## 2016-01-18 NOTE — Progress Notes (Signed)
PCP is Dr. Sinda Du at [facility]  Patient denied having any acute cardiac issues, but did inform Nurse that she has bronchitis and uses inhalers when needed. Patient denied having any shortness of breath at this time.   Nurse inquired about fasting blood glucose levels and patient informed Nurse that she does not check her blood glucose levels at home because it will cause her "more anxiety," and stated her last A1c was eleven approximately 2-3 months ago. CBG on arrival to PAT was 259 and patient stated she had just consumed a Chalupa from [employer] and drank a Diet [facility] for lunch. According to [facility] patient is scheduled to see Dr. Purcell Nails (Endocrinologist) on 01/20/16. Revonda Standard, [place] informed of this and requested that patient check blood glucose levels in the mornings for the next few days before food consumption, so patient could have a idea of what her fasting blood glucose typically ranges. Patient informed of this and stated she would do it.   Patient also stated she had a stress test last year at her PCP's office. Will request records. Patient denied having a cardiac cath, but did state she is scheduled to have a sleep study soon.  Patient's daughter in law accompanied patient to PAT and informed Nurse that patient "passes out every two to three months and it has been happening for the past 12 years." Patient recently saw Dr. Naomie Dean (Neurologist) and had a EEG performed. Patients daughter in law stated the last syncopal episode occurred on 12/23/15 while they were in [place]. Patient stated she thinks the syncopal episode is related to "anxiety," and patients daughter in law stated "she'll pass out and we'll leave here there for a few because if we get her up to quickly she'll pass out again...Marland KitchenMarland Kitchenthen she'll come back around." Patient stated she had heart studies performed about 15 or 20 years ago at [facility], however the results were inconclusive.    Will send chart to Anesthesia for review.

## 2016-01-19 LAB — HEMOGLOBIN A1C
HEMOGLOBIN A1C: 9.3 % — AB (ref 4.8–5.6)
Mean Plasma Glucose: 220 mg/dL

## 2016-01-20 ENCOUNTER — Encounter: Payer: Self-pay | Admitting: "Endocrinology

## 2016-01-20 ENCOUNTER — Ambulatory Visit (INDEPENDENT_AMBULATORY_CARE_PROVIDER_SITE_OTHER): Payer: Medicare Other | Admitting: "Endocrinology

## 2016-01-20 VITALS — BP 134/81 | HR 91 | Ht 61.0 in | Wt 188.0 lb

## 2016-01-20 DIAGNOSIS — Z720 Tobacco use: Secondary | ICD-10-CM

## 2016-01-20 DIAGNOSIS — I10 Essential (primary) hypertension: Secondary | ICD-10-CM | POA: Diagnosis not present

## 2016-01-20 DIAGNOSIS — E1165 Type 2 diabetes mellitus with hyperglycemia: Secondary | ICD-10-CM | POA: Diagnosis not present

## 2016-01-20 DIAGNOSIS — E785 Hyperlipidemia, unspecified: Secondary | ICD-10-CM

## 2016-01-20 DIAGNOSIS — E1159 Type 2 diabetes mellitus with other circulatory complications: Secondary | ICD-10-CM

## 2016-01-20 DIAGNOSIS — IMO0002 Reserved for concepts with insufficient information to code with codable children: Secondary | ICD-10-CM

## 2016-01-20 DIAGNOSIS — E782 Mixed hyperlipidemia: Secondary | ICD-10-CM | POA: Insufficient documentation

## 2016-01-20 DIAGNOSIS — F17200 Nicotine dependence, unspecified, uncomplicated: Secondary | ICD-10-CM | POA: Insufficient documentation

## 2016-01-20 MED ORDER — ATORVASTATIN CALCIUM 20 MG PO TABS: 20.0000 mg | ORAL_TABLET | Freq: Every day | ORAL | 3 refills | Status: DC

## 2016-01-20 NOTE — Progress Notes (Addendum)
Anesthesia Chart Review: Patient is a 60 year old female scheduled for septoplasty, bilateral turbinate reduction, bilateral endoscopic maxillary antrostomy on 01/26/16 by Dr. Suszanne Conners.  History includes diabetes mellitus type 2, hyperlipidemia, anemia, anxiety, depression, GERD, hypertension, neuropathy, TIA '06, syncope (intermittently X 12 years; recent evaluation by neurology), shortness of breath, RLS, smoking. Reported that she had to go to the ED after shoulder surgery for oxygen desaturations and difficulty waking her up from anesthesia.   - PCP is Dr. Argentina Ponder at [facility]. - Endocrinologist is Dr. Fransico Him, newly established on 01/20/16. She wrote: She is reporting that she has an elective planned sinus surgery next week. Her average current glycemic profile is between 212 and 240 . -She is not open for postponement of her sinus surgery. - She is hesitantly accepting insulin treatment to lower her average blood glucose to below 200 to avoid risk of wound infection from her surgery.  - I  will proceed to initiate strict monitoring of glucose  AC and HS. -Patient is encouraged to call clinic for blood glucose levels less than 70 or above 200 mg /dl. - I gave her a sample of basal insulin, Toujeo 30 units daily at bedtime. - I will continue metformin 850 mg by mouth twice a day, therapeutically suitable for patient. Neurologist is Dr. Naomie Dean, established 11/24/15 for evaluation of "syncope." Described as "syncope for 12 years. She passes out about 5-7 second and then wakes up, falls to the ground." No murmur heard on exam. MRI and EEG ordered (see bleow)  Meds include albuterol, amitriptyline, aspirin 325 mg, Wellbutrin SR, Celexa, Zyrtec, clonazepam, 65 FE, Flonase, gabapentin, HCTZ, lisinopril, metformin, Singulair, Prilosec, pro-air, Requip. She was given samples of Toujeo today.  PAT Vitals: BP 152/68, HR 94, RR 20, T 36.8C, O2 sat 99%. CBG 259 (non-fasting).  01/18/16 EKG: NSR.  She  reported a "stress test" last year at [facility]. She reports she has an upcoming sleep study.   12/30/15 EEG: Impression: This is a normal EEG recording in the waking and sleeping state. No evidence of ictal or interictal discharges were seen at any time during the recording. (Results reviewed by Dr. Lucia Gaskins who wrote, "EEG normal, this does not rule out seizures it is only one hour. I would like a 72 hour home eeg and hope to catch one of her episodes." Done 01/10/16-01/13/16. Report apparently received today and forwarded to Dr. Lucia Gaskins to review.   12/07/15 MRI Brain: Impression: Equivocal MRI brain (with and without) demonstrating: 1. Mild periventricular and subcortical foci of non-specific gliosis. No abnormal lesions are seen on post contrast views. 2. No acute findings.  Preoperative labs noted. Cr 0.63. CBC WNL. Glucose 245. A1c 9.3 (down from 10.2 on 11/16/15), consistent with a mean plasma glucose of 220. (Patient has some anxiety about doing home CBG, but she was asked to get fasting results between now and surgery because a fasting much over 200 could get her surgery postponed or canceled. Dr. Fransico Him went over her own instructions as well.)  PCP records are still pending. Will follow-up and revisit chart once records received.  Velna Ochs [facility] Short Stay Center/Anesthesiology Phone [phone] 01/20/2016 6:10 PM  Addendum: Records received from [facility] [place]/Dr. Earnestine Leys) in 2008 for evaluation of recurrent syncope. Vasovagal vs arrhythmia vs TIA vs cardiogenic PE. I did not receive a CTA report (but not reported history of PE). No event montior report received. There following records were received:  -  01/27/07 Nuclear stress test: Interpretation: Clinically and electrocardiographically negative for ischemia. Patient was able to achiever 7 METS and had good exercise tolerance. Calculated DUKE Exercise Treadmill Score was > 5 indicative  of low risk. Perfusion data was essentially negative for ischemia. EF 58%.  - 01/24/07 Echo: Conclusions: Normal LV size and contraction with EF of 55-60%. Nondiagnostic for wall motion abnormalities. No significant valvular lesions. No pericardial effusion.  - 01/24/07 Carotid U/S: No hemodynamically significant plaque or stenosis within the cervical CCA or ICAs bilaterally.   Records received from [facility]. Last visit with Dr. Leandrew Koyanagi on 12/06/15.   Above reviewed with anesthesiologist Dr. Glade Stanford. Recommend touching base with neurology for their input prior to surgery. Dr. Trevor Mace office is closed for the weekend already, so I sent him a high priority staff message.   Velna Ochs [facility] Short Stay Center/Anesthesiology Phone [phone] 01/21/2016 4:40 PM  Addendum: I received the following staff message from Dr. Lucia Gaskins, "Sherald Barge, The 72 hour EEG was just completed and it was normal. I don't know why she is having these episodes but they have been ongoing for 12 years and work up to date negative so I don't think I have anymore tests to add right now and she can go forward with surgery. I would like to see her back in the office for follow up appointment to discuss the 3-day eeg but this won't impact your surgery, she can go forward and is cleared from my perspective. Thanks Artemio Aly"   I have updated Kim at Dr. Avel Sensor office of patient's historically poor DM control but with recent endocrinology visit with addition of insulin, in hopes to get glucose in an acceptable range for surgery. I also updated her of neurology's input. Further evaluation on the day of surgery by her surgeon and anesthesiologist. She will get a fasting CBG on arrival. If results close to 200 or better and otherwise no acute changes then I would anticipate that she could proceed as planned.  Velna Ochs [facility] Short Stay Center/Anesthesiology Phone [phone] 01/25/2016 9:24 AM

## 2016-01-20 NOTE — Telephone Encounter (Signed)
Received physician status notification with attached results/report from Dr Lura Em. Gave to Dr Lucia Gaskins for review.

## 2016-01-20 NOTE — Progress Notes (Signed)
Subjective:    Patient ID: Victoria Lara, female    DOB: 1955/07/18. Patient is being seen in consultation for management of diabetes requested by  Estanislado Pandy, MD  Past Medical History:  Diagnosis Date  . Anemia   . Anxiety   . Arthritis   . Bronchitis   . Complication of anesthesia    had to come back to ER after shoulder surgery at [facility]; hard to wake up; low O2 sats  . Depression   . Diabetes mellitus without complication (HCC)   . GERD (gastroesophageal reflux disease)   . Hypertension   . Neuropathy (HCC)    Takes Gabapentin  . Pneumonia 2016  . Restless leg syndrome   . Seasonal allergies   . Shortness of breath dyspnea   . Syncope   . TIA (transient ischemic attack) 2006   Past Surgical History:  Procedure Laterality Date  . BREAST LUMPECTOMY Bilateral   . CATARACT EXTRACTION W/PHACO Right 08/20/2014   Procedure: CATARACT EXTRACTION PHACO AND INTRAOCULAR LENS PLACEMENT (IOC);  Surgeon: Gemma Payor, MD;  Location: [facility];  Service: Ophthalmology;  Laterality: Right;  CDE:5.31  . HAND SURGERY Bilateral   . SHOULDER ARTHROSCOPY WITH ROTATOR CUFF REPAIR Right   . TUBAL LIGATION     Social History   Social History  . Marital status: Widowed    Spouse name: N/A  . Number of children: 1  . Years of education: 46   Occupational History  . Unemployed    Social History Main Topics  . Smoking status: Current Every Day Smoker    Packs/day: 0.50    Years: 41.00    Types: Cigarettes  . Smokeless tobacco: Current User  . Alcohol use No  . Drug use: No  . Sexual activity: Not Asked   Other Topics Concern  . None   Social History Narrative   Lives with boyfriend   Caffeine use: sometimes per pt   Outpatient Encounter Prescriptions as of 01/20/2016  Medication Sig  . Insulin Glargine 300 UNIT/ML SOPN Inject 30 Units into the skin at bedtime.  Marland Kitchen acetaminophen (TYLENOL) 500 MG tablet Take 1,000 mg by mouth every 6 (six) hours as needed for mild pain.   Marland Kitchen albuterol (PROVENTIL) (2.5 MG/3ML) 0.083% nebulizer solution Take 3 mLs (2.5 mg total) by nebulization every 6 (six) hours as needed for wheezing.  Marland Kitchen amitriptyline (ELAVIL) 25 MG tablet Take 25 mg by mouth at bedtime.  Marland Kitchen aspirin EC 325 MG tablet Take 325 mg by mouth every morning.   Marland Kitchen atorvastatin (LIPITOR) 20 MG tablet Take 1 tablet (20 mg total) by mouth daily.  Marland Kitchen buPROPion (WELLBUTRIN SR) 150 MG 12 hr tablet Take 150 mg by mouth 2 (two) times daily.  . cetirizine (ZYRTEC) 10 MG tablet Take 10 mg by mouth daily.  . citalopram (CELEXA) 10 MG tablet Take 10 mg by mouth daily.  . clonazePAM (KLONOPIN) 0.5 MG tablet Take 0.25-0.5 mg by mouth every 8 (eight) hours as needed for anxiety. for anxiety  . ferrous sulfate 325 (65 FE) MG tablet Take 325 mg by mouth 2 (two) times daily with a meal.  . fluticasone (FLONASE) 50 MCG/ACT nasal spray Place 1 spray into both nostrils daily as needed for allergies.   Marland Kitchen gabapentin (NEURONTIN) 300 MG capsule Take 300 mg by mouth 2 (two) times daily.  . hydrochlorothiazide (MICROZIDE) 12.5 MG capsule Take 12.5 mg by mouth daily.  Marland Kitchen lisinopril (PRINIVIL,ZESTRIL) 20 MG tablet Take 20 mg  by mouth daily.  . meloxicam (MOBIC) 15 MG tablet Take 15 mg by mouth daily.  . metFORMIN (GLUCOPHAGE) 850 MG tablet Take 850 mg by mouth 2 (two) times daily with a meal.  . montelukast (SINGULAIR) 10 MG tablet Take 10 mg by mouth at bedtime.  Marland Kitchen omeprazole (PRILOSEC) 20 MG capsule Take 20 mg by mouth daily.  Marland Kitchen PROAIR HFA 108 (90 Base) MCG/ACT inhaler Inhale 2 puffs into the lungs every 4 (four) hours as needed for wheezing or shortness of breath.   Marland Kitchen rOPINIRole (REQUIP) 0.25 MG tablet Take 0.25 mg by mouth every evening.    No facility-administered encounter medications on file as of 01/20/2016.    ALLERGIES: No Known Allergies VACCINATION STATUS:  There is no immunization history on file for this patient.  Diabetes  She presents for her initial diabetic visit. She has type  2 diabetes mellitus. Onset time: She was diagnosed at approximate age of 60 years. Her disease course has been worsening. There are no hypoglycemic associated symptoms. Pertinent negatives for hypoglycemia include no confusion, headaches, pallor or seizures. Associated symptoms include blurred vision, fatigue, polydipsia and polyuria. Pertinent negatives for diabetes include no chest pain and no polyphagia. There are no hypoglycemic complications. Symptoms are worsening. Diabetic complications include a CVA. (She is a chronic heavy smoker.) Risk factors for coronary artery disease include diabetes mellitus, dyslipidemia, hypertension, obesity, sedentary lifestyle and tobacco exposure. Current diabetic treatment includes oral agent (monotherapy). Her weight is increasing steadily. She is following a generally unhealthy diet. When asked about meal planning, she reported none. She rarely participates in exercise. Home blood sugar record trend: She did not bring the meter nor log to review today. She admits that she has started to use her meter only yesterday. An ACE inhibitor/angiotensin II receptor blocker is being taken. Eye exam is current.  Hyperlipidemia  This is a new diagnosis. The problem is uncontrolled. Recent lipid tests were reviewed and are high. Exacerbating diseases include diabetes and obesity. Pertinent negatives include no chest pain, myalgias or shortness of breath. She is currently on no antihyperlipidemic treatment. Risk factors for coronary artery disease include diabetes mellitus, dyslipidemia, hypertension and a sedentary lifestyle.  Hypertension  This is a chronic problem. The current episode started more than 1 year ago. Associated symptoms include blurred vision. Pertinent negatives include no chest pain, headaches, palpitations or shortness of breath. Risk factors for coronary artery disease include diabetes mellitus, dyslipidemia, sedentary lifestyle and smoking/tobacco exposure. Past  treatments include ACE inhibitors. Hypertensive end-organ damage includes CVA.       Review of Systems  Constitutional: Positive for fatigue. Negative for activity change, chills, fever and unexpected weight change.  HENT: Negative for trouble swallowing and voice change.   Eyes: Positive for blurred vision. Negative for visual disturbance.  Respiratory: Negative for cough, shortness of breath and wheezing.   Cardiovascular: Negative for chest pain, palpitations and leg swelling.  Gastrointestinal: Negative for diarrhea, nausea and vomiting.  Endocrine: Positive for polydipsia and polyuria. Negative for cold intolerance, heat intolerance and polyphagia.  Genitourinary: Positive for frequency. Negative for dysuria and flank pain.  Musculoskeletal: Negative for arthralgias and myalgias.  Skin: Negative for color change, pallor, rash and wound.  Neurological: Negative for seizures and headaches.  Psychiatric/Behavioral: Negative for confusion, dysphoric mood and suicidal ideas.  All other systems reviewed and are negative.   Objective:    BP 134/81   Pulse 91   Ht 5\' 1"  (1.549 m)   Wt 188 lb (  85.3 kg)   BMI 35.52 kg/m   Wt Readings from Last 3 Encounters:  01/20/16 188 lb (85.3 kg)  01/18/16 189 lb 9.6 oz (86 kg)  11/24/15 190 lb 3.2 oz (86.3 kg)    Physical Exam  Constitutional: She is oriented to person, place, and time. She appears well-developed.  HENT:  Head: Normocephalic and atraumatic.  Eyes: EOM are normal.  Neck: Normal range of motion. Neck supple. No tracheal deviation present. No thyromegaly present.  Cardiovascular: Normal rate and regular rhythm.   Pulmonary/Chest: Effort normal and breath sounds normal.  Abdominal: Soft. Bowel sounds are normal. There is no tenderness. There is no guarding.  Musculoskeletal: Normal range of motion. She exhibits no edema.  Neurological: She is alert and oriented to person, place, and time. She has normal reflexes. No cranial  nerve deficit. Coordination normal.  Skin: Skin is warm and dry. No rash noted. No erythema. No pallor.  Psychiatric:  Hesitant affect.     CMP ( most recent) CMP     Component Value Date/Time   NA 135 01/18/2016 1630   NA 140 11/24/2015 1624   K 4.7 01/18/2016 1630   CL 98 (L) 01/18/2016 1630   CO2 27 01/18/2016 1630   GLUCOSE 245 (H) 01/18/2016 1630   BUN 10 01/18/2016 1630   BUN 24 11/24/2015 1624   CREATININE 0.63 01/18/2016 1630   CALCIUM 9.6 01/18/2016 1630   PROT 6.6 11/24/2015 1624   ALBUMIN 4.2 11/24/2015 1624   AST 25 11/24/2015 1624   ALT 24 11/24/2015 1624   ALKPHOS 79 11/24/2015 1624   BILITOT 0.4 11/24/2015 1624   GFRNONAA >60 01/18/2016 1630   GFRAA >60 01/18/2016 1630     Diabetic Labs (most recent): Lab Results  Component Value Date   HGBA1C 9.3 (H) 01/18/2016   HGBA1C 10.2 11/16/2015   Lipid Panel     Component Value Date/Time   CHOL 174 11/16/2015   TRIG 213 (A) 11/16/2015   HDL 32 (A) 11/16/2015   LDLCALC 99 11/16/2015       Assessment & Plan:   1. Uncontrolled type 2 diabetes mellitus with other circulatory complication, without long-term current use of insulin (HCC)   - Patient has currently uncontrolled symptomatic type 2 DM since  60 years of age,  with most recent A1c of 9.3 %. Recent labs reviewed.   Her diabetes is complicated by TIA, chronic heavy smoking and patient remains at a high risk for more acute and chronic complications of diabetes which include CAD, CVA, CKD, retinopathy, and neuropathy. These are all discussed in detail with the patient.  - I have counseled the patient on diet management and weight loss, by adopting a carbohydrate restricted/protein rich diet.  - Suggestion is made for patient to avoid simple carbohydrates   from their diet including Cakes , Desserts, Ice Cream,  Soda (  diet and regular) , Sweet Tea , Candies,  Chips, Cookies, Artificial Sweeteners,   and "Sugar-free" Products . This will help  patient to have stable blood glucose profile and potentially avoid unintended weight gain.  - I encouraged the patient to switch to  unprocessed or minimally processed complex starch and increased protein intake (animal or plant source), fruits, and vegetables.  - Patient is advised to stick to a routine mealtimes to eat 3 meals  a day and avoid unnecessary snacks ( to snack only to correct hypoglycemia).  - The patient will be scheduled with Norm Salt, RDN, CDE for individualized  DM education.  - I have approached patient with the following individualized plan to manage diabetes and patient agrees:   - She is reporting that she has an elective planned sinus surgery next week. Her average current glycemic profile is between 212 and 240 . -She is not open for postponement of her sinus surgery. - She is hesitantly accepting insulin treatment to lower her average blood glucose to below 200 to avoid risk of wound infection from her surgery.  - I  will proceed to initiate strict monitoring of glucose  AC and HS. -Patient is encouraged to call clinic for blood glucose levels less than 70 or above 200 mg /dl. - I gave her a sample of basal insulin, Toujeo 30 units daily at bedtime. - I will continue metformin 850 mg by mouth twice a day, therapeutically suitable for patient.  - Patient will be considered for incretin therapy as appropriate next visit. - Patient specific target  A1c;  LDL, HDL, Triglycerides, and  Waist Circumference were discussed in detail.  2) BP/HTN: Controlled. Continue current medications including ACEI/ARB. 3) Lipids/HPL:  Uncontrolled, triglycerides 213, I have added atorvastatin 20 mg by mouth daily at bedtime.  4)  Weight/Diet: CDE Consult will be initiated , exercise, and detailed carbohydrates information provided.  5) Chronic Care/Health Maintenance:  -Patient is on ACEI and Statin medications and encouraged to continue to follow up with Ophthalmology, Podiatrist  at least yearly or according to recommendations, and advised to  quit smoking. I have recommended yearly flu vaccine and pneumonia vaccination at least every 5 years; moderate intensity exercise for up to 150 minutes weekly; and  sleep for at least 7 hours a day.  - 60 minutes of time was spent on the care of this patient , 50% of which was applied for counseling on diabetes complications and their preventions.  - Patient to bring meter and  blood glucose logs during their next visit.   - I advised patient to maintain close follow up with Estanislado Pandy, MD for primary care needs.  Follow up plan: - Return in about 1 week (around 01/27/2016) for follow up with meter and logs- no labs.  Marquis Lunch, MD Phone: [phone]  Fax: [phone]   01/20/2016, 3:58 PM

## 2016-01-24 ENCOUNTER — Telehealth: Payer: Self-pay | Admitting: "Endocrinology

## 2016-01-24 ENCOUNTER — Telehealth: Payer: Self-pay | Admitting: Neurology

## 2016-01-24 NOTE — Telephone Encounter (Signed)
She has previously untreated high cholesterol and it is a  standard of care for patients with diabetes to treat cholesterol to target. And Lipitor is her best option.

## 2016-01-24 NOTE — Telephone Encounter (Signed)
Pt needs meter sent into pharmacy please

## 2016-01-24 NOTE — Telephone Encounter (Signed)
Pt is confused on why she was put on lipitor - she states that she was not aware of the medication change. Dr. Fransico Him, please let Tammy/Kim know what you would like them to tell her or call the patient back.

## 2016-01-24 NOTE — Telephone Encounter (Signed)
Left message to return call 

## 2016-01-24 NOTE — Telephone Encounter (Signed)
Dr Ahern- please advise 

## 2016-01-24 NOTE — Telephone Encounter (Signed)
Patient informed. 

## 2016-01-24 NOTE — Telephone Encounter (Signed)
Patient just had a 3-day ambulatory EEG completed which was normal. She needs follow up in the office, unclear reasons for her syncopal episodes.  Victoria Lara, the 3-day eeg was normal. There was one push button event. Can you let patient know but also see if she had any of her spells while on the eeg please? She likely needs a follow up in the office to discuss as well. Thanks.

## 2016-01-24 NOTE — Telephone Encounter (Signed)
Revonda Standard at the [facility] is calling and states the patient is having sinus surgery on August 2nd.  She wants to know if the patient needs any other tests done from a neurological sense.  Please call.

## 2016-01-25 ENCOUNTER — Other Ambulatory Visit: Payer: Self-pay | Admitting: "Endocrinology

## 2016-01-25 MED ORDER — GLUCOSE BLOOD VI STRP: ORAL_STRIP | 3 refills | Status: AC

## 2016-01-25 MED ORDER — ACCU-CHEK AVIVA DEVI: 0 refills | Status: AC

## 2016-01-25 NOTE — Telephone Encounter (Signed)
Called and spoke to [place]. Revonda Standard not available. Marylene Land shares office with Revonda Standard. She checked status in pt chart and states they received message from Dr. Lucia Gaskins that pt is cleared from a neurological standpoint for surgery. Advised I was calling to make sure they received messaged. She verbalized understanding.

## 2016-01-25 NOTE — Telephone Encounter (Signed)
Called and LVM for pt to call. Gave [facility] phone number.

## 2016-01-25 NOTE — Telephone Encounter (Signed)
done

## 2016-01-26 ENCOUNTER — Encounter (HOSPITAL_COMMUNITY): Payer: Self-pay | Admitting: *Deleted

## 2016-01-26 ENCOUNTER — Ambulatory Visit (HOSPITAL_COMMUNITY)
Admission: RE | Admit: 2016-01-26 | Discharge: 2016-01-27 | Disposition: A | Discharge status: Home / Self Care | Payer: Medicare Other | Source: Ambulatory Visit | Attending: Otolaryngology | Admitting: Otolaryngology

## 2016-01-26 ENCOUNTER — Ambulatory Visit (HOSPITAL_COMMUNITY): Payer: Medicare Other | Admitting: Vascular Surgery

## 2016-01-26 ENCOUNTER — Encounter (HOSPITAL_COMMUNITY)
Admission: RE | Disposition: A | Discharge status: Home / Self Care | Payer: Self-pay | Source: Ambulatory Visit | Attending: Otolaryngology

## 2016-01-26 ENCOUNTER — Ambulatory Visit (HOSPITAL_COMMUNITY): Payer: Medicare Other | Admitting: Anesthesiology

## 2016-01-26 DIAGNOSIS — Z7982 Long term (current) use of aspirin: Secondary | ICD-10-CM | POA: Diagnosis not present

## 2016-01-26 DIAGNOSIS — Z794 Long term (current) use of insulin: Secondary | ICD-10-CM | POA: Insufficient documentation

## 2016-01-26 DIAGNOSIS — E119 Type 2 diabetes mellitus without complications: Secondary | ICD-10-CM | POA: Diagnosis not present

## 2016-01-26 DIAGNOSIS — J449 Chronic obstructive pulmonary disease, unspecified: Secondary | ICD-10-CM | POA: Diagnosis not present

## 2016-01-26 DIAGNOSIS — K219 Gastro-esophageal reflux disease without esophagitis: Secondary | ICD-10-CM | POA: Insufficient documentation

## 2016-01-26 DIAGNOSIS — Z8673 Personal history of transient ischemic attack (TIA), and cerebral infarction without residual deficits: Secondary | ICD-10-CM | POA: Diagnosis not present

## 2016-01-26 DIAGNOSIS — J320 Chronic maxillary sinusitis: Secondary | ICD-10-CM

## 2016-01-26 DIAGNOSIS — I10 Essential (primary) hypertension: Secondary | ICD-10-CM | POA: Insufficient documentation

## 2016-01-26 DIAGNOSIS — J3489 Other specified disorders of nose and nasal sinuses: Secondary | ICD-10-CM | POA: Diagnosis not present

## 2016-01-26 DIAGNOSIS — J342 Deviated nasal septum: Secondary | ICD-10-CM | POA: Diagnosis not present

## 2016-01-26 DIAGNOSIS — J343 Hypertrophy of nasal turbinates: Secondary | ICD-10-CM

## 2016-01-26 DIAGNOSIS — Z9889 Other specified postprocedural states: Secondary | ICD-10-CM

## 2016-01-26 HISTORY — PX: NASAL SEPTOPLASTY W/ TURBINOPLASTY: SHX2070

## 2016-01-26 HISTORY — PX: NASAL SEPTUM SURGERY: SHX37

## 2016-01-26 HISTORY — PX: NASAL SINUS SURGERY: SHX719

## 2016-01-26 LAB — GLUCOSE, CAPILLARY
GLUCOSE-CAPILLARY: 328 mg/dL — AB (ref 65–99)
GLUCOSE-CAPILLARY: 213 mg/dL — AB (ref 65–99)
Glucose-Capillary: 193 mg/dL — ABNORMAL HIGH (ref 65–99)
Glucose-Capillary: 219 mg/dL — ABNORMAL HIGH (ref 65–99)

## 2016-01-26 SURGERY — SEPTOPLASTY, NOSE, WITH NASAL TURBINATE REDUCTION
Anesthesia: General | Site: Nose | Laterality: Bilateral

## 2016-01-26 MED ORDER — POTASSIUM CHLORIDE IN NACL 20-0.45 MEQ/L-% IV SOLN
INTRAVENOUS | Status: DC
  Administered 2016-01-26: 16:00:00 via INTRAVENOUS
  Filled 2016-01-26 (×2): qty 1000

## 2016-01-26 MED ORDER — BUPROPION HCL ER (SR) 150 MG PO TB12
150.0000 mg | ORAL_TABLET | Freq: Two times a day (BID) | ORAL | Status: DC
  Administered 2016-01-26: 150 mg via ORAL
  Filled 2016-01-26: qty 1

## 2016-01-26 MED ORDER — PROPOFOL 10 MG/ML IV BOLUS
INTRAVENOUS | Status: DC | PRN
  Administered 2016-01-26: 150 mg via INTRAVENOUS

## 2016-01-26 MED ORDER — ATORVASTATIN CALCIUM 20 MG PO TABS
20.0000 mg | ORAL_TABLET | Freq: Every day | ORAL | Status: DC
  Administered 2016-01-26: 20 mg via ORAL
  Filled 2016-01-26: qty 1

## 2016-01-26 MED ORDER — EPHEDRINE SULFATE 50 MG/ML IJ SOLN
INTRAMUSCULAR | Status: DC | PRN
  Administered 2016-01-26: 10 mg via INTRAVENOUS

## 2016-01-26 MED ORDER — MIDAZOLAM HCL 2 MG/2ML IJ SOLN
INTRAMUSCULAR | Status: AC
  Filled 2016-01-26: qty 2

## 2016-01-26 MED ORDER — LIDOCAINE-EPINEPHRINE 1 %-1:100000 IJ SOLN
INTRAMUSCULAR | Status: DC | PRN
  Administered 2016-01-26: 6 mL

## 2016-01-26 MED ORDER — PROMETHAZINE HCL 25 MG PO TABS: 25.0000 mg | ORAL_TABLET | Freq: Four times a day (QID) | ORAL | Status: DC | PRN

## 2016-01-26 MED ORDER — LACTATED RINGERS IV SOLN
INTRAVENOUS | Status: DC
  Administered 2016-01-26: 08:00:00 via INTRAVENOUS

## 2016-01-26 MED ORDER — OXYCODONE HCL 5 MG/5ML PO SOLN: 5.0000 mg | Freq: Once | ORAL | Status: AC | PRN

## 2016-01-26 MED ORDER — OXYCODONE HCL 5 MG PO TABS
ORAL_TABLET | ORAL | Status: AC
  Filled 2016-01-26: qty 1

## 2016-01-26 MED ORDER — AMOXICILLIN 875 MG PO TABS: 875.0000 mg | ORAL_TABLET | Freq: Two times a day (BID) | ORAL | 0 refills | Status: DC

## 2016-01-26 MED ORDER — PROMETHAZINE HCL 25 MG RE SUPP: 25.0000 mg | Freq: Four times a day (QID) | RECTAL | Status: DC | PRN

## 2016-01-26 MED ORDER — OXYMETAZOLINE HCL 0.05 % NA SOLN
NASAL | Status: AC
  Filled 2016-01-26: qty 15

## 2016-01-26 MED ORDER — CEFAZOLIN SODIUM-DEXTROSE 2-4 GM/100ML-% IV SOLN
2.0000 g | Freq: Once | INTRAVENOUS | Status: AC
  Administered 2016-01-26: 2 g via INTRAVENOUS
  Filled 2016-01-26 (×2): qty 100

## 2016-01-26 MED ORDER — HYDROMORPHONE HCL 1 MG/ML IJ SOLN
0.2500 mg | INTRAMUSCULAR | Status: DC | PRN
  Administered 2016-01-26 (×2): 0.5 mg via INTRAVENOUS

## 2016-01-26 MED ORDER — OXYCODONE HCL 5 MG PO TABS
5.0000 mg | ORAL_TABLET | Freq: Once | ORAL | Status: AC | PRN
  Administered 2016-01-26: 5 mg via ORAL

## 2016-01-26 MED ORDER — OXYCODONE-ACETAMINOPHEN 5-325 MG PO TABS: 1.0000 | ORAL_TABLET | ORAL | 0 refills | Status: DC | PRN

## 2016-01-26 MED ORDER — ALBUTEROL SULFATE (2.5 MG/3ML) 0.083% IN NEBU: 2.5000 mg | INHALATION_SOLUTION | RESPIRATORY_TRACT | Status: DC | PRN

## 2016-01-26 MED ORDER — SUGAMMADEX SODIUM 200 MG/2ML IV SOLN
INTRAVENOUS | Status: DC | PRN
  Administered 2016-01-26: 168.8 mg via INTRAVENOUS

## 2016-01-26 MED ORDER — MELOXICAM 7.5 MG PO TABS
15.0000 mg | ORAL_TABLET | Freq: Every day | ORAL | Status: DC
  Administered 2016-01-26: 15 mg via ORAL
  Filled 2016-01-26: qty 2

## 2016-01-26 MED ORDER — AMITRIPTYLINE HCL 25 MG PO TABS
25.0000 mg | ORAL_TABLET | Freq: Every day | ORAL | Status: DC
  Administered 2016-01-26: 25 mg via ORAL
  Filled 2016-01-26: qty 1

## 2016-01-26 MED ORDER — LIDOCAINE-EPINEPHRINE 1 %-1:100000 IJ SOLN
INTRAMUSCULAR | Status: AC
  Filled 2016-01-26: qty 1

## 2016-01-26 MED ORDER — BACITRACIN ZINC 500 UNIT/GM EX OINT
TOPICAL_OINTMENT | CUTANEOUS | Status: DC | PRN
  Administered 2016-01-26: 1 via TOPICAL

## 2016-01-26 MED ORDER — INSULIN ASPART 100 UNIT/ML ~~LOC~~ SOLN
0.0000 [IU] | Freq: Three times a day (TID) | SUBCUTANEOUS | Status: DC
  Administered 2016-01-26: 11 [IU] via SUBCUTANEOUS
  Administered 2016-01-27: 3 [IU] via SUBCUTANEOUS

## 2016-01-26 MED ORDER — HYDROMORPHONE HCL 1 MG/ML IJ SOLN
INTRAMUSCULAR | Status: AC
  Administered 2016-01-26: 0.5 mg via INTRAVENOUS
  Filled 2016-01-26: qty 1

## 2016-01-26 MED ORDER — ONDANSETRON HCL 4 MG/2ML IJ SOLN: 4.0000 mg | Freq: Four times a day (QID) | INTRAMUSCULAR | Status: DC | PRN

## 2016-01-26 MED ORDER — MONTELUKAST SODIUM 10 MG PO TABS
10.0000 mg | ORAL_TABLET | Freq: Every day | ORAL | Status: DC
  Administered 2016-01-26: 10 mg via ORAL
  Filled 2016-01-26: qty 1

## 2016-01-26 MED ORDER — HYDROCHLOROTHIAZIDE 12.5 MG PO CAPS: 12.5000 mg | ORAL_CAPSULE | Freq: Every day | ORAL | Status: DC

## 2016-01-26 MED ORDER — FENTANYL CITRATE (PF) 100 MCG/2ML IJ SOLN
INTRAMUSCULAR | Status: DC | PRN
  Administered 2016-01-26: 50 ug via INTRAVENOUS
  Administered 2016-01-26: 100 ug via INTRAVENOUS

## 2016-01-26 MED ORDER — PANTOPRAZOLE SODIUM 40 MG PO TBEC: 40.0000 mg | DELAYED_RELEASE_TABLET | Freq: Every day | ORAL | Status: DC

## 2016-01-26 MED ORDER — OXYCODONE-ACETAMINOPHEN 5-325 MG PO TABS
1.0000 | ORAL_TABLET | ORAL | Status: DC | PRN
  Administered 2016-01-26: 1 via ORAL
  Filled 2016-01-26: qty 1

## 2016-01-26 MED ORDER — LACTATED RINGERS IV SOLN
INTRAVENOUS | Status: DC | PRN
  Administered 2016-01-26 (×2): via INTRAVENOUS

## 2016-01-26 MED ORDER — LIDOCAINE HCL (CARDIAC) 20 MG/ML IV SOLN
INTRAVENOUS | Status: DC | PRN
  Administered 2016-01-26: 50 mg via INTRAVENOUS

## 2016-01-26 MED ORDER — CLONAZEPAM 0.5 MG PO TABS: 0.2500 mg | ORAL_TABLET | Freq: Three times a day (TID) | ORAL | Status: DC | PRN

## 2016-01-26 MED ORDER — ROCURONIUM BROMIDE 100 MG/10ML IV SOLN
INTRAVENOUS | Status: DC | PRN
  Administered 2016-01-26: 40 mg via INTRAVENOUS

## 2016-01-26 MED ORDER — FERROUS SULFATE 325 (65 FE) MG PO TABS
325.0000 mg | ORAL_TABLET | Freq: Two times a day (BID) | ORAL | Status: DC
  Administered 2016-01-26 – 2016-01-27 (×2): 325 mg via ORAL
  Filled 2016-01-26 (×2): qty 1

## 2016-01-26 MED ORDER — DEXAMETHASONE SODIUM PHOSPHATE 10 MG/ML IJ SOLN
INTRAMUSCULAR | Status: DC | PRN
  Administered 2016-01-26: 10 mg via INTRAVENOUS

## 2016-01-26 MED ORDER — METFORMIN HCL 850 MG PO TABS
850.0000 mg | ORAL_TABLET | Freq: Two times a day (BID) | ORAL | Status: DC
  Administered 2016-01-26 – 2016-01-27 (×2): 850 mg via ORAL
  Filled 2016-01-26 (×2): qty 1

## 2016-01-26 MED ORDER — ALBUTEROL SULFATE HFA 108 (90 BASE) MCG/ACT IN AERS
INHALATION_SPRAY | RESPIRATORY_TRACT | Status: DC | PRN
  Administered 2016-01-26: 2 via RESPIRATORY_TRACT

## 2016-01-26 MED ORDER — HYDROMORPHONE HCL 1 MG/ML IJ SOLN
INTRAMUSCULAR | Status: AC
  Filled 2016-01-26: qty 1

## 2016-01-26 MED ORDER — LISINOPRIL 20 MG PO TABS: 20.0000 mg | ORAL_TABLET | Freq: Every day | ORAL | Status: DC

## 2016-01-26 MED ORDER — PHENYLEPHRINE HCL 10 MG/ML IJ SOLN
INTRAMUSCULAR | Status: DC | PRN
  Administered 2016-01-26: 25 ug/min via INTRAVENOUS

## 2016-01-26 MED ORDER — ONDANSETRON HCL 4 MG/2ML IJ SOLN
INTRAMUSCULAR | Status: DC | PRN
  Administered 2016-01-26: 4 mg via INTRAVENOUS

## 2016-01-26 MED ORDER — OXYCODONE-ACETAMINOPHEN 5-325 MG PO TABS
ORAL_TABLET | ORAL | Status: AC
  Filled 2016-01-26: qty 1

## 2016-01-26 MED ORDER — 0.9 % SODIUM CHLORIDE (POUR BTL) OPTIME
TOPICAL | Status: DC | PRN
  Administered 2016-01-26: 1000 mL

## 2016-01-26 MED ORDER — BACITRACIN ZINC 500 UNIT/GM EX OINT
TOPICAL_OINTMENT | CUTANEOUS | Status: AC
  Filled 2016-01-26: qty 28.35

## 2016-01-26 MED ORDER — FENTANYL CITRATE (PF) 250 MCG/5ML IJ SOLN
INTRAMUSCULAR | Status: AC
  Filled 2016-01-26: qty 5

## 2016-01-26 MED ORDER — CITALOPRAM HYDROBROMIDE 20 MG PO TABS: 10.0000 mg | ORAL_TABLET | Freq: Every day | ORAL | Status: DC

## 2016-01-26 MED ORDER — INSULIN GLARGINE 100 UNIT/ML ~~LOC~~ SOLN
30.0000 [IU] | Freq: Every day | SUBCUTANEOUS | Status: DC
  Administered 2016-01-26: 30 [IU] via SUBCUTANEOUS
  Filled 2016-01-26 (×2): qty 0.3

## 2016-01-26 MED ORDER — ALBUTEROL SULFATE (2.5 MG/3ML) 0.083% IN NEBU: 2.5000 mg | INHALATION_SOLUTION | Freq: Four times a day (QID) | RESPIRATORY_TRACT | Status: DC | PRN

## 2016-01-26 MED ORDER — PHENYLEPHRINE HCL 10 MG/ML IJ SOLN
INTRAMUSCULAR | Status: DC | PRN
  Administered 2016-01-26 (×2): 80 ug via INTRAVENOUS

## 2016-01-26 MED ORDER — GABAPENTIN 300 MG PO CAPS
300.0000 mg | ORAL_CAPSULE | Freq: Two times a day (BID) | ORAL | Status: DC
  Administered 2016-01-26: 300 mg via ORAL
  Filled 2016-01-26: qty 1

## 2016-01-26 MED ORDER — ROPINIROLE HCL 0.5 MG PO TABS
0.2500 mg | ORAL_TABLET | Freq: Every evening | ORAL | Status: DC
  Administered 2016-01-26: 0.25 mg via ORAL
  Filled 2016-01-26: qty 1

## 2016-01-26 MED ORDER — MORPHINE SULFATE (PF) 2 MG/ML IV SOLN: 2.0000 mg | INTRAVENOUS | Status: DC | PRN

## 2016-01-26 MED ORDER — OXYMETAZOLINE HCL 0.05 % NA SOLN
NASAL | Status: DC | PRN
  Administered 2016-01-26: 1 via TOPICAL

## 2016-01-26 SURGICAL SUPPLY — 42 items
BLADE RAD40 ROTATE 4M 4 5PK (BLADE) IMPLANT
BLADE RAD40 ROTATE 4M 4MM 5PK (BLADE)
BLADE RAD60 ROTATE M4 4 5PK (BLADE) IMPLANT
BLADE RAD60 ROTATE M4 4MM 5PK (BLADE)
BLADE SURG 15 STRL LF DISP TIS (BLADE) IMPLANT
BLADE SURG 15 STRL SS (BLADE)
BLADE TRICUT ROTATE M4 4 5PK (BLADE) IMPLANT
BLADE TRICUT ROTATE M4 4MM 5PK (BLADE)
CANISTER SUCTION 2500CC (MISCELLANEOUS) ×3 IMPLANT
COAGULATOR SUCT 6 FR SWTCH (ELECTROSURGICAL) ×1
COAGULATOR SUCT SWTCH 10FR 6 (ELECTROSURGICAL) ×2 IMPLANT
COVER SURGICAL LIGHT HANDLE (MISCELLANEOUS) ×3 IMPLANT
DRAPE PROXIMA HALF (DRAPES) ×3 IMPLANT
ELECT REM PT RETURN 9FT ADLT (ELECTROSURGICAL) ×3
ELECTRODE REM PT RTRN 9FT ADLT (ELECTROSURGICAL) ×1 IMPLANT
GAUZE SPONGE 2X2 8PLY STRL LF (GAUZE/BANDAGES/DRESSINGS) IMPLANT
GAUZE SPONGE 4X4 12PLY STRL (GAUZE/BANDAGES/DRESSINGS) ×6 IMPLANT
GLOVE ECLIPSE 7.5 STRL STRAW (GLOVE) ×3 IMPLANT
GLOVE SURG SS PI 8.0 STRL IVOR (GLOVE) ×3 IMPLANT
GOWN STRL REUS W/ TWL LRG LVL3 (GOWN DISPOSABLE) ×1 IMPLANT
GOWN STRL REUS W/ TWL XL LVL3 (GOWN DISPOSABLE) ×1 IMPLANT
GOWN STRL REUS W/TWL LRG LVL3 (GOWN DISPOSABLE) ×2
GOWN STRL REUS W/TWL XL LVL3 (GOWN DISPOSABLE) ×2
KIT BASIN OR (CUSTOM PROCEDURE TRAY) ×3 IMPLANT
KIT ROOM TURNOVER OR (KITS) ×3 IMPLANT
NEEDLE HYPO 25GX1X1/2 BEV (NEEDLE) ×3 IMPLANT
NEEDLE SPNL 25GX3.5 QUINCKE BL (NEEDLE) ×3 IMPLANT
NS IRRIG 1000ML POUR BTL (IV SOLUTION) ×3 IMPLANT
PAD ARMBOARD 7.5X6 YLW CONV (MISCELLANEOUS) ×6 IMPLANT
SPLINT NASAL DOYLE BI-VL (GAUZE/BANDAGES/DRESSINGS) ×3 IMPLANT
SPONGE GAUZE 2X2 STER 10/PKG (GAUZE/BANDAGES/DRESSINGS)
SPONGE NEURO XRAY DETECT 1X3 (DISPOSABLE) ×3 IMPLANT
SUT CHROMIC 3 0 SH 27 (SUTURE) ×3 IMPLANT
SUT PLAIN 4 0 ~~LOC~~ 1 (SUTURE) ×3 IMPLANT
SUT PROLENE 2 0 FS (SUTURE) ×3 IMPLANT
SYR BULB IRRIGATION 50ML (SYRINGE) ×3 IMPLANT
SYR CONTROL 10ML LL (SYRINGE) ×3 IMPLANT
TOWEL OR 17X24 6PK STRL BLUE (TOWEL DISPOSABLE) ×3 IMPLANT
TRAY ENT MC OR (CUSTOM PROCEDURE TRAY) ×3 IMPLANT
TUBE SALEM SUMP 16 FR W/ARV (TUBING) IMPLANT
TUBING EXTENTION W/L.L. (IV SETS) IMPLANT
WATER STERILE IRR 1000ML POUR (IV SOLUTION) ×3 IMPLANT

## 2016-01-26 NOTE — Anesthesia Preprocedure Evaluation (Signed)
Anesthesia Evaluation  Patient identified by MRN, date of birth, ID band Patient awake    Reviewed: Allergy & Precautions, NPO status , Patient's Chart, lab work & pertinent test results  Airway Mallampati: II   Neck ROM: full    Dental   Pulmonary shortness of breath, COPD, Current Smoker,    breath sounds clear to auscultation       Cardiovascular hypertension,  Rhythm:regular Rate:Normal     Neuro/Psych PSYCHIATRIC DISORDERS Anxiety Depression TIA   GI/Hepatic GERD  ,  Endo/Other  diabetes, Type 2  Renal/GU      Musculoskeletal  (+) Arthritis ,   Abdominal   Peds  Hematology  (+) anemia ,   Anesthesia Other Findings   Reproductive/Obstetrics                             Anesthesia Physical Anesthesia Plan  ASA: III  Anesthesia Plan: General   Post-op Pain Management:    Induction: Intravenous  Airway Management Planned: Oral ETT  Additional Equipment:   Intra-op Plan:   Post-operative Plan: Extubation in OR  Informed Consent: I have reviewed the patients History and Physical, chart, labs and discussed the procedure including the risks, benefits and alternatives for the proposed anesthesia with the patient or authorized representative who has indicated his/her understanding and acceptance.     Plan Discussed with: CRNA, Anesthesiologist and Surgeon  Anesthesia Plan Comments:         Anesthesia Quick Evaluation

## 2016-01-26 NOTE — Op Note (Signed)
DATE OF PROCEDURE: 01/26/2016  OPERATIVE REPORT   SURGEON: Newman Pies, MD   PREOPERATIVE DIAGNOSES:  1. Severe nasal septal deviation.  2. Bilateral inferior turbinate hypertrophy.  3. Chronic nasal obstruction. 4. Chronic maxillary sinusitis.  POSTOPERATIVE DIAGNOSES:  1. Severe nasal septal deviation.  2. Bilateral inferior turbinate hypertrophy.  3. Chronic nasal obstruction. 4. Chronic maxillary sinusitis.  PROCEDURE PERFORMED:  1. Septoplasty.  2. Bilateral partial inferior turbinate resection. 3. Bilateral endoscopic maxillary antrostomy.   ANESTHESIA: General endotracheal tube anesthesia.   COMPLICATIONS: None.   ESTIMATED BLOOD LOSS: Less than 100 mL.   INDICATION FOR PROCEDURE: Victoria Lara is a 60 y.o. female with a history of chronic nasal obstruction and recurrent sinusitis. The patient was previously treated with multiple courses of antibiotics, antihistamine, decongestant, steroid nasal spray, and systemic steroids. However, the patient continues to be symptomatic. On examination, the patient was noted to have bilateral severe inferior turbinate hypertrophy and nasal septal deviation, causing significant nasal obstruction. Her CT scan also showed obstruction of the maxillary antrum and ostiomeatal complex bilaterally. Based on the above findings, the decision was made for the patient to undergo the above-stated procedures. The risks, benefits, alternatives, and details of the procedure were discussed with the patient. Questions were invited and answered. Informed consent was obtained.   DESCRIPTION OF PROCEDURE: The patient was taken to the operating room and placed supine on the operating table. General endotracheal tube anesthesia was administered by the anesthesiologist. The patient was positioned, and prepped and draped in the standard fashion for nasal surgery. Pledgets soaked with Afrin were placed in both nasal cavities for decongestion. The pledgets were  subsequently removed. The above mentioned severe septal deviation was again noted. 1% lidocaine with 1:100,000 epinephrine was injected onto the nasal septum bilaterally. A hemitransfixion incision was made on the left side. The mucosal flap was carefully elevated on the left side. A cartilaginous incision was made 1 cm superior to the caudal margin of the nasal septum. Mucosal flap was also elevated on the right side in the similar fashion. It should be noted that due to the severe septal deviation, the deviated portion of the cartilaginous and bony septum had to be removed in piecemeal fashion. Once the deviated portions were removed, a straight midline septum was achieved. The septum was then quilted with 4-0 plain gut sutures. The hemitransfixion incision was closed with interrupted 4-0 chromic sutures.   Attention was then focused on the maxillary sinuses. Using a 0 endoscope, the left middle turbinate was carefully medialized. The uncinate process was resected with a freer elevator. The left maxillary antrum was then entered and enlarged with a combination of Tru-Cut forceps and backbiter. Polypoid mucosa was removed. No suspicious mass or lesion is noted. The same procedure was repeated on the right side without exception.  Attention was then focused on the inferior turbinates. The inferior one half of both hypertrophied inferior turbinate was crossclamped with a Kelly clamp. The inferior one half of each inferior turbinate was then resected with a pair of cross cutting scissors. Hemostasis was achieved with a suction cautery device.   The care of the patient was turned over to the anesthesiologist. The patient was awakened from anesthesia without difficulty. The patient was extubated and transferred to the recovery room in good condition.   OPERATIVE FINDINGS: Nasal septal deviation and bilateral inferior turbinate hypertrophy. Chronic maxillary sinusitis. SPECIMEN: None.   FOLLOWUP CARE: The  patient be discharged home once she is awake and alert. The  patient will be placed on Percocet 1  tablet p.o. q.4 hours p.r.n. pain, and amoxicillin 875 mg p.o. b.i.d. for 5 days. The patient will follow up in my office in approximately 1 week for splint removal.   Birt Reinoso [name], MD

## 2016-01-26 NOTE — H&P (Signed)
Cc: Recurrent rhinosinusitis, chronic nasal obstruction  HPI: The patient is a 60 year old female who returns today for her follow-up evaluation.  She was last seen 3 weeks ago.  At that time, she was noted to have significant nasal obstruction, secondary to her nasal septal deviation and bilateral inferior turbinate hypertrophy. The patient also has a history of frequent recurrent sinusitis. She underwent a paranasal sinus CT scan. The CT showed significant mucosal edema, obstruction to bilateral ostiomeatal complex.  No acute or chronic sinusitis was noted on a CT scan. The patient was treated with Zyrtec, Singulair and Flonase nasal spray.  According to the patient, she continues to be symptomatic. She reports significant nasal obstruction bilaterally.  Currently she denies any facial pain, fever, or visual change.  Exam: The nasal cavities were decongested and anesthetised with a combination of oxymetazoline and 4% lidocaine solution.  The flexible scope was inserted into the right nasal cavity.  Endoscopy of the inferior and middle meatus was performed.  The edematous mucosa was again noted.  No polyp, mass, or lesion was appreciated. Significant NSD noted. Olfactory cleft was clear.  Nasopharynx was clear.  Turbinates were hypertrophied but without mass.  Incomplete response to decongestion.  The procedure was repeated on the contralateral side with similar findings.  The patient tolerated the procedure well.  Instructions were given to avoid eating or drinking for 2 hours.   Assessment: 1.  Chronic nasal obstruction, secondary to nasal mucosal congestion, nasal septal deviation and bilateral inferior turbinate hypertrophy. More than 95% of her nasal passageways are obstructed bilaterally.  2.  Mucosal edema, causing significant narrowing of her ostiomeatal complex bilaterally.    Plan: 1.  The nasal endoscopy findings and the CT images are reviewed with the patient.  2.  The treatment options  are extensively discussed.  The options include continuing conservative observation with medical treatment versus surgical intervention with maxillary antrostomy, septoplasty, and turbinate reduction.  The pros and cons of the treatment options are extensively discussed.  The risks, benefits, alternatives and details of the procedure are also reviewed.  3.  The patient would like to proceed with surgical intervention.  We will schedule the procedure in accordance with the family's schedule.

## 2016-01-26 NOTE — Transfer of Care (Signed)
Immediate Anesthesia Transfer of Care Note  Patient: Victoria Lara  Procedure(s) Performed: Procedure(s): NASAL SEPTOPLASTY WITH BILATERAL TURBINATE REDUCTION (Bilateral) BILATERAL ENDOSCOPIC MAXILLARY ANTROSTOMY (Bilateral)  Patient Location: PACU  Anesthesia Type:General  Level of Consciousness: awake and alert   Airway & Oxygen Therapy: Patient Spontanous Breathing and Patient connected to nasal cannula oxygen  Post-op Assessment: Report given to RN and Post -op Vital signs reviewed and stable  Post vital signs: Reviewed and stable  Last Vitals:  Vitals:   01/26/16 0824  BP: (!) 165/92  Pulse: 90  Resp: 20  Temp: 37.1 C    Last Pain:  Vitals:   01/26/16 0824  TempSrc: Oral         Complications: No apparent anesthesia complications

## 2016-01-26 NOTE — Discharge Instructions (Signed)

## 2016-01-26 NOTE — Anesthesia Procedure Notes (Signed)
Procedure Name: Intubation Date/Time: 01/26/2016 9:45 AM Performed by: Gwenyth Allegra Pre-anesthesia Checklist: Patient identified, Emergency Drugs available, Patient being monitored and Timeout performed Patient Re-evaluated:Patient Re-evaluated prior to inductionOxygen Delivery Method: Circle system utilized Preoxygenation: Pre-oxygenation with 100% oxygen Ventilation: Mask ventilation without difficulty and Oral airway inserted - appropriate to patient size Laryngoscope Size: Mac and 3 Grade View: Grade II Tube type: Oral Tube size: 7.0 mm Number of attempts: 1 Airway Equipment and Method: Stylet Placement Confirmation: ETT inserted through vocal cords under direct vision,  positive ETCO2 and breath sounds checked- equal and bilateral Secured at: 21 cm Tube secured with: Tape Dental Injury: Teeth and Oropharynx as per pre-operative assessment

## 2016-01-27 ENCOUNTER — Encounter (HOSPITAL_COMMUNITY): Payer: Self-pay | Admitting: Otolaryngology

## 2016-01-27 ENCOUNTER — Telehealth: Payer: Self-pay | Admitting: Neurology

## 2016-01-27 DIAGNOSIS — J3489 Other specified disorders of nose and nasal sinuses: Secondary | ICD-10-CM | POA: Diagnosis not present

## 2016-01-27 DIAGNOSIS — K219 Gastro-esophageal reflux disease without esophagitis: Secondary | ICD-10-CM | POA: Diagnosis not present

## 2016-01-27 DIAGNOSIS — Z8673 Personal history of transient ischemic attack (TIA), and cerebral infarction without residual deficits: Secondary | ICD-10-CM | POA: Diagnosis not present

## 2016-01-27 DIAGNOSIS — J343 Hypertrophy of nasal turbinates: Secondary | ICD-10-CM | POA: Diagnosis not present

## 2016-01-27 DIAGNOSIS — E119 Type 2 diabetes mellitus without complications: Secondary | ICD-10-CM | POA: Diagnosis not present

## 2016-01-27 DIAGNOSIS — I10 Essential (primary) hypertension: Secondary | ICD-10-CM | POA: Diagnosis not present

## 2016-01-27 DIAGNOSIS — J449 Chronic obstructive pulmonary disease, unspecified: Secondary | ICD-10-CM | POA: Diagnosis not present

## 2016-01-27 DIAGNOSIS — J320 Chronic maxillary sinusitis: Secondary | ICD-10-CM | POA: Diagnosis not present

## 2016-01-27 DIAGNOSIS — Z794 Long term (current) use of insulin: Secondary | ICD-10-CM | POA: Diagnosis not present

## 2016-01-27 DIAGNOSIS — Z7982 Long term (current) use of aspirin: Secondary | ICD-10-CM | POA: Diagnosis not present

## 2016-01-27 DIAGNOSIS — J342 Deviated nasal septum: Secondary | ICD-10-CM | POA: Diagnosis not present

## 2016-01-27 LAB — GLUCOSE, CAPILLARY: Glucose-Capillary: 176 mg/dL — ABNORMAL HIGH (ref 65–99)

## 2016-01-27 NOTE — Discharge Summary (Signed)
Physician Discharge Summary  Patient ID: Victoria Lara MRN: [id number] DOB/AGE: July 05, 1955 60 y.o.  Admit date: 01/26/2016 Discharge date: 01/27/2016  Admission Diagnoses: Chronic nasal obstruction, chronic sinusitis  Discharge Diagnoses: Chronic nasal obstruction, chronic sinusitis Active Problems:   S/P nasal septoplasty   Discharged Condition: good  Hospital Course: Pt had an uneventful overnight stay. No stridor. No significant desat.  Consults: None  Significant Diagnostic Studies: None  Treatments: surgery: septoplasty, turbinate reduction  Discharge Exam: Blood pressure (!) 152/76, pulse 89, temperature 98 F (36.7 C), temperature source Oral, resp. rate 17, height 5\' 1"  (1.549 m), weight 186 lb (84.4 kg), SpO2 97 %.    Disposition: 01-Home or Self Care  Discharge Instructions    Activity as tolerated - No restrictions    Complete by:  As directed   Diet general    Complete by:  As directed       Medication List    STOP taking these medications   aspirin EC 325 MG tablet   fluticasone 50 MCG/ACT nasal spray Commonly known as:  FLONASE     TAKE these medications   ACCU-CHEK AVIVA device Use as instructed   acetaminophen 500 MG tablet Commonly known as:  TYLENOL Take 1,000 mg by mouth every 6 (six) hours as needed for mild pain.   albuterol (2.5 MG/3ML) 0.083% nebulizer solution Commonly known as:  PROVENTIL Take 3 mLs (2.5 mg total) by nebulization every 6 (six) hours as needed for wheezing.   PROAIR HFA 108 (90 Base) MCG/ACT inhaler Generic drug:  albuterol Inhale 2 puffs into the lungs every 4 (four) hours as needed for wheezing or shortness of breath.   amitriptyline 25 MG tablet Commonly known as:  ELAVIL Take 25 mg by mouth at bedtime.   amoxicillin 875 MG tablet Commonly known as:  AMOXIL Take 1 tablet (875 mg total) by mouth 2 (two) times daily.   atorvastatin 20 MG tablet Commonly known as:  LIPITOR Take 1 tablet (20 mg total)  by mouth daily.   buPROPion 150 MG 12 hr tablet Commonly known as:  WELLBUTRIN SR Take 150 mg by mouth 2 (two) times daily.   cetirizine 10 MG tablet Commonly known as:  ZYRTEC Take 10 mg by mouth daily.   citalopram 10 MG tablet Commonly known as:  CELEXA Take 10 mg by mouth daily.   clonazePAM 0.5 MG tablet Commonly known as:  KLONOPIN Take 0.25-0.5 mg by mouth every 8 (eight) hours as needed for anxiety. for anxiety   ferrous sulfate 325 (65 FE) MG tablet Take 325 mg by mouth 2 (two) times daily with a meal.   gabapentin 300 MG capsule Commonly known as:  NEURONTIN Take 300 mg by mouth 2 (two) times daily.   glucose blood test strip Commonly known as:  ACCU-CHEK AVIVA To test 4 times a day   hydrochlorothiazide 12.5 MG capsule Commonly known as:  MICROZIDE Take 12.5 mg by mouth daily.   Insulin Glargine 300 UNIT/ML Sopn Inject 30 Units into the skin at bedtime.   lisinopril 20 MG tablet Commonly known as:  PRINIVIL,ZESTRIL Take 20 mg by mouth daily.   meloxicam 15 MG tablet Commonly known as:  MOBIC Take 15 mg by mouth daily.   metFORMIN 850 MG tablet Commonly known as:  GLUCOPHAGE Take 850 mg by mouth 2 (two) times daily with a meal.   montelukast 10 MG tablet Commonly known as:  SINGULAIR Take 10 mg by mouth at bedtime.   omeprazole 20  MG capsule Commonly known as:  PRILOSEC Take 20 mg by mouth daily.   oxyCODONE-acetaminophen 5-325 MG tablet Commonly known as:  PERCOCET/ROXICET Take 1 tablet by mouth every 4 (four) hours as needed for severe pain.   rOPINIRole 0.25 MG tablet Commonly known as:  REQUIP Take 0.25 mg by mouth every evening.      Follow-up Information    Darletta Moll, MD Follow up on 01/31/2016.   Specialty:  Otolaryngology Why:  As scheduled Contact information: [address] [place] [address] [phone]           Signed: Darletta Moll 01/27/2016, 6:38 AM

## 2016-01-27 NOTE — Telephone Encounter (Signed)
Per other phone note "Pt's daughter in law returned. She is requesting calling her back later this even. Thank you"

## 2016-01-27 NOTE — Telephone Encounter (Signed)
Dr Lucia Gaskins- [place] Childes Called daughter in law back. R.s f/u from 8/31 to 9/5 per request. She stated pt did not have any episodes while being monitored for the 3 day EEG. She thinks the push button event was a mistake.

## 2016-01-27 NOTE — Telephone Encounter (Signed)
Pt's daughter in law returned. She is requesting calling her back later this even. Thank you

## 2016-01-27 NOTE — Progress Notes (Signed)
Pt Discharged. Discharge paper reviewed , pt understands. Supplies ( gauzes) sent home with pt. Family with her at departure.

## 2016-01-27 NOTE — Anesthesia Postprocedure Evaluation (Signed)
Anesthesia Post Note  Patient: Victoria Lara  Procedure(s) Performed: Procedure(s) (LRB): NASAL SEPTOPLASTY WITH BILATERAL TURBINATE REDUCTION (Bilateral) BILATERAL ENDOSCOPIC MAXILLARY ANTROSTOMY (Bilateral)  Patient location during evaluation: PACU Anesthesia Type: General Level of consciousness: awake and alert and patient cooperative Pain management: pain level controlled Vital Signs Assessment: post-procedure vital signs reviewed and stable Respiratory status: spontaneous breathing and respiratory function stable Cardiovascular status: stable Anesthetic complications: no    Last Vitals:  Vitals:   01/27/16 0230 01/27/16 0629  BP: 135/63 (!) 152/76  Pulse: 89 89  Resp: 15 17  Temp: 36.3 C 36.7 C    Last Pain:  Vitals:   01/27/16 0854  TempSrc:   PainSc: 3                  Senia Even S

## 2016-01-27 NOTE — Care Management Note (Signed)
Case Management Note  Patient Details  Name: Victoria Lara MRN: [id number] Date of Birth: 1956-05-24  Subjective/Objective:     Patient is from home, for discharge today, no needs.               Action/Plan:   Expected Discharge Date:                  Expected Discharge Plan:  Home/Self Care  In-House Referral:     Discharge planning Services  CM Consult  Post Acute Care Choice:    Choice offered to:     DME Arranged:    DME Agency:     HH Arranged:    HH Agency:     Status of Service:  Completed, signed off  If discussed at [employer] of Stay Meetings, dates discussed:    Additional Comments:  Leone Haven, RN 01/27/2016, 11:38 AM

## 2016-01-28 ENCOUNTER — Ambulatory Visit (INDEPENDENT_AMBULATORY_CARE_PROVIDER_SITE_OTHER): Payer: Medicare Other | Admitting: "Endocrinology

## 2016-01-28 ENCOUNTER — Encounter: Payer: Self-pay | Admitting: "Endocrinology

## 2016-01-28 VITALS — BP 122/66 | HR 98 | Resp 18 | Ht 61.0 in | Wt 185.0 lb

## 2016-01-28 DIAGNOSIS — E669 Obesity, unspecified: Secondary | ICD-10-CM

## 2016-01-28 DIAGNOSIS — E785 Hyperlipidemia, unspecified: Secondary | ICD-10-CM | POA: Diagnosis not present

## 2016-01-28 DIAGNOSIS — Z720 Tobacco use: Secondary | ICD-10-CM | POA: Diagnosis not present

## 2016-01-28 DIAGNOSIS — E119 Type 2 diabetes mellitus without complications: Secondary | ICD-10-CM | POA: Diagnosis not present

## 2016-01-28 DIAGNOSIS — I10 Essential (primary) hypertension: Secondary | ICD-10-CM | POA: Diagnosis not present

## 2016-01-28 DIAGNOSIS — E1169 Type 2 diabetes mellitus with other specified complication: Secondary | ICD-10-CM

## 2016-01-28 DIAGNOSIS — F17200 Nicotine dependence, unspecified, uncomplicated: Secondary | ICD-10-CM

## 2016-01-28 MED ORDER — INSULIN GLARGINE 300 UNIT/ML ~~LOC~~ SOPN: 30.0000 [IU] | PEN_INJECTOR | Freq: Every day | SUBCUTANEOUS | 2 refills | Status: DC

## 2016-01-28 MED ORDER — INSULIN PEN NEEDLE 31G X 8 MM MISC: 1.0000 | 3 refills | Status: DC

## 2016-01-28 NOTE — Patient Instructions (Signed)

## 2016-01-28 NOTE — Progress Notes (Signed)
Subjective:    Patient ID: Victoria Lara, female    DOB: January 28, 1956. Patient is being seen in f/u for management of diabetes requested by  Juliette Alcide, MD  Past Medical History:  Diagnosis Date  . Anemia   . Anxiety   . Arthritis   . Bronchitis   . Complication of anesthesia    had to come back to ER after shoulder surgery at [facility]; hard to wake up; low O2 sats  . Depression   . Diabetes mellitus without complication (HCC)   . GERD (gastroesophageal reflux disease)   . Hypertension   . Neuropathy (HCC)    Takes Gabapentin  . Pneumonia 2016  . Restless leg syndrome   . Seasonal allergies   . Shortness of breath dyspnea   . Syncope   . TIA (transient ischemic attack) 2006   Past Surgical History:  Procedure Laterality Date  . BREAST LUMPECTOMY Bilateral   . CATARACT EXTRACTION W/PHACO Right 08/20/2014   Procedure: CATARACT EXTRACTION PHACO AND INTRAOCULAR LENS PLACEMENT (IOC);  Surgeon: Gemma Payor, MD;  Location: [facility];  Service: Ophthalmology;  Laterality: Right;  CDE:5.31  . HAND SURGERY Bilateral   . NASAL SEPTOPLASTY W/ TURBINOPLASTY Bilateral 01/26/2016   Procedure: NASAL SEPTOPLASTY WITH BILATERAL TURBINATE REDUCTION;  Surgeon: Newman Pies, MD;  Location: [facility];  Service: ENT;  Laterality: Bilateral;  . NASAL SEPTUM SURGERY  01/26/2016  . NASAL SINUS SURGERY Bilateral 01/26/2016   Procedure: BILATERAL ENDOSCOPIC MAXILLARY ANTROSTOMY;  Surgeon: Newman Pies, MD;  Location: [facility] OR;  Service: ENT;  Laterality: Bilateral;  . SHOULDER ARTHROSCOPY WITH ROTATOR CUFF REPAIR Right   . TUBAL LIGATION     Social History   Social History  . Marital status: Widowed    Spouse name: N/A  . Number of children: 1  . Years of education: 9   Occupational History  . Unemployed    Social History Main Topics  . Smoking status: Current Every Day Smoker    Packs/day: 0.50    Years: 41.00    Types: Cigarettes  . Smokeless tobacco: Never Used  . Alcohol use No  . Drug use: No   . Sexual activity: Not Asked   Other Topics Concern  . None   Social History Narrative   Lives with boyfriend   Caffeine use: sometimes per pt   Outpatient Encounter Prescriptions as of 01/28/2016  Medication Sig  . acetaminophen (TYLENOL) 500 MG tablet Take 1,000 mg by mouth every 6 (six) hours as needed for mild pain.  Marland Kitchen albuterol (PROVENTIL) (2.5 MG/3ML) 0.083% nebulizer solution Take 3 mLs (2.5 mg total) by nebulization every 6 (six) hours as needed for wheezing.  Marland Kitchen amitriptyline (ELAVIL) 25 MG tablet Take 25 mg by mouth at bedtime.  Marland Kitchen amoxicillin (AMOXIL) 875 MG tablet Take 1 tablet (875 mg total) by mouth 2 (two) times daily.  Marland Kitchen atorvastatin (LIPITOR) 20 MG tablet Take 1 tablet (20 mg total) by mouth daily.  . Blood Glucose Monitoring Suppl (ACCU-CHEK AVIVA) device Use as instructed  . buPROPion (WELLBUTRIN SR) 150 MG 12 hr tablet Take 150 mg by mouth 2 (two) times daily.  . cetirizine (ZYRTEC) 10 MG tablet Take 10 mg by mouth daily.  . citalopram (CELEXA) 10 MG tablet Take 10 mg by mouth daily.  . clonazePAM (KLONOPIN) 0.5 MG tablet Take 0.25-0.5 mg by mouth every 8 (eight) hours as needed for anxiety. for anxiety  . ferrous sulfate 325 (65 FE) MG tablet Take 325  mg by mouth 2 (two) times daily with a meal.  . gabapentin (NEURONTIN) 300 MG capsule Take 300 mg by mouth 2 (two) times daily.  Marland Kitchen glucose blood (ACCU-CHEK AVIVA) test strip To test 4 times a day  . hydrochlorothiazide (MICROZIDE) 12.5 MG capsule Take 12.5 mg by mouth daily.  . Insulin Glargine 300 UNIT/ML SOPN Inject 30 Units into the skin at bedtime.  Marland Kitchen lisinopril (PRINIVIL,ZESTRIL) 20 MG tablet Take 20 mg by mouth daily.  . meloxicam (MOBIC) 15 MG tablet Take 15 mg by mouth daily.  . metFORMIN (GLUCOPHAGE) 850 MG tablet Take 850 mg by mouth 2 (two) times daily with a meal.  . montelukast (SINGULAIR) 10 MG tablet Take 10 mg by mouth at bedtime.  Marland Kitchen omeprazole (PRILOSEC) 20 MG capsule Take 20 mg by mouth daily.  Marland Kitchen  oxyCODONE-acetaminophen (PERCOCET/ROXICET) 5-325 MG tablet Take 1 tablet by mouth every 4 (four) hours as needed for severe pain.  Marland Kitchen PROAIR HFA 108 (90 Base) MCG/ACT inhaler Inhale 2 puffs into the lungs every 4 (four) hours as needed for wheezing or shortness of breath.   Marland Kitchen rOPINIRole (REQUIP) 0.25 MG tablet Take 0.25 mg by mouth every evening.   . [DISCONTINUED] Insulin Glargine 300 UNIT/ML SOPN Inject 30 Units into the skin at bedtime.  . Insulin Pen Needle (B-D ULTRAFINE III SHORT PEN) 31G X 8 MM MISC 1 each by Does not apply route as directed.   No facility-administered encounter medications on file as of 01/28/2016.    ALLERGIES: No Known Allergies VACCINATION STATUS:  There is no immunization history on file for this patient.  Diabetes  She presents for her follow-up diabetic visit. She has type 2 diabetes mellitus. Onset time: She was diagnosed at approximate age of 44 years. Her disease course has been improving. There are no hypoglycemic associated symptoms. Pertinent negatives for hypoglycemia include no confusion, headaches, pallor or seizures. Associated symptoms include blurred vision, fatigue, polydipsia and polyuria. Pertinent negatives for diabetes include no chest pain and no polyphagia. There are no hypoglycemic complications. Symptoms are improving. Diabetic complications include a CVA. (She is a chronic heavy smoker.) Risk factors for coronary artery disease include diabetes mellitus, dyslipidemia, hypertension, obesity, sedentary lifestyle and tobacco exposure. Current diabetic treatment includes oral agent (monotherapy). Her weight is decreasing steadily. She is following a generally unhealthy diet. When asked about meal planning, she reported none. She rarely participates in exercise. Her breakfast blood glucose range is generally 180-200 mg/dl. Her lunch blood glucose range is generally 180-200 mg/dl. Her dinner blood glucose range is generally 180-200 mg/dl. Her overall blood  glucose range is 180-200 mg/dl. An ACE inhibitor/angiotensin II receptor blocker is being taken. Eye exam is current.  Hyperlipidemia  This is a new diagnosis. The problem is uncontrolled. Recent lipid tests were reviewed and are high. Exacerbating diseases include diabetes and obesity. Pertinent negatives include no chest pain, myalgias or shortness of breath. She is currently on no antihyperlipidemic treatment. Risk factors for coronary artery disease include diabetes mellitus, dyslipidemia, hypertension and a sedentary lifestyle.  Hypertension  This is a chronic problem. The current episode started more than 1 year ago. Associated symptoms include blurred vision. Pertinent negatives include no chest pain, headaches, palpitations or shortness of breath. Risk factors for coronary artery disease include diabetes mellitus, dyslipidemia, sedentary lifestyle and smoking/tobacco exposure. Past treatments include ACE inhibitors. Hypertensive end-organ damage includes CVA.       Review of Systems  Constitutional: Positive for fatigue. Negative for activity change, chills,  fever and unexpected weight change.  HENT: Negative for trouble swallowing and voice change.   Eyes: Positive for blurred vision. Negative for visual disturbance.  Respiratory: Negative for cough, shortness of breath and wheezing.   Cardiovascular: Negative for chest pain, palpitations and leg swelling.  Gastrointestinal: Negative for diarrhea, nausea and vomiting.  Endocrine: Positive for polydipsia and polyuria. Negative for cold intolerance, heat intolerance and polyphagia.  Genitourinary: Positive for frequency. Negative for dysuria and flank pain.  Musculoskeletal: Negative for arthralgias and myalgias.  Skin: Negative for color change, pallor, rash and wound.  Neurological: Negative for seizures and headaches.  Psychiatric/Behavioral: Negative for confusion, dysphoric mood and suicidal ideas.  All other systems reviewed and  are negative.   Objective:    BP 122/66   Pulse 98   Resp 18   Ht  (1.549 m)   Wt 185 lb (83.9 kg)   SpO2 92%   BMI 34.96 kg/m   Wt Readings from Last 3 Encounters:  01/28/16 185 lb (83.9 kg)  01/26/16 186 lb (84.4 kg)  01/20/16 188 lb (85.3 kg)    Physical Exam  Constitutional: She is oriented to person, place, and time. She appears well-developed.  HENT:  Head: Normocephalic and atraumatic.  Eyes: EOM are normal.  Neck: Normal range of motion. Neck supple. No tracheal deviation present. No thyromegaly present.  Cardiovascular: Normal rate and regular rhythm.   Pulmonary/Chest: Effort normal and breath sounds normal.  Abdominal: Soft. Bowel sounds are normal. There is no tenderness. There is no guarding.  Musculoskeletal: Normal range of motion. She exhibits no edema.  Neurological: She is alert and oriented to person, place, and time. She has normal reflexes. No cranial nerve deficit. Coordination normal.  Skin: Skin is warm and dry. No rash noted. No erythema. No pallor.  Psychiatric:  Hesitant affect.     CMP ( most recent) CMP     Component Value Date/Time   NA 135 01/18/2016 1630   NA 140 11/24/2015 1624   K 4.7 01/18/2016 1630   CL 98 (L) 01/18/2016 1630   CO2 27 01/18/2016 1630   GLUCOSE 245 (H) 01/18/2016 1630   BUN 10 01/18/2016 1630   BUN 24 11/24/2015 1624   CREATININE 0.63 01/18/2016 1630   CALCIUM 9.6 01/18/2016 1630   PROT 6.6 11/24/2015 1624   ALBUMIN 4.2 11/24/2015 1624   AST 25 11/24/2015 1624   ALT 24 11/24/2015 1624   ALKPHOS 79 11/24/2015 1624   BILITOT 0.4 11/24/2015 1624   GFRNONAA >60 01/18/2016 1630   GFRAA >60 01/18/2016 1630     Diabetic Labs (most recent): Lab Results  Component Value Date   HGBA1C 9.3 (H) 01/18/2016   HGBA1C 10.2 11/16/2015   Lipid Panel     Component Value Date/Time   CHOL 174 11/16/2015   TRIG 213 (A) 11/16/2015   HDL 32 (A) 11/16/2015   LDLCALC 99 11/16/2015       Assessment & Plan:    1. Uncontrolled type 2 diabetes mellitus with other circulatory complication, without long-term current use of insulin (HCC)   - Patient has currently uncontrolled symptomatic type 2 DM since  60 years of age,  with most recent A1c of 9.3 %. Recent labs reviewed.   Her diabetes is complicated by TIA, chronic heavy smoking and patient remains at a high risk for more acute and chronic complications of diabetes which include CAD, CVA, CKD, retinopathy, and neuropathy. These are all discussed in detail with the patient.  -  I have counseled the patient on diet management and weight loss, by adopting a carbohydrate restricted/protein rich diet.  - Suggestion is made for patient to avoid simple carbohydrates   from their diet including Cakes , Desserts, Ice Cream,  Soda (  diet and regular) , Sweet Tea , Candies,  Chips, Cookies, Artificial Sweeteners,   and "Sugar-free" Products . This will help patient to have stable blood glucose profile and potentially avoid unintended weight gain.  - I encouraged the patient to switch to  unprocessed or minimally processed complex starch and increased protein intake (animal or plant source), fruits, and vegetables.  - Patient is advised to stick to a routine mealtimes to eat 3 meals  a day and avoid unnecessary snacks ( to snack only to correct hypoglycemia).  - The patient will be scheduled with Norm Salt, RDN, CDE for individualized DM education.  - I have approached patient with the following individualized plan to manage diabetes and patient agrees:   - She came with improved blood glucose profile and did have her sinus surgery on last Tuesday.  - I  will continue with basal insulin associated with monitoring of blood glucose before breakfast and at bedtime.  -Patient is encouraged to call clinic for blood glucose levels less than 70 or above 200 mg /dl. - I  will prescribe Toujeo 30 units daily at bedtime. - I will continue metformin 850 mg by mouth  twice a day, therapeutically suitable for patient.  - Patient will be considered for incretin therapy as appropriate next visit. - Patient specific target  A1c;  LDL, HDL, Triglycerides, and  Waist Circumference were discussed in detail.  2) BP/HTN: Controlled. Continue current medications including ACEI/ARB. 3) Lipids/HPL:  Uncontrolled, triglycerides 213, I have added atorvastatin 20 mg by mouth daily at bedtime.  4)  Weight/Diet: CDE Consult  has been initiated , exercise, and detailed carbohydrates information provided.  5) Chronic Care/Health Maintenance:  -Patient is on ACEI and Statin medications and encouraged to continue to follow up with Ophthalmology, Podiatrist at least yearly or according to recommendations, and advised to  quit smoking. I have recommended yearly flu vaccine and pneumonia vaccination at least every 5 years; moderate intensity exercise for up to 150 minutes weekly; and  sleep for at least 7 hours a day.  - 25 minutes of time was spent on the care of this patient , 50% of which was applied for counseling on diabetes complications and their preventions.  - Patient to bring meter and  blood glucose logs during their next visit.   - I advised patient to maintain close follow up with Juliette Alcide, MD for primary care needs.  Follow up plan: - Return in about 8 weeks (around 03/24/2016).  Marquis Lunch, MD Phone: [phone]  Fax: [phone]   01/28/2016, 3:52 PM

## 2016-01-31 ENCOUNTER — Ambulatory Visit (INDEPENDENT_AMBULATORY_CARE_PROVIDER_SITE_OTHER): Payer: Medicare Other | Admitting: Otolaryngology

## 2016-01-31 ENCOUNTER — Encounter: Payer: Self-pay | Admitting: "Endocrinology

## 2016-01-31 DIAGNOSIS — J320 Chronic maxillary sinusitis: Secondary | ICD-10-CM

## 2016-02-02 ENCOUNTER — Telehealth: Payer: Self-pay | Admitting: Neurology

## 2016-02-02 NOTE — Telephone Encounter (Signed)
Faxed Dr Trevor MaceAhern's recent OV note, plus EEG results from 12/30/15 as requested. Received confirmation.

## 2016-02-02 NOTE — Telephone Encounter (Signed)
[facility] [phone] called to request last visit notes and if patient has had routine EEG, states patient's insurance has denied Ambulatory Video EEG. Please fax to [phone].

## 2016-02-02 NOTE — Telephone Encounter (Signed)
I called patient back and left a VM. If the patient or her daughter in law calls back please ask what this regarding. I do not see any current orders for MRI's for this patient nor a submission for botox.

## 2016-02-08 DIAGNOSIS — J449 Chronic obstructive pulmonary disease, unspecified: Secondary | ICD-10-CM | POA: Diagnosis not present

## 2016-02-14 ENCOUNTER — Ambulatory Visit (INDEPENDENT_AMBULATORY_CARE_PROVIDER_SITE_OTHER): Payer: Self-pay | Admitting: Otolaryngology

## 2016-02-15 ENCOUNTER — Encounter: Payer: Self-pay | Admitting: Neurology

## 2016-02-15 ENCOUNTER — Ambulatory Visit (INDEPENDENT_AMBULATORY_CARE_PROVIDER_SITE_OTHER): Payer: Medicare Other | Admitting: Neurology

## 2016-02-15 VITALS — BP 144/80 | HR 78 | Resp 18 | Ht 61.0 in | Wt 186.0 lb

## 2016-02-15 DIAGNOSIS — G2581 Restless legs syndrome: Secondary | ICD-10-CM

## 2016-02-15 DIAGNOSIS — R0683 Snoring: Secondary | ICD-10-CM | POA: Diagnosis not present

## 2016-02-15 DIAGNOSIS — F17200 Nicotine dependence, unspecified, uncomplicated: Secondary | ICD-10-CM

## 2016-02-15 DIAGNOSIS — E669 Obesity, unspecified: Secondary | ICD-10-CM | POA: Diagnosis not present

## 2016-02-15 DIAGNOSIS — G4750 Parasomnia, unspecified: Secondary | ICD-10-CM

## 2016-02-15 DIAGNOSIS — G4710 Hypersomnia, unspecified: Secondary | ICD-10-CM

## 2016-02-15 DIAGNOSIS — R0681 Apnea, not elsewhere classified: Secondary | ICD-10-CM

## 2016-02-15 DIAGNOSIS — Z720 Tobacco use: Secondary | ICD-10-CM

## 2016-02-15 NOTE — Patient Instructions (Signed)

## 2016-02-15 NOTE — Progress Notes (Signed)
Subjective:    Patient ID: Victoria Lara is a 60 y.o. female.  HPI     Huston Foley, MD, PhD [facility] [address], [address] [address] [place], [place] [address]  Dear Victoria Lara,   I saw your patient, Victoria Lara, upon your kind request in my clinic today for initial consultation of her sleep disorder, in particular, concern for underlying obstructive sleep apnea. The patient is accompanied by her daughter in law today. As you know, [name] is a 60 year old right-handed woman with an underlying medical history of type 2 diabetes, hypertension, depression, seasonal allergies, syncopal spells, smoking, and obesity, who reports snoring and excessive daytime somnolence and pauses in her breathing while asleep. I reviewed your office note from 11/24/2015. Of note, she recently had nasal septoplasty and bilateral turbinate reduction surgery on 01/26/2016, under Dr Suszanne Conners. Snoring has improved since her nose surgery. She has residual snoring. Her Epworth sleepiness score is 9 out of 24 today, her fatigue score is 19/63. She has a longstanding history of parasomnias, including clicking noises, sleep talking and even some dream enactments.  She is widowed, has one son, lives with her BF. She has reduced smoking, but still smokes about 1/2 ppd.    She does not drink EtOH, no daily caffeine. She does not keep a sleep schedule for her bedtime or her wake time. She watches TV in bed. She is on ropinirole for restless legs which has helped. She does feel tired during the day and not often fully rested, denies morning headaches. She is not sure if she kicks in her sleep.  Her Past Medical History Is Significant For: Past Medical History:  Diagnosis Date  . Anemia   . Anxiety   . Arthritis   . Bronchitis   . Complication of anesthesia    had to come back to ER after shoulder surgery at [facility]; hard to wake up; low O2 sats  . Depression   . Diabetes mellitus  without complication (HCC)   . GERD (gastroesophageal reflux disease)   . Hypertension   . Neuropathy (HCC)    Takes Gabapentin  . Pneumonia 2016  . Restless leg syndrome   . Seasonal allergies   . Shortness of breath dyspnea   . Syncope   . TIA (transient ischemic attack) 2006    Her Past Surgical History Is Significant For: Past Surgical History:  Procedure Laterality Date  . BREAST LUMPECTOMY Bilateral   . CATARACT EXTRACTION W/PHACO Right 08/20/2014   Procedure: CATARACT EXTRACTION PHACO AND INTRAOCULAR LENS PLACEMENT (IOC);  Surgeon: Gemma Payor, MD;  Location: [facility];  Service: Ophthalmology;  Laterality: Right;  CDE:5.31  . HAND SURGERY Bilateral   . NASAL SEPTOPLASTY W/ TURBINOPLASTY Bilateral 01/26/2016   Procedure: NASAL SEPTOPLASTY WITH BILATERAL TURBINATE REDUCTION;  Surgeon: Newman Pies, MD;  Location: [facility] OR;  Service: ENT;  Laterality: Bilateral;  . NASAL SEPTUM SURGERY  01/26/2016  . NASAL SINUS SURGERY Bilateral 01/26/2016   Procedure: BILATERAL ENDOSCOPIC MAXILLARY ANTROSTOMY;  Surgeon: Newman Pies, MD;  Location: [facility] OR;  Service: ENT;  Laterality: Bilateral;  . SHOULDER ARTHROSCOPY WITH ROTATOR CUFF REPAIR Right   . TUBAL LIGATION      Her Family History Is Significant For: Family History  Problem Relation Age of Onset  . Diabetes    . Angina    . Seizures Neg Hx     Her Social History Is Significant For: Social History   Social History  . Marital status:  Widowed    Spouse name: N/A  . Number of children: 1  . Years of education: 25   Occupational History  . Unemployed    Social History Main Topics  . Smoking status: Current Every Day Smoker    Packs/day: 0.50    Years: 41.00    Types: Cigarettes  . Smokeless tobacco: Never Used  . Alcohol use No  . Drug use: No  . Sexual activity: Not Asked   Other Topics Concern  . None   Social History Narrative   Lives with boyfriend   Caffeine use: Drinks 1 soda a day     Her Allergies Are:  No Known  Allergies:   Her Current Medications Are:  Outpatient Encounter Prescriptions as of 02/15/2016  Medication Sig  . acetaminophen (TYLENOL) 500 MG tablet Take 1,000 mg by mouth every 6 (six) hours as needed for mild pain.  Marland Kitchen albuterol (PROVENTIL) (2.5 MG/3ML) 0.083% nebulizer solution Take 3 mLs (2.5 mg total) by nebulization every 6 (six) hours as needed for wheezing.  Marland Kitchen amitriptyline (ELAVIL) 25 MG tablet Take 25 mg by mouth at bedtime.  Marland Kitchen amoxicillin (AMOXIL) 875 MG tablet Take 1 tablet (875 mg total) by mouth 2 (two) times daily.  Marland Kitchen atorvastatin (LIPITOR) 20 MG tablet Take 1 tablet (20 mg total) by mouth daily.  . Blood Glucose Monitoring Suppl (ACCU-CHEK AVIVA) device Use as instructed  . buPROPion (WELLBUTRIN SR) 150 MG 12 hr tablet Take 150 mg by mouth 2 (two) times daily.  . cetirizine (ZYRTEC) 10 MG tablet Take 10 mg by mouth daily.  . citalopram (CELEXA) 10 MG tablet Take 10 mg by mouth daily.  . clonazePAM (KLONOPIN) 0.5 MG tablet Take 0.25-0.5 mg by mouth every 8 (eight) hours as needed for anxiety. for anxiety  . ferrous sulfate 325 (65 FE) MG tablet Take 325 mg by mouth 2 (two) times daily with a meal.  . gabapentin (NEURONTIN) 300 MG capsule Take 300 mg by mouth 2 (two) times daily.  Marland Kitchen glucose blood (ACCU-CHEK AVIVA) test strip To test 4 times a day  . hydrochlorothiazide (MICROZIDE) 12.5 MG capsule Take 12.5 mg by mouth daily.  . Insulin Glargine 300 UNIT/ML SOPN Inject 30 Units into the skin at bedtime.  . Insulin Pen Needle (B-D ULTRAFINE III SHORT PEN) 31G X 8 MM MISC 1 each by Does not apply route as directed.  Marland Kitchen lisinopril (PRINIVIL,ZESTRIL) 20 MG tablet Take 20 mg by mouth daily.  . meloxicam (MOBIC) 15 MG tablet Take 15 mg by mouth daily.  . metFORMIN (GLUCOPHAGE) 850 MG tablet Take 850 mg by mouth 2 (two) times daily with a meal.  . montelukast (SINGULAIR) 10 MG tablet Take 10 mg by mouth at bedtime.  Marland Kitchen omeprazole (PRILOSEC) 20 MG capsule Take 20 mg by mouth daily.  Marland Kitchen  oxyCODONE-acetaminophen (PERCOCET/ROXICET) 5-325 MG tablet Take 1 tablet by mouth every 4 (four) hours as needed for severe pain.  Marland Kitchen PROAIR HFA 108 (90 Base) MCG/ACT inhaler Inhale 2 puffs into the lungs every 4 (four) hours as needed for wheezing or shortness of breath.   Marland Kitchen rOPINIRole (REQUIP) 0.25 MG tablet Take 0.25 mg by mouth every evening.    No facility-administered encounter medications on file as of 02/15/2016.   :  Review of Systems:  Out of a complete 14 point review of systems, all are reviewed and negative with the exception of these symptoms as listed below: Review of Systems  HENT: Positive for rhinorrhea.   Neurological:  Patient has history of nasal surgery.   Snoring reported even after surgery, witnessed apnea, daytime tiredness, takes naps per family, "acting out" dreams, sleep talking.    Epworth Sleepiness Scale 0= would never doze 1= slight chance of dozing 2= moderate chance of dozing 3= high chance of dozing  Sitting and reading:0 Watching TV:2 Sitting inactive in a public place (ex. Theater or meeting):1 As a passenger in a car for an hour without a break:2 Lying down to rest in the afternoon:2 Sitting and talking to someone:0 Sitting quietly after lunch (no alcohol):2 In a car, while stopped in traffic:0 Total:9  Objective:  Neurologic Exam  Physical Exam Physical Examination:   Vitals:   02/15/16 1525  BP: (!) 144/80  Pulse: 78  Resp: 18   General Examination: The patient is a very pleasant 60 y.o. female in no acute distress. She appears well-developed and well-nourished and adequately groomed.   HEENT: Normocephalic, atraumatic, pupils are equal, round and reactive to light and accommodation. Funduscopic exam is normal with sharp disc margins noted. Extraocular tracking is good without limitation to gaze excursion or nystagmus noted. Normal smooth pursuit is noted. Hearing is grossly intact. Tympanic membranes are clear bilaterally. Face  is symmetric with normal facial animation and normal facial sensation. Speech is clear with no dysarthria noted. There is no hypophonia. There is no lip, neck/head, jaw or voice tremor. Neck is supple with full range of passive and active motion. There are no carotid bruits on auscultation. Oropharynx exam reveals: moderate mouth dryness, marginal dental hygiene with partials upper and lower and moderate airway crowding, due to thicker soft palate and larger uvula. Mallampati is class II. Tongue protrudes centrally and palate elevates symmetrically. Tonsils are small, more visible on L. Neck size is 16.5 inches.   Chest: Clear to auscultation without wheezing, rhonchi or crackles noted.  Heart: S1+S2+0, regular and normal without murmurs, rubs or gallops noted.   Abdomen: Soft, non-tender and non-distended with normal bowel sounds appreciated on auscultation.  Extremities: There is trace pitting edema in the distal lower extremities bilaterally, around the ankles. Pedal pulses are intact.  Skin: Warm and dry without trophic changes noted. There are no varicose veins.  Musculoskeletal: exam reveals no obvious joint deformities, tenderness or joint swelling or erythema.   Neurologically:  Mental status: The patient is awake, alert and oriented in all 4 spheres. Her immediate and remote memory, attention, language skills and fund of knowledge are appropriate. There is no evidence of aphasia, agnosia, apraxia or anomia. Speech is clear with normal prosody and enunciation. Thought process is linear. Mood is normal and affect is normal.  Cranial nerves II - XII are as described above under HEENT exam. In addition: shoulder shrug is normal with equal shoulder height noted. Motor exam: Normal bulk, strength and tone is noted. There is no drift, tremor or rebound. Romberg is negative. Reflexes are 2+ throughout. Fine motor skills and coordination: intact with normal finger taps, normal hand movements, normal  rapid alternating patting, normal foot taps and normal foot agility.  Cerebellar testing: No dysmetria or intention tremor on finger to nose testing. Heel to shin is unremarkable bilaterally. There is no truncal or gait ataxia.  Sensory exam: intact to light touch in the upper and lower extremities.  Gait, station and balance: She stands easily. No veering to one side is noted. No leaning to one side is noted. Posture is age-appropriate and stance is narrow based. Gait shows normal stride length and normal  pace. No problems turning are noted. Tandem walk is difficult for her .   Assessment and Plan:  In summary, Lurlean HornsGeraldine J Brouhard is a very pleasant 60 y.o.-year old female with an underlying medical history of type 2 diabetes, hypertension, depression, seasonal allergies, syncopal spells, smoking, and obesity, whose history and physical exam are concerning for obstructive sleep apnea (OSA). She is recent s/p nasal septoplasty and inferior turbinate reduction b/l. In addition, she has a history of parasomnias, including sleep talking. I explained, that there is no specific treatment for parasomnias; sometimes parasomnias are the result of poor sleep consolidation and an underlying organic sleep disorder such as sleep apnea. She has RLS and is on medication for this, which helps.  I had a long chat with the patient and her daughter in law about my findings and the diagnosis of OSA, its prognosis and treatment options. We talked about medical treatments, surgical interventions and non-pharmacological approaches. I explained in particular the risks and ramifications of untreated moderate to severe OSA, especially with respect to developing cardiovascular disease down the Road, including congestive heart failure, difficult to treat hypertension, cardiac arrhythmias, or stroke. Even type 2 diabetes has, in part, been linked to untreated OSA. Symptoms of untreated OSA include daytime sleepiness, memory problems,  mood irritability and mood disorder such as depression and anxiety, lack of energy, as well as recurrent headaches, especially morning headaches. We talked about trying to maintain a healthy lifestyle in general, as well as the importance of weight control. I encouraged the patient to eat healthy, exercise daily and keep well hydrated, to keep a scheduled bedtime and wake time routine, to not skip any meals and eat healthy snacks in between meals. I advised the patient not to drive when feeling sleepy. She is advised to stop smoking.  I recommended the following at this time: sleep study with potential positive airway pressure titration. (We will score hypopneas at 4% and split the sleep study into diagnostic and treatment portion, if the estimated. 2 hour AHI is >20/h).   I explained the sleep test procedure to the patient and also outlined possible surgical and non-surgical treatment options of OSA, including the use of a custom-made dental device (which would require a referral to a specialist dentist or oral surgeon), upper airway surgical options, such as pillar implants, radiofrequency surgery, tongue base surgery, and UPPP (which would involve a referral to an ENT surgeon). Rarely, jaw surgery such as mandibular advancement may be considered.  I also explained the CPAP treatment option to the patient, who indicated that she would be willing to try CPAP if the need arises. I explained the importance of being compliant with PAP treatment, not only for insurance purposes but primarily to improve Her symptoms, and for the patient's long term health benefit, including to reduce Her cardiovascular risks. I answered all their questions today and the patient and her daughter in law were in agreement. I would like to see her back after the sleep study is completed and encouraged her to call with any interim questions, concerns, problems or updates.   Thank you very much for allowing me to participate in the  care of this nice patient. If I can be of any further assistance to you please do not hesitate to talk to me.   Sincerely,   Huston FoleySaima Treyce Spillers, MD, PhD

## 2016-02-16 ENCOUNTER — Encounter: Payer: Medicare Other | Attending: "Endocrinology | Admitting: Nutrition

## 2016-02-16 ENCOUNTER — Encounter: Payer: Self-pay | Admitting: Nutrition

## 2016-02-16 VITALS — Ht 61.0 in | Wt 187.0 lb

## 2016-02-16 DIAGNOSIS — E119 Type 2 diabetes mellitus without complications: Secondary | ICD-10-CM | POA: Diagnosis not present

## 2016-02-16 DIAGNOSIS — IMO0002 Reserved for concepts with insufficient information to code with codable children: Secondary | ICD-10-CM

## 2016-02-16 DIAGNOSIS — E669 Obesity, unspecified: Secondary | ICD-10-CM | POA: Insufficient documentation

## 2016-02-16 DIAGNOSIS — F17210 Nicotine dependence, cigarettes, uncomplicated: Secondary | ICD-10-CM | POA: Insufficient documentation

## 2016-02-16 DIAGNOSIS — E118 Type 2 diabetes mellitus with unspecified complications: Secondary | ICD-10-CM

## 2016-02-16 DIAGNOSIS — E1165 Type 2 diabetes mellitus with hyperglycemia: Secondary | ICD-10-CM

## 2016-02-16 DIAGNOSIS — Z713 Dietary counseling and surveillance: Secondary | ICD-10-CM | POA: Diagnosis not present

## 2016-02-16 DIAGNOSIS — Z794 Long term (current) use of insulin: Secondary | ICD-10-CM

## 2016-02-16 NOTE — Progress Notes (Signed)
  Medical Nutrition Therapy:  Appt start time: 1330 end time:  1430.   Assessment:  Primary concerns today: Diabetes and obesity.  Lives with her boyfriend. Her daughter in law is here with her today. She does the cooking and shopping. Food fried, baked and they eat out some. Smokes 1/2 pack per day.  Eats 2 meals per day and snacks.    Metformiin 2 g/d; Toujeo 30 units a day at night. Physical actvity: ADL Testing blood sugars once a day. FBS 175 -220 mg/dl.  Diet is excessive in calories, fat and sodium and low in fresh fruits and vegetables. Lab Results  Component Value Date   HGBA1C 9.3 (H) 01/18/2016     Preferred Learning Style:    No preference indicated   Learning Readiness:  Ready  Change in progress   MEDICATIONS:    DIETARY INTAKE:.    24-hr recall:  B ( AM): Rice krispies 1 cup, with 2% milk,   Snk ( AM): Diet Mt dew L ( PM): Chocolate cake sugar free Snk ( PM):Diet Mt Dew CF D ( PM): Pintos beans and cabbage 1 cup,  Snk ( PM):nothing Beverages:  Dt Mt Dew, coffee  Usual physical activity: ADL  Estimated energy needs: 1500 calories 170 g carbohydrates 112 g protein 42 g fat  Progress Towards Goal(s):  In progress.   Nutritional Diagnosis:  NB-1.1 Food and nutrition-related knowledge deficit As related to Diabetes.  As evidenced by A1C 9.3%..    Intervention:  Nutrition and Diabetes education provided on My Plate, CHO counting, meal planning, portion sizes, timing of meals, avoiding snacks between meals unless having a low blood sugar, target ranges for A1C and blood sugars, signs/symptoms and treatment of hyper/hypoglycemia, monitoring blood sugars, taking medications as prescribed, benefits of exercising 30 minutes per day and prevention of complications of DM.   Goals 1. Follow Plate Method 2. Cut out Diet Sodas 3. Drink 3 bottles of water 4. Increase fresh fruits and vegetables 5. Test blood sugar twice a day; am and before bed time. 6. Get  A1C down to 7%  Teaching Method Utilized:  Visual Auditory Hands on  Handouts given during visit include:  The Plate Method   Meal Plan Card  Diabetes Instructions.   Barriers to learning/adherence to lifestyle change: None  Demonstrated degree of understanding via:  Teach Back   Monitoring/Evaluation:  Dietary intake, exercise, meal planning, SBG, and body weight in 1 month(s). +

## 2016-02-16 NOTE — Patient Instructions (Addendum)
Goals 1. Follow Plate Method 2. Cut out Diet Sodas 3. Drink 3 bottles of water 4. Increase fresh fruits and vegetables 5. Test blood sugar twice a day; am and before bed time. 6. Get A1C down to 7%

## 2016-02-24 ENCOUNTER — Ambulatory Visit: Payer: Self-pay | Admitting: Neurology

## 2016-02-29 ENCOUNTER — Ambulatory Visit (INDEPENDENT_AMBULATORY_CARE_PROVIDER_SITE_OTHER): Payer: Medicare Other | Admitting: Neurology

## 2016-02-29 ENCOUNTER — Encounter: Payer: Self-pay | Admitting: Neurology

## 2016-02-29 VITALS — BP 135/74 | HR 79 | Ht 61.0 in | Wt 183.6 lb

## 2016-02-29 DIAGNOSIS — R404 Transient alteration of awareness: Secondary | ICD-10-CM

## 2016-02-29 NOTE — Patient Instructions (Addendum)
Remember to drink plenty of fluid, eat healthy meals and do not skip any meals. Try to eat protein with a every meal and eat a healthy snack such as fruit or nuts in between meals. Try to keep a regular sleep-wake schedule and try to exercise daily, particularly in the form of walking, 20-30 minutes a day, if you can. .   Our phone number is [phone]. We also have an after hours call service for urgent matters and there is a physician on-call for urgent questions. For any emergencies you know to call 911 or go to the nearest emergency room   

## 2016-02-29 NOTE — Progress Notes (Signed)
[facility]    Provider:  Dr Lucia Gaskins Referring Provider: Estanislado Pandy, MD Primary Care Physician:  Estanislado Pandy, MD CC:  Syncope  Interval 02/29/2016: MRI of the brain was unremarkable and did not show any cause for seizures. Routine and extended 3-day EEG were normal. In June she "went out" she was sitting and she felt funny and she "went down". She was aware that it was going to happen. Family sat her down on the ground. She had lit a cigarrette and was standing. Her eyes close but not unconscious she does not lose awareness. She doesn't answer questions right away, she know the people who are around her and she remembers it all, she remembered it, she remembers herself falling, she was not lightheaded, no CP. Lasted 2-3 minutes. She is foggy for a while. Episodes always occur upon standing or walking for a few minutes. Never when she is sitting. Never with laying. She felt lightheaded this last time, she felt something coming, like she was going to pass out. She is also a chronic smoker. She smokes every day may be hypoxemia after walking. She has also has untreated sleep apnea. She has oxygen at home but she has not used it since she had some sinus surgery.   HPI:  Victoria Lara is a 60 y.o. female here as a referral for syncope. Past medical history of hypertension, COPD, diabetes, neuropathy, depression. She is a current, every day smoker. Rerral from Dr. Neita Carp for syncope syncope for 12 years. She passes out about 5-7 seconds and then wakes up, falls to the ground. Daughter here and provides information. Happens every 3-4 months. She will be walking and she knows when it happens, she raises her hand and says she is "going down", she feels lightheaded, blood pressure and glucose ok during episodes as daughter checks. She is very stressed and has a lot of anxiety. She used to be on Xanax. She still had symptoms of syncope on the Xanax. She can hear everyone during the  episodes, her eyes blink, in 3-5 seconds she recognized her family and no significant confusion. She does say she has altered awareness during the event however. She denies SOB, she denies CP during the events. She endorses feeling weak during the episodes. She is "mumbly" when talking. It could be stress. Patient does not sleep. She snores "ridiculously loud", she falls asleep at the drop of a hat, sleeps all day. She also has RLS. They would like a sleep evaluation. No other focal neurologic symptoms. No personal or family history of seizures.  Reviewed notes, labs and imaging from outside physicians, which showed:   Reviewed EKG tracing, QTC 453.  Review of Systems: Patient complains of symptoms per HPI as well as the following symptoms: weight gain, trouble swallowing, depression, anxiety, not enough sleep, change in appetite. Pertinent negatives per HPI. All others negative.    Social History   Social History  . Marital status: Widowed    Spouse name: N/A  . Number of children: 1  . Years of education: 15   Occupational History  . Unemployed    Social History Main Topics  . Smoking status: Current Every Day Smoker    Packs/day: 0.50    Years: 41.00    Types: Cigarettes  . Smokeless tobacco: Never Used  . Alcohol use No  . Drug use: No  . Sexual activity: Not on file   Other Topics Concern  . Not on file   Social  History Narrative   Lives with boyfriend   Caffeine use: Drinks 1 soda a day     Family History  Problem Relation Age of Onset  . Diabetes    . Angina    . Seizures Neg Hx     Past Medical History:  Diagnosis Date  . Anemia   . Anxiety   . Arthritis   . Bronchitis   . Complication of anesthesia    had to come back to ER after shoulder surgery at [facility]; hard to wake up; low O2 sats  . Depression   . Diabetes mellitus without complication (HCC)   . GERD (gastroesophageal reflux disease)   . Hyperlipidemia   . Hypertension   . Neuropathy  (HCC)    Takes Gabapentin  . Pneumonia 2016  . Restless leg syndrome   . Seasonal allergies   . Shortness of breath dyspnea   . Syncope   . TIA (transient ischemic attack) 2006    Past Surgical History:  Procedure Laterality Date  . BREAST LUMPECTOMY Bilateral   . CATARACT EXTRACTION W/PHACO Right 08/20/2014   Procedure: CATARACT EXTRACTION PHACO AND INTRAOCULAR LENS PLACEMENT (IOC);  Surgeon: Gemma Payor, MD;  Location: [facility];  Service: Ophthalmology;  Laterality: Right;  CDE:5.31  . HAND SURGERY Bilateral   . NASAL SEPTOPLASTY W/ TURBINOPLASTY Bilateral 01/26/2016   Procedure: NASAL SEPTOPLASTY WITH BILATERAL TURBINATE REDUCTION;  Surgeon: Newman Pies, MD;  Location: [facility];  Service: ENT;  Laterality: Bilateral;  . NASAL SEPTUM SURGERY  01/26/2016  . NASAL SINUS SURGERY Bilateral 01/26/2016   Procedure: BILATERAL ENDOSCOPIC MAXILLARY ANTROSTOMY;  Surgeon: Newman Pies, MD;  Location: [facility] OR;  Service: ENT;  Laterality: Bilateral;  . SHOULDER ARTHROSCOPY WITH ROTATOR CUFF REPAIR Right   . TUBAL LIGATION      Current Outpatient Prescriptions  Medication Sig Dispense Refill  . acetaminophen (TYLENOL) 500 MG tablet Take 1,000 mg by mouth every 6 (six) hours as needed for mild pain.    Marland Kitchen albuterol (PROVENTIL) (2.5 MG/3ML) 0.083% nebulizer solution Take 3 mLs (2.5 mg total) by nebulization every 6 (six) hours as needed for wheezing. 75 mL 12  . amitriptyline (ELAVIL) 25 MG tablet Take 25 mg by mouth at bedtime.    Marland Kitchen amoxicillin (AMOXIL) 875 MG tablet Take 1 tablet (875 mg total) by mouth 2 (two) times daily. 10 tablet 0  . atorvastatin (LIPITOR) 20 MG tablet Take 1 tablet (20 mg total) by mouth daily. 30 tablet 3  . Blood Glucose Monitoring Suppl (ACCU-CHEK AVIVA) device Use as instructed 1 each 0  . buPROPion (WELLBUTRIN SR) 150 MG 12 hr tablet Take 150 mg by mouth 2 (two) times daily.  1  . cetirizine (ZYRTEC) 10 MG tablet Take 10 mg by mouth daily.    . citalopram (CELEXA) 10 MG tablet Take 10 mg  by mouth daily.  6  . clonazePAM (KLONOPIN) 0.5 MG tablet Take 0.25-0.5 mg by mouth every 8 (eight) hours as needed for anxiety. for anxiety  3  . ferrous sulfate 325 (65 FE) MG tablet Take 325 mg by mouth 2 (two) times daily with a meal.    . gabapentin (NEURONTIN) 300 MG capsule Take 300 mg by mouth 2 (two) times daily.    Marland Kitchen glucose blood (ACCU-CHEK AVIVA) test strip To test 4 times a day 150 each 3  . hydrochlorothiazide (MICROZIDE) 12.5 MG capsule Take 12.5 mg by mouth daily.    . Insulin Glargine 300 UNIT/ML SOPN Inject 30 Units  into the skin at bedtime. 3 pen 2  . Insulin Pen Needle (B-D ULTRAFINE III SHORT PEN) 31G X 8 MM MISC 1 each by Does not apply route as directed. 100 each 3  . lisinopril (PRINIVIL,ZESTRIL) 20 MG tablet Take 20 mg by mouth daily.    . meloxicam (MOBIC) 15 MG tablet Take 15 mg by mouth daily.    . metFORMIN (GLUCOPHAGE) 850 MG tablet Take 850 mg by mouth 2 (two) times daily with a meal.    . montelukast (SINGULAIR) 10 MG tablet Take 10 mg by mouth at bedtime.  4  . omeprazole (PRILOSEC) 20 MG capsule Take 20 mg by mouth daily.    Marland Kitchen. oxyCODONE-acetaminophen (PERCOCET/ROXICET) 5-325 MG tablet Take 1 tablet by mouth every 4 (four) hours as needed for severe pain. 20 tablet 0  . PROAIR HFA 108 (90 Base) MCG/ACT inhaler Inhale 2 puffs into the lungs every 4 (four) hours as needed for wheezing or shortness of breath.   0  . rOPINIRole (REQUIP) 0.25 MG tablet Take 0.25 mg by mouth every evening.      No current facility-administered medications for this visit.     Allergies as of 02/29/2016  . (No Known Allergies)    Vitals: BP 135/74 (BP Location: Right Arm, Patient Position: Sitting, Cuff Size: Normal)   Pulse 79   Ht 5\' 1"  (1.549 m)   Wt 183 lb 9.6 oz (83.3 kg)   BMI 34.69 kg/m  Last Weight:  Wt Readings from Last 1 Encounters:  02/29/16 183 lb 9.6 oz (83.3 kg)   Last Height:   Ht Readings from Last 1 Encounters:  02/29/16 5\' 1"  (1.549 m)        Physical exam: Exam: Gen: NAD, conversant, well nourised, obese, well groomed                     CV: RRR, no MRG. No Carotid Bruits. No peripheral edema, warm, nontender Eyes: Conjunctivae clear without exudates or hemorrhage  Neuro: Detailed Neurologic Exam  Speech:    Speech is normal; fluent and spontaneous with normal comprehension.  Cognition:    The patient is oriented to person, place, and time;     recent and remote memory intact;     language fluent;     normal attention, concentration,     fund of knowledge Cranial Nerves:    The pupils are equal, round, and reactive to light. The fundi are normal and spontaneous venous pulsations are present. Visual fields are full to finger confrontation. Extraocular movements are intact. Trigeminal sensation is intact and the muscles of mastication are normal. The face is symmetric. The palate elevates in the midline. Hearing intact. Voice is normal. Shoulder shrug is normal. The tongue has normal motion without fasciculations.   Coordination:    Normal finger to nose and heel to shin. Normal rapid alternating movements.   Gait:    Wide based large body habitus  Motor Observation:    No asymmetry, no atrophy, and no involuntary movements noted. Tone:    Normal muscle tone.    Posture:    Posture is normal. normal erect    Strength: Right arm in pain, left 5/5, bilateral hip flexion weakness otherwise intact.     Strength is V/V in the upper and lower limbs.      Sensation: intact to LT     Reflex Exam:  DTR's: Absent AJs otherwise brisk. Deep tendon reflexes in the upper and lower extremities  are normal bilaterally.   Toes:    The toes are downgoing bilaterally.   Clonus:    Clonus is absent.      Assessment/Plan:  60 year old female with 12 years of episodes of loss of consciousness versus altered awareness for 5-7 seconds at a time where she will fall to the floor, often knows the events are coming, can  sometimes hear what is going on during the events. Events happen after patient is standing up after she has been walking for several minutes. Never occur when she is sitting or laying down. Routine EEG and three-day extended ambulatory EEG were negative for epileptiform activity. MRI of the brain did not show a seizure focus.   - This last event she has been standing for some time and then lit a cigarette and then felt lightheaded she remembers the entire event she had to sit on the floor and then felt better. Not consistent with seizure. May explore hypoxemia vs orthostatic hypotension after she has been walking for several minutes.  - Snoring, excessive daytime fatigue, morbidly obese: Sleep study with Dr. Frances Furbish pending. ESS 17. Likely OSA untreated. MRI of the brain showed no seizure focus unremarkable EEG routine and then a 3-day eeg without epileptiform activity Patient is unable to drive, operate heavy machinery, perform activities at heights or participate in water activities until 6 months event free  Cc: Dr. Neita Carp Naomie Dean, MD  [facility] [address] [place], [place] [address]  Phone [phone] Fax [phone]  A total of 15 minutes was spent face-to-face with this patient. Over half this time was spent on counseling patient on the altered mental status diagnosis and different diagnostic and therapeutic options available.

## 2016-03-10 DIAGNOSIS — J449 Chronic obstructive pulmonary disease, unspecified: Secondary | ICD-10-CM | POA: Diagnosis not present

## 2016-03-14 DIAGNOSIS — E7800 Pure hypercholesterolemia, unspecified: Secondary | ICD-10-CM | POA: Diagnosis not present

## 2016-03-14 DIAGNOSIS — I10 Essential (primary) hypertension: Secondary | ICD-10-CM | POA: Diagnosis not present

## 2016-03-14 DIAGNOSIS — E1165 Type 2 diabetes mellitus with hyperglycemia: Secondary | ICD-10-CM | POA: Diagnosis not present

## 2016-03-14 LAB — HEMOGLOBIN A1C: HEMOGLOBIN A1C: 7.6

## 2016-03-16 DIAGNOSIS — Z720 Tobacco use: Secondary | ICD-10-CM | POA: Diagnosis not present

## 2016-03-16 DIAGNOSIS — Z1389 Encounter for screening for other disorder: Secondary | ICD-10-CM | POA: Diagnosis not present

## 2016-03-16 DIAGNOSIS — Z13220 Encounter for screening for lipoid disorders: Secondary | ICD-10-CM | POA: Diagnosis not present

## 2016-03-16 DIAGNOSIS — J0101 Acute recurrent maxillary sinusitis: Secondary | ICD-10-CM | POA: Diagnosis not present

## 2016-03-16 DIAGNOSIS — E1165 Type 2 diabetes mellitus with hyperglycemia: Secondary | ICD-10-CM | POA: Diagnosis not present

## 2016-03-22 ENCOUNTER — Ambulatory Visit (INDEPENDENT_AMBULATORY_CARE_PROVIDER_SITE_OTHER): Payer: Medicare Other | Admitting: "Endocrinology

## 2016-03-22 ENCOUNTER — Encounter: Payer: Self-pay | Admitting: Nutrition

## 2016-03-22 ENCOUNTER — Encounter: Payer: Self-pay | Admitting: "Endocrinology

## 2016-03-22 ENCOUNTER — Encounter: Payer: Medicare Other | Attending: "Endocrinology | Admitting: Nutrition

## 2016-03-22 VITALS — BP 123/69 | HR 84 | Ht 61.0 in | Wt 184.0 lb

## 2016-03-22 VITALS — Ht 60.0 in | Wt 184.0 lb

## 2016-03-22 DIAGNOSIS — E785 Hyperlipidemia, unspecified: Secondary | ICD-10-CM | POA: Diagnosis not present

## 2016-03-22 DIAGNOSIS — F17210 Nicotine dependence, cigarettes, uncomplicated: Secondary | ICD-10-CM | POA: Diagnosis not present

## 2016-03-22 DIAGNOSIS — E669 Obesity, unspecified: Secondary | ICD-10-CM | POA: Diagnosis not present

## 2016-03-22 DIAGNOSIS — F17200 Nicotine dependence, unspecified, uncomplicated: Secondary | ICD-10-CM

## 2016-03-22 DIAGNOSIS — Z794 Long term (current) use of insulin: Secondary | ICD-10-CM

## 2016-03-22 DIAGNOSIS — E1165 Type 2 diabetes mellitus with hyperglycemia: Secondary | ICD-10-CM

## 2016-03-22 DIAGNOSIS — Z9119 Patient's noncompliance with other medical treatment and regimen: Secondary | ICD-10-CM

## 2016-03-22 DIAGNOSIS — Z720 Tobacco use: Secondary | ICD-10-CM

## 2016-03-22 DIAGNOSIS — Z713 Dietary counseling and surveillance: Secondary | ICD-10-CM | POA: Diagnosis not present

## 2016-03-22 DIAGNOSIS — Z91199 Patient's noncompliance with other medical treatment and regimen due to unspecified reason: Secondary | ICD-10-CM | POA: Insufficient documentation

## 2016-03-22 DIAGNOSIS — E119 Type 2 diabetes mellitus without complications: Secondary | ICD-10-CM

## 2016-03-22 DIAGNOSIS — I10 Essential (primary) hypertension: Secondary | ICD-10-CM

## 2016-03-22 DIAGNOSIS — IMO0002 Reserved for concepts with insufficient information to code with codable children: Secondary | ICD-10-CM

## 2016-03-22 DIAGNOSIS — E118 Type 2 diabetes mellitus with unspecified complications: Secondary | ICD-10-CM

## 2016-03-22 DIAGNOSIS — E1169 Type 2 diabetes mellitus with other specified complication: Secondary | ICD-10-CM

## 2016-03-22 MED ORDER — GLUCOSE BLOOD VI STRP: ORAL_STRIP | 3 refills | Status: DC

## 2016-03-22 MED ORDER — INSULIN GLARGINE 300 UNIT/ML ~~LOC~~ SOPN: 40.0000 [IU] | PEN_INJECTOR | Freq: Every day | SUBCUTANEOUS | 2 refills | Status: DC

## 2016-03-22 NOTE — Patient Instructions (Signed)
Goals 1. Increase low carb vegetables. 2. Increase 30 minutes every day 3. Cut down on cheese, 4. Lose 2 lbs per month 5. Call to see about the smoking cessation program at [facility].

## 2016-03-22 NOTE — Progress Notes (Signed)
  Medical Nutrition Therapy:  Appt start time: 1400end time:  1430  Assessment:  Primary concerns today: Diabetes and obesity.    A1`C 7.6%, down from 9.3%.  She started walking, cut out breads and potatoes. Ran out of strips. She has cut back to 4 cig a day. Referral to smoking classes given. Toujeo 30 units per day. 850 mg of Metformin BID. Drinking more water. Feels better. Not going to the bathroom as much and she isn't as thirsty all the time.   Making progress with her smoking and eating healthier foods.    Wt Readings from Last 3 Encounters:  03/22/16 184 lb (83.5 kg)  02/29/16 183 lb 9.6 oz (83.3 kg)  02/16/16 187 lb (84.8 kg)   Ht Readings from Last 3 Encounters:  03/22/16 5' (1.524 m)  02/29/16 5\' 1"  (1.549 m)  02/16/16 5\' 1"  (1.549 m)   Body mass index is 35.94 kg/m.  Preferred Learning Style:    No preference indicated   Learning Readiness:  Ready  Change in progress   MEDICATIONS:    DIETARY INTAKE:.    24-hr recall:  B ( AM):  Peach OR Rice Krispies with 1% milk,  Snk ( AM):  L ( PM): 1 slice pizza with water, OR Toss salad with lettuce, cukes, tomatoes, Snk ( PM):Diet Mt Dew CF D ( PM):  Toss salad with Ranch Mal[place]urkey ham,  Snk ( PM):nothing Beverages:  Water  Usual physical activity: ADL  Estimated energy needs: 1500 calories 170 g carbohydrates 112 g protein 42 g fat  Progress Towards Goal(s):  In progress.   Nutritional Diagnosis:  NB-1.1 Food and nutrition-related knowledge deficit As related to Diabetes.  As evidenced by A1C 9.3%..    Intervention:  Nutrition and Diabetes education provided on My Plate, CHO counting, meal planning, portion sizes, timing of meals, avoiding snacks between meals unless having a low blood sugar, target ranges for A1C and blood sugars, signs/symptoms and treatment of hyper/hypoglycemia, monitoring blood sugars, taking medications as prescribed, benefits of exercising 30 minutes per day and prevention of  complications of DM.    1.Goals 1. Increase low carb vegetables. 2. Increase 30 minutes every day 3. Cut down on cheese, 4. Lose 2 lbs per month 5. Call to see about the [facility] at [facility].   Teaching Method Utilized:  Visual Auditory Hands on  Handouts given during visit include:  The Plate Method   Meal Plan Card  Diabetes Instructions.   Barriers to learning/adherence to lifestyle change: None  Demonstrated degree of understanding via:  Teach Back   Monitoring/Evaluation:  Dietary intake, exercise, meal planning, SBG, and body weight in 1-3 month(s). +

## 2016-03-22 NOTE — Progress Notes (Signed)
Subjective:    Patient ID: Victoria Lara, female    DOB: Jun 12, 1956. Patient is being seen in f/u for management of diabetes requested by  Juliette AlcideBURDINE,STEVEN E, MD  Past Medical History:  Diagnosis Date  . Anemia   . Anxiety   . Arthritis   . Bronchitis   . Complication of anesthesia    had to come back to ER after shoulder surgery at [facility]; hard to wake up; low O2 sats  . Depression   . Diabetes mellitus without complication (HCC)   . GERD (gastroesophageal reflux disease)   . Hyperlipidemia   . Hypertension   . Neuropathy (HCC)    Takes Gabapentin  . Pneumonia 2016  . Restless leg syndrome   . Seasonal allergies   . Shortness of breath dyspnea   . Syncope   . TIA (transient ischemic attack) 2006   Past Surgical History:  Procedure Laterality Date  . BREAST LUMPECTOMY Bilateral   . CATARACT EXTRACTION W/PHACO Right 08/20/2014   Procedure: CATARACT EXTRACTION PHACO AND INTRAOCULAR LENS PLACEMENT (IOC);  Surgeon: Gemma PayorKerry Hunt, MD;  Location: [facility];  Service: Ophthalmology;  Laterality: Right;  CDE:5.31  . HAND SURGERY Bilateral   . NASAL SEPTOPLASTY W/ TURBINOPLASTY Bilateral 01/26/2016   Procedure: NASAL SEPTOPLASTY WITH BILATERAL TURBINATE REDUCTION;  Surgeon: Newman PiesSu Teoh, MD;  Location: [facility];  Service: ENT;  Laterality: Bilateral;  . NASAL SEPTUM SURGERY  01/26/2016  . NASAL SINUS SURGERY Bilateral 01/26/2016   Procedure: BILATERAL ENDOSCOPIC MAXILLARY ANTROSTOMY;  Surgeon: Newman PiesSu Teoh, MD;  Location: [facility];  Service: ENT;  Laterality: Bilateral;  . SHOULDER ARTHROSCOPY WITH ROTATOR CUFF REPAIR Right   . TUBAL LIGATION     Social History   Social History  . Marital status: Widowed    Spouse name: N/A  . Number of children: 1  . Years of education: 7112   Occupational History  . Unemployed    Social History Main Topics  . Smoking status: Current Every Day Smoker    Packs/day: 0.50    Years: 41.00    Types: Cigarettes  . Smokeless tobacco: Never Used  . Alcohol use  No  . Drug use: No  . Sexual activity: Not on file   Other Topics Concern  . Not on file   Social History Narrative   Lives with boyfriend   Caffeine use: Drinks 1 soda a day    Outpatient Encounter Prescriptions as of 03/22/2016  Medication Sig  . acetaminophen (TYLENOL) 500 MG tablet Take 1,000 mg by mouth every 6 (six) hours as needed for mild pain.  Marland Kitchen. albuterol (PROVENTIL) (2.5 MG/3ML) 0.083% nebulizer solution Take 3 mLs (2.5 mg total) by nebulization every 6 (six) hours as needed for wheezing.  Marland Kitchen. amitriptyline (ELAVIL) 25 MG tablet Take 25 mg by mouth at bedtime.  Marland Kitchen. amoxicillin (AMOXIL) 875 MG tablet Take 1 tablet (875 mg total) by mouth 2 (two) times daily.  Marland Kitchen. atorvastatin (LIPITOR) 20 MG tablet Take 1 tablet (20 mg total) by mouth daily.  . Blood Glucose Monitoring Suppl (ACCU-CHEK AVIVA) device Use as instructed  . buPROPion (WELLBUTRIN SR) 150 MG 12 hr tablet Take 150 mg by mouth 2 (two) times daily.  . cetirizine (ZYRTEC) 10 MG tablet Take 10 mg by mouth daily.  . citalopram (CELEXA) 10 MG tablet Take 10 mg by mouth daily.  . clonazePAM (KLONOPIN) 0.5 MG tablet Take 0.25-0.5 mg by mouth every 8 (eight) hours as needed for anxiety. for anxiety  .  ferrous sulfate 325 (65 FE) MG tablet Take 325 mg by mouth 2 (two) times daily with a meal.  . gabapentin (NEURONTIN) 300 MG capsule Take 300 mg by mouth 2 (two) times daily.  Marland Kitchen glucose blood (ACCU-CHEK AVIVA) test strip To test 4 times a day  . glucose blood (ACCU-CHEK AVIVA) test strip Use 2 times a day to test glucose  . hydrochlorothiazide (MICROZIDE) 12.5 MG capsule Take 12.5 mg by mouth daily.  . Insulin Glargine 300 UNIT/ML SOPN Inject 40 Units into the skin at bedtime.  . Insulin Pen Needle (B-D ULTRAFINE III SHORT PEN) 31G X 8 MM MISC 1 each by Does not apply route as directed.  Marland Kitchen lisinopril (PRINIVIL,ZESTRIL) 20 MG tablet Take 20 mg by mouth daily.  . meloxicam (MOBIC) 15 MG tablet Take 15 mg by mouth daily.  . metFORMIN  (GLUCOPHAGE) 850 MG tablet Take 850 mg by mouth 2 (two) times daily with a meal.  . montelukast (SINGULAIR) 10 MG tablet Take 10 mg by mouth at bedtime.  Marland Kitchen omeprazole (PRILOSEC) 20 MG capsule Take 20 mg by mouth daily.  Marland Kitchen oxyCODONE-acetaminophen (PERCOCET/ROXICET) 5-325 MG tablet Take 1 tablet by mouth every 4 (four) hours as needed for severe pain.  Marland Kitchen PROAIR HFA 108 (90 Base) MCG/ACT inhaler Inhale 2 puffs into the lungs every 4 (four) hours as needed for wheezing or shortness of breath.   Marland Kitchen rOPINIRole (REQUIP) 0.25 MG tablet Take 0.25 mg by mouth every evening.   . [DISCONTINUED] Insulin Glargine 300 UNIT/ML SOPN Inject 30 Units into the skin at bedtime.   No facility-administered encounter medications on file as of 03/22/2016.    ALLERGIES: No Known Allergies VACCINATION STATUS:  There is no immunization history on file for this patient.  Diabetes  She presents for her follow-up diabetic visit. She has type 2 diabetes mellitus. Onset time: She was diagnosed at approximate age of 44 years. Her disease course has been improving. There are no hypoglycemic associated symptoms. Pertinent negatives for hypoglycemia include no confusion, headaches, pallor or seizures. Associated symptoms include blurred vision and fatigue. Pertinent negatives for diabetes include no chest pain and no polyphagia. There are no hypoglycemic complications. Symptoms are improving. Diabetic complications include a CVA. (She is a chronic heavy smoker.) Risk factors for coronary artery disease include diabetes mellitus, dyslipidemia, hypertension, obesity, sedentary lifestyle and tobacco exposure. Current diabetic treatment includes oral agent (monotherapy). Her weight is stable. She is following a generally unhealthy diet. When asked about meal planning, she reported none. She rarely participates in exercise. Her breakfast blood glucose range is generally 180-200 mg/dl. Her overall blood glucose range is 180-200 mg/dl. An ACE  inhibitor/angiotensin II receptor blocker is being taken. Eye exam is current.  Hyperlipidemia  This is a new diagnosis. The problem is uncontrolled. Recent lipid tests were reviewed and are high. Exacerbating diseases include diabetes and obesity. Pertinent negatives include no chest pain, myalgias or shortness of breath. She is currently on no antihyperlipidemic treatment. Risk factors for coronary artery disease include diabetes mellitus, dyslipidemia, hypertension and a sedentary lifestyle.  Hypertension  This is a chronic problem. The current episode started more than 1 year ago. Associated symptoms include blurred vision. Pertinent negatives include no chest pain, headaches, palpitations or shortness of breath. Risk factors for coronary artery disease include diabetes mellitus, dyslipidemia, sedentary lifestyle and smoking/tobacco exposure. Past treatments include ACE inhibitors. Hypertensive end-organ damage includes CVA.       Review of Systems  Constitutional: Positive for fatigue. Negative  for activity change, chills, fever and unexpected weight change.  HENT: Negative for trouble swallowing and voice change.   Eyes: Positive for blurred vision. Negative for visual disturbance.  Respiratory: Negative for cough, shortness of breath and wheezing.   Cardiovascular: Negative for chest pain, palpitations and leg swelling.  Gastrointestinal: Negative for diarrhea, nausea and vomiting.  Endocrine: Negative for cold intolerance, heat intolerance and polyphagia.  Genitourinary: Positive for frequency. Negative for dysuria and flank pain.  Musculoskeletal: Negative for arthralgias and myalgias.  Skin: Negative for color change, pallor, rash and wound.  Neurological: Negative for seizures and headaches.  Psychiatric/Behavioral: Negative for confusion, dysphoric mood and suicidal ideas.  All other systems reviewed and are negative.   Objective:    BP 123/69   Pulse 84   Ht 5\' 1"  (1.549 m)    Wt 184 lb (83.5 kg)   BMI 34.77 kg/m   Wt Readings from Last 3 Encounters:  03/22/16 184 lb (83.5 kg)  03/22/16 184 lb (83.5 kg)  02/29/16 183 lb 9.6 oz (83.3 kg)    Physical Exam  Constitutional: She is oriented to person, place, and time. She appears well-developed.  HENT:  Head: Normocephalic and atraumatic.  Eyes: EOM are normal.  Neck: Normal range of motion. Neck supple. No tracheal deviation present. No thyromegaly present.  Cardiovascular: Normal rate and regular rhythm.   Pulmonary/Chest: Effort normal and breath sounds normal.  Abdominal: Soft. Bowel sounds are normal. There is no tenderness. There is no guarding.  Musculoskeletal: Normal range of motion. She exhibits no edema.  Neurological: She is alert and oriented to person, place, and time. She has normal reflexes. No cranial nerve deficit. Coordination normal.  Skin: Skin is warm and dry. No rash noted. No erythema. No pallor.  Psychiatric:  Hesitant affect.     CMP ( most recent) CMP     Component Value Date/Time   NA 135 01/18/2016 1630   NA 140 11/24/2015 1624   K 4.7 01/18/2016 1630   CL 98 (L) 01/18/2016 1630   CO2 27 01/18/2016 1630   GLUCOSE 245 (H) 01/18/2016 1630   BUN 10 01/18/2016 1630   BUN 24 11/24/2015 1624   CREATININE 0.63 01/18/2016 1630   CALCIUM 9.6 01/18/2016 1630   PROT 6.6 11/24/2015 1624   ALBUMIN 4.2 11/24/2015 1624   AST 25 11/24/2015 1624   ALT 24 11/24/2015 1624   ALKPHOS 79 11/24/2015 1624   BILITOT 0.4 11/24/2015 1624   GFRNONAA >60 01/18/2016 1630   GFRAA >60 01/18/2016 1630     Diabetic Labs (most recent): Lab Results  Component Value Date   HGBA1C 7.6 03/14/2016   HGBA1C 9.3 (H) 01/18/2016   HGBA1C 10.2 11/16/2015   Lipid Panel     Component Value Date/Time   CHOL 174 11/16/2015   TRIG 213 (A) 11/16/2015   HDL 32 (A) 11/16/2015   LDLCALC 99 11/16/2015       Assessment & Plan:   1. Uncontrolled type 2 diabetes mellitus with other circulatory  complication, without long-term current use of insulin (HCC)   - Patient has currently uncontrolled symptomatic type 2 DM since  60 years of age,  with most recent A1c of  7.6%, generally improving from  10.2 %. Recent labs reviewed.   Her diabetes is complicated by TIA, chronic heavy smoking and patient remains at a high risk for more acute and chronic complications of diabetes which include CAD, CVA, CKD, retinopathy, and neuropathy. These are all discussed in detail  with the patient.  - I have counseled the patient on diet management and weight loss, by adopting a carbohydrate restricted/protein rich diet.  - Suggestion is made for patient to avoid simple carbohydrates   from their diet including Cakes , Desserts, Ice Cream,  Soda (  diet and regular) , Sweet Tea , Candies,  Chips, Cookies, Artificial Sweeteners,   and "Sugar-free" Products . This will help patient to have stable blood glucose profile and potentially avoid unintended weight gain.  - I encouraged the patient to switch to  unprocessed or minimally processed complex starch and increased protein intake (animal or plant source), fruits, and vegetables.  - Patient is advised to stick to a routine mealtimes to eat 3 meals  a day and avoid unnecessary snacks ( to snack only to correct hypoglycemia).  - The patient will be scheduled with Norm Salt, RDN, CDE for individualized DM education.  - I have approached patient with the following individualized plan to manage diabetes and patient agrees:   - She came with improved blood glucose profile . - I  will continue with basal insulin associated with monitoring of blood glucose before breakfast and at bedtime.  -Patient is encouraged to call clinic for blood glucose levels less than 70 or above 200 mg /dl. - I  will increase Toujeo to 40 units daily at bedtime. - I will continue metformin 850 mg by mouth twice a day, therapeutically suitable for patient.  - Patient will be  considered for incretin therapy as appropriate next visit. - Patient specific target  A1c;  LDL, HDL, Triglycerides, and  Waist Circumference were discussed in detail.  2) BP/HTN: Controlled. Continue current medications including ACEI/ARB. 3) Lipids/HPL:  Uncontrolled, triglycerides 213, I have added atorvastatin 20 mg by mouth daily at bedtime.  4)  Weight/Diet: CDE Consult  has been initiated , exercise, and detailed carbohydrates information provided.  5) Chronic Care/Health Maintenance:  -Patient is on ACEI and Statin medications and encouraged to continue to follow up with Ophthalmology, Podiatrist at least yearly or according to recommendations, and advised to  quit smoking. I have recommended yearly flu vaccine and pneumonia vaccination at least every 5 years; moderate intensity exercise for up to 150 minutes weekly; and  sleep for at least 7 hours a day.  - 25 minutes of time was spent on the care of this patient , 50% of which was applied for counseling on diabetes complications and their preventions.  - Patient to bring meter and  blood glucose logs during their next visit.   - I advised patient to maintain close follow up with Juliette Alcide, MD for primary care needs.  Follow up plan: - Return in about 3 months (around 06/21/2016) for follow up with pre-visit labs, meter, and logs.  Marquis Lunch, MD Phone: [phone]  Fax: [phone]   03/22/2016, 2:58 PM

## 2016-03-22 NOTE — Patient Instructions (Signed)

## 2016-03-24 ENCOUNTER — Ambulatory Visit: Payer: Self-pay | Admitting: "Endocrinology

## 2016-04-05 DIAGNOSIS — Z23 Encounter for immunization: Secondary | ICD-10-CM | POA: Diagnosis not present

## 2016-04-06 DIAGNOSIS — Z23 Encounter for immunization: Secondary | ICD-10-CM | POA: Diagnosis not present

## 2016-04-09 DIAGNOSIS — J449 Chronic obstructive pulmonary disease, unspecified: Secondary | ICD-10-CM | POA: Diagnosis not present

## 2016-04-18 ENCOUNTER — Other Ambulatory Visit: Payer: Self-pay

## 2016-04-18 ENCOUNTER — Other Ambulatory Visit: Payer: Self-pay | Admitting: "Endocrinology

## 2016-04-18 MED ORDER — INSULIN GLARGINE 300 UNIT/ML ~~LOC~~ SOPN: 40.0000 [IU] | PEN_INJECTOR | Freq: Every day | SUBCUTANEOUS | 2 refills | Status: DC

## 2016-04-20 ENCOUNTER — Other Ambulatory Visit: Payer: Self-pay | Admitting: "Endocrinology

## 2016-05-10 DIAGNOSIS — J449 Chronic obstructive pulmonary disease, unspecified: Secondary | ICD-10-CM | POA: Diagnosis not present

## 2016-05-12 DIAGNOSIS — S99922A Unspecified injury of left foot, initial encounter: Secondary | ICD-10-CM | POA: Diagnosis not present

## 2016-05-26 ENCOUNTER — Ambulatory Visit (INDEPENDENT_AMBULATORY_CARE_PROVIDER_SITE_OTHER): Payer: Medicare Other | Admitting: Neurology

## 2016-05-26 DIAGNOSIS — G4761 Periodic limb movement disorder: Secondary | ICD-10-CM

## 2016-05-26 DIAGNOSIS — G4733 Obstructive sleep apnea (adult) (pediatric): Secondary | ICD-10-CM

## 2016-05-26 DIAGNOSIS — G4720 Circadian rhythm sleep disorder, unspecified type: Secondary | ICD-10-CM

## 2016-06-02 NOTE — Procedures (Signed)
PATIENT'S NAME:  Victoria AuerbachReynolds, Jermaine DOB:      1955-07-16      MR#:    [id number]     DATE OF RECORDING: 05/26/2016 REFERRING M.D.: Naomie DeanAhern, Antonia, PCP: Quintin AltoSteven Burdine, MD Study Performed:  Split-Night Titration Study HISTORY:  60 year old woman with a history of type 2 diabetes, hypertension, depression, seasonal allergies, syncope spells, smoking, and obesity, who reports snoring and excessive daytime somnolence and pauses in her breathing while asleep. The patient endorsed the Epworth Sleepiness Scale at 9 points, Fatigue score = 19.  The patient's weight 186 pounds with a height of 61 (inches), resulting in a BMI of 35. kg/m2. The patient's neck circumference measured 16.5 inches.  CURRENT MEDICATIONS: Tylenol, Proventil, Elavil, Amoxil, Lipitor, Wellbutrin, Zyrtec, Celexa, Klonopin, Ferrous sulfate, Neurontin, Microzide, Insulin, Zestril, Mobic, Glucophage, Singulair, Prilosec, Oxycodone, Percocet, Proair HFA, Requip    PROCEDURE:  This is a multichannel digital polysomnogram utilizing the Somnostar 11.2 system.  Electrodes and sensors were applied and monitored per AASM Specifications.   EEG, EOG, Chin and Limb EMG, were sampled at 200 Hz.  ECG, Snore and Nasal Pressure, Thermal Airflow, Respiratory Effort, CPAP Flow and Pressure, Oximetry was sampled at 50 Hz. Digital video and audio were recorded.      BASELINE STUDY WITHOUT CPAP RESULTS:  Lights Out was at 21:04 and Lights On at 05:11 for the night.  Total recording time (TRT) was 202, with a total sleep time (TST) of 129.5 minutes.   The patient's sleep latency was 90 minutes.  REM latency was 163.5 minutes, which is prolonged.  The sleep efficiency was 64.1 %.    SLEEP ARCHITECTURE: WASO (Wake after sleep onset) was 44 minutes with severe sleep fragmentation noted, Stage N1 was 10 minutes, Stage N2 was 75 minutes, Stage N3 was 34.5 minutes and Stage R (REM sleep) was 10 minutes.  The percentages were Stage N1 7.7%, Stage N2 57.9%, Stage N3  26.6% and Stage R (REM sleep) 7.7%.    Audio and video analysis did not show any abnormal or unusual movements, behaviors, phonations or vocalizations.  The patient took 2 bathroom breaks.  Mild to moderate and also some loud snoring was noted.   RESPIRATORY ANALYSIS:  There were a total of 56 respiratory events:  9 obstructive apneas, 0 central apneas and 0 mixed apneas with a total of 9 apneas and an apnea index (AI) of 4.2. There were 47 hypopneas with a hypopnea index of 21.8. The patient also had 0 respiratory event related arousals (RERAs).  Snoring was noted.     The total APNEA/HYPOPNEA INDEX (AHI) was 25.9 /hour and the total RESPIRATORY DISTURBANCE INDEX was 25.9 /hour.  8 events occurred in REM sleep and 80 events in NREM. The REM AHI was 48/hour versus a non-REM AHI of 24.1 /hour. The patient spent 190 minutes sleep time in the supine position 186 minutes in non-supine. The supine AHI was 56.0 /hour versus a non-supine AHI of 13.7 /hour.  OXYGEN SATURATION & C02:  The wake baseline 02 saturation was 94%, with the lowest being 77%. Time spent below 89% saturation equaled 123 minutes.   PERIODIC LIMB MOVEMENTS:    The patient had a total of 321 Periodic Limb Movements.  The Periodic Limb Movement (PLM) index was 148.7 /hour and the PLM Arousal index was 9.7 /hour.   TITRATION STUDY WITH CPAP RESULTS:   CPAP was initiated at 5 cmH20 with heated humidity per AASM split night standards and pressure was advanced to 17 cmH20  because of hypopneas, apneas and desaturations.  At a PAP pressure of 17 cmH20, there was a reduction of the AHI to 2/hour, O2 nadir was 86%, and brief non-supine REM sleep achieved.   Total recording time (TRT) was 285.5 minutes, with a total sleep time (TST) of 246.5 minutes. The patient's sleep latency was 15 minutes. REM latency was 97 minutes.  The sleep efficiency was 86.3 %.    SLEEP ARCHITECTURE: Wake after sleep was 38 minutes with moderate sleep fragmentation  noted, Stage N1 14.5 minutes, Stage N2 155.5 minutes, Stage N3 0 minutes and Stage R (REM sleep) 76.5 minutes. The percentages were: Stage N1 5.9%, Stage N2 63.1%, Stage N3 0% and Stage R (REM sleep) 31.%, which is increased, in keeping with REM rebound.   RESPIRATORY ANALYSIS:  There were a total of 101 respiratory events: 5 obstructive apneas, 1 central apneas and 1 mixed apneas with a total of 7 apneas and an apnea index (AI) of 1.7. There were 94 hypopneas with a hypopnea index of 22.9 /hour. The patient also had 1 respiratory event related arousals (RERAs).      The total APNEA/HYPOPNEA INDEX  (AHI) was 24.6 /hour and the total RESPIRATORY DISTURBANCE INDEX was 24.8 /hour.  46 events occurred in REM sleep and 55 events in NREM. The REM AHI was 36.1 /hour versus a non-REM AHI of 19.4 /hour. REM sleep was achieved on a pressure of  cm/h2o (AHI was  .) The patient spent 62% of total sleep time in the supine position. The supine AHI was 32.7 /hour, versus a non-supine AHI of 11.5/hour.  OXYGEN SATURATION & C02:  The wake baseline 02 saturation was 86%, with the lowest being 72%. Time spent below 89% saturation equaled 146 minutes.  PERIODIC LIMB MOVEMENTS:    The patient had a total of 139 Periodic Limb Movements. The Periodic Limb Movement (PLM) index was 33.8 /hour and the PLM Arousal index was 6.3 /hour.   Post-study, the patient indicated that sleep was the same as usual.   POLYSOMNOGRAPHY IMPRESSION :   1. Obstructive Sleep Apnea (OSA)  2. Dysfunctions associated with sleep stages or arousals from sleep 3. Periodic Limb Movement Disorder (PLMD)  RECOMMENDATIONS:  1. This patient has moderate to severe obstructive sleep apnea and responded well on CPAP therapy. I will, therefore, start the patient on home CPAP treatment at a pressure of 17 cm via small FFM, with heated humidity. The patient should be reminded to be fully compliant with PAP therapy to improve sleep related symptoms and  decrease long term cardiovascular risks.  The patient should be reminded, that it may take up to 3 months to get fully used to using PAP with all planned sleep. The earlier full compliance is achieved, the better long term compliance tends to be.  2. Please note that untreated obstructive sleep apnea carries additional perioperative morbidity. Patients with significant obstructive sleep apnea should receive perioperative PAP therapy and the surgeons and particularly the anesthesiologist should be informed of the diagnosis and the severity of the sleep disordered breathing. 3. The patient should be cautioned not to drive, work at heights, or operate dangerous or heavy equipment when tired or sleepy. Review and reiteration of good sleep hygiene measures should be pursued with any patient. 4. This study shows sleep fragmentation and abnormal sleep stage percentages; these are nonspecific findings and per se do not signify an intrinsic sleep disorder or a cause for the patient's sleep-related symptoms. Causes include (but are not  limited to) the first night effect of the sleep study, circadian rhythm disturbances, medication effect or an underlying mood disorder or medical problem.  5. Moderate to severe PLMs (periodic limb movements of sleep) were noted during the study with some arousals; clinical correlation is recommended. Medication effect from the patient's antidepressant medication should be considered.  6. The patient will be seen in follow-up by Dr. Frances Furbish at [facility] for discussion of the test results and further management strategies. The referring provider will be notified of the test results.    I certify that I have reviewed the entire raw data recording prior to the issuance of this report in accordance with the Standards of Accreditation of the American Academy of Sleep Medicine (AASM)       Huston Foley, MD, PhD Diplomat, American Board of Psychiatry and Neurology (Neurology and Sleep  Medicine)

## 2016-06-02 NOTE — Addendum Note (Signed)
Addended by: Huston FoleyATHAR, Yaroslav Gombos on: 06/02/2016 01:05 PM   Modules accepted: Orders

## 2016-06-02 NOTE — Progress Notes (Signed)
Diana:  Patient referred by Dr. Lucia GaskinsAhern, seen by me on 02/15/16, split study on 05/26/16. Please call and notify patient that the recent sleep study confirmed the diagnosis of moderate to severe OSA. She did fairly well with CPAP during the study with improvement of the respiratory events. Therefore, I would like start the patient on CPAP therapy at home by prescribing a machine for home use. I placed the order in the chart. The patient will need a follow up appointment with me in 8 to 10 weeks post set up that has to be scheduled; please go ahead and schedule while you have the patient on the phone and make sure patient understands the importance of keeping this window for the FU appointment, as it is often an insurance requirement and failing to adhere to this may result in losing coverage for sleep apnea treatment.  Please re-enforce the importance of compliance with treatment and the need for us to monitor compliance data - again an insurance requirement and good feedback for the patient as far as how they are doing.  Also remind patient, that any upcoming CPAP machine or mask issues, should be first addressed with the DME company. Please ask if patient has a preference regarding DME company.  Please arrange for CPAP set up at home through a DME company of patient's choice - once you have spoken to the patient - and faxed/routed report to PCP and referring MD (if other than PCP), you can close this encounter, thanks,   Huston FoleySaima Cobin Cadavid, MD, PhD [facility] ([facility])

## 2016-06-06 ENCOUNTER — Telehealth: Payer: Self-pay

## 2016-06-06 NOTE — Telephone Encounter (Signed)
I called pt to discuss sleep study results. No answer, left a message asking pt to call me back. 

## 2016-06-06 NOTE — Telephone Encounter (Signed)
-----   Message from Huston FoleySaima Athar, MD sent at 06/02/2016  1:05 PM EST ----- Victoria Lara:  Patient referred by Dr. Lucia GaskinsAhern, seen by me on 02/15/16, split study on 05/26/16. Please call and notify patient that the recent sleep study confirmed the diagnosis of moderate to severe OSA. She did fairly well with CPAP during the study with improvement of the respiratory events. Therefore, I would like start the patient on CPAP therapy at home by prescribing a machine for home use. I placed the order in the chart. The patient will need a follow up appointment with me in 8 to 10 weeks post set up that has to be scheduled; please go ahead and schedule while you have the patient on the phone and make sure patient understands the importance of keeping this window for the FU appointment, as it is often an insurance requirement and failing to adhere to this may result in losing coverage for sleep apnea treatment.  Please re-enforce the importance of compliance with treatment and the need for us to monitor compliance data - again an insurance requirement and good feedback for the patient as far as how they are doing.  Also remind patient, that any upcoming CPAP machine or mask issues, should be first addressed with the DME company. Please ask if patient has a preference regarding DME company.  Please arrange for CPAP set up at home through a DME company of patient's choice - once you have spoken to the patient - and faxed/routed report to PCP and referring MD (if other than PCP), you can close this encounter, thanks,   Huston FoleySaima Athar, MD, PhD [facility] ([facility])

## 2016-06-07 NOTE — Telephone Encounter (Signed)
I spoke to pt. I advised her that her sleep study showed moderate to severe sleep apnea. Pt did fairly well on cpap and therefore Dr. Frances FurbishAthar recommends starting pt on a cpap at home. Pt is agreeable to starting cpap. I advised pt that I would send the order to a DME and they will call her for set up. Will send to [facility]. A follow up appt was scheduled for 09/07/16 at 11:30am after a long discussion on available times for pt. Pt has a difficult schedule. I stressed the importance of a follow up appt with Dr. Frances FurbishAthar for insurance purposes. Pt verbalized understanding of results. Pt had no questions at this time but was encouraged to call back if questions arise.

## 2016-06-09 DIAGNOSIS — J449 Chronic obstructive pulmonary disease, unspecified: Secondary | ICD-10-CM | POA: Diagnosis not present

## 2016-06-22 ENCOUNTER — Ambulatory Visit: Payer: Self-pay | Admitting: Nutrition

## 2016-06-22 ENCOUNTER — Ambulatory Visit: Payer: Self-pay | Admitting: "Endocrinology

## 2016-06-24 ENCOUNTER — Other Ambulatory Visit: Payer: Self-pay | Admitting: "Endocrinology

## 2016-06-29 DIAGNOSIS — S161XXA Strain of muscle, fascia and tendon at neck level, initial encounter: Secondary | ICD-10-CM | POA: Diagnosis not present

## 2016-06-29 DIAGNOSIS — J301 Allergic rhinitis due to pollen: Secondary | ICD-10-CM | POA: Diagnosis not present

## 2016-06-29 DIAGNOSIS — Z720 Tobacco use: Secondary | ICD-10-CM | POA: Diagnosis not present

## 2016-06-29 DIAGNOSIS — E1165 Type 2 diabetes mellitus with hyperglycemia: Secondary | ICD-10-CM | POA: Diagnosis not present

## 2016-06-29 DIAGNOSIS — H109 Unspecified conjunctivitis: Secondary | ICD-10-CM | POA: Diagnosis not present

## 2016-06-30 DIAGNOSIS — E1165 Type 2 diabetes mellitus with hyperglycemia: Secondary | ICD-10-CM | POA: Diagnosis not present

## 2016-06-30 DIAGNOSIS — I10 Essential (primary) hypertension: Secondary | ICD-10-CM | POA: Diagnosis not present

## 2016-06-30 DIAGNOSIS — J441 Chronic obstructive pulmonary disease with (acute) exacerbation: Secondary | ICD-10-CM | POA: Diagnosis not present

## 2016-06-30 DIAGNOSIS — E7800 Pure hypercholesterolemia, unspecified: Secondary | ICD-10-CM | POA: Diagnosis not present

## 2016-06-30 LAB — HEMOGLOBIN A1C: HEMOGLOBIN A1C: 7.9

## 2016-07-05 DIAGNOSIS — E1165 Type 2 diabetes mellitus with hyperglycemia: Secondary | ICD-10-CM | POA: Diagnosis not present

## 2016-07-05 DIAGNOSIS — E7800 Pure hypercholesterolemia, unspecified: Secondary | ICD-10-CM | POA: Diagnosis not present

## 2016-07-05 DIAGNOSIS — G2581 Restless legs syndrome: Secondary | ICD-10-CM | POA: Diagnosis not present

## 2016-07-05 DIAGNOSIS — Z1389 Encounter for screening for other disorder: Secondary | ICD-10-CM | POA: Diagnosis not present

## 2016-07-06 ENCOUNTER — Encounter: Payer: Medicare Other | Attending: "Endocrinology | Admitting: Nutrition

## 2016-07-06 ENCOUNTER — Ambulatory Visit (INDEPENDENT_AMBULATORY_CARE_PROVIDER_SITE_OTHER): Payer: Medicare Other | Admitting: "Endocrinology

## 2016-07-06 ENCOUNTER — Encounter: Payer: Self-pay | Admitting: "Endocrinology

## 2016-07-06 VITALS — BP 147/84 | HR 80 | Ht 61.0 in | Wt 198.0 lb

## 2016-07-06 VITALS — Ht 60.0 in | Wt 198.0 lb

## 2016-07-06 DIAGNOSIS — I10 Essential (primary) hypertension: Secondary | ICD-10-CM | POA: Diagnosis not present

## 2016-07-06 DIAGNOSIS — E1159 Type 2 diabetes mellitus with other circulatory complications: Secondary | ICD-10-CM | POA: Insufficient documentation

## 2016-07-06 DIAGNOSIS — Z6838 Body mass index (BMI) 38.0-38.9, adult: Secondary | ICD-10-CM | POA: Insufficient documentation

## 2016-07-06 DIAGNOSIS — Z713 Dietary counseling and surveillance: Secondary | ICD-10-CM | POA: Insufficient documentation

## 2016-07-06 DIAGNOSIS — IMO0002 Reserved for concepts with insufficient information to code with codable children: Secondary | ICD-10-CM

## 2016-07-06 DIAGNOSIS — E782 Mixed hyperlipidemia: Secondary | ICD-10-CM

## 2016-07-06 DIAGNOSIS — E669 Obesity, unspecified: Secondary | ICD-10-CM

## 2016-07-06 DIAGNOSIS — E1169 Type 2 diabetes mellitus with other specified complication: Secondary | ICD-10-CM | POA: Diagnosis not present

## 2016-07-06 DIAGNOSIS — Z91199 Patient's noncompliance with other medical treatment and regimen due to unspecified reason: Secondary | ICD-10-CM

## 2016-07-06 DIAGNOSIS — E118 Type 2 diabetes mellitus with unspecified complications: Secondary | ICD-10-CM

## 2016-07-06 DIAGNOSIS — E1165 Type 2 diabetes mellitus with hyperglycemia: Secondary | ICD-10-CM

## 2016-07-06 DIAGNOSIS — Z9119 Patient's noncompliance with other medical treatment and regimen: Secondary | ICD-10-CM | POA: Diagnosis not present

## 2016-07-06 DIAGNOSIS — Z794 Long term (current) use of insulin: Secondary | ICD-10-CM

## 2016-07-06 MED ORDER — INSULIN GLARGINE 300 UNIT/ML ~~LOC~~ SOPN: 50.0000 [IU] | PEN_INJECTOR | Freq: Every day | SUBCUTANEOUS | 2 refills | Status: DC

## 2016-07-06 NOTE — Patient Instructions (Addendum)
Goals Cut out diet sodas Join stop smoking class Increase fresh fruit and vegetables. No snacks between meals Cut out processed foods Get A1C to 7%  Lose 1-2 lbs per week Cut out salty foods-pimento cheese, ham, cheese and processed foods.

## 2016-07-06 NOTE — Patient Instructions (Signed)

## 2016-07-06 NOTE — Progress Notes (Signed)
Medical Nutrition Therapy:  Appt start time: 1400end time:  1430  Assessment:  Primary concerns today: Diabetes and obesity.   A1C 7.9%, up from 7.6%.  Smokes 2 cigarrettes daily. Labs A1C 7.9%.  Lipids profile is better. Not exercising due to breathing issues and cold.   She has gained 14 lbs since last visit 3 months ago. She has chronic cough and has breathing issues. Will see Dr. Fransico HimNida today. Has been eating higher sodium foods, which may be contributing to weight gain. She does not appear to have swelling in her legs/feet upon looking. She does note she is congested from allergies and has harder time breathing from allergies. Complains of family stress and says that is why she is eating and smoking. Doesn't see a therapist right now. 40 units Toujeo at night.   Metformin 850 mg BID.  Victoria MaineGrandson is here with her today.  Needs better commitment to cutting out diet soda and high salt high fat foods and increasing fresh fruits and vegetables.  [facility] Encouraged her to seek therapist to address stress issues instead of smoking.        Meter 7 day avg 203 mg/dl, 14 day 213198  mg/dl and 30 day 086184 mg/dl. Her weight today is 198 lbs. Lipids are better: TCHO 132, TG 115, HDL 43, LDL 66.   Wt Readings from Last 3 Encounters:  07/06/16 198 lb (89.8 kg)  03/22/16 184 lb (83.5 kg)  03/22/16 184 lb (83.5 kg)   Ht Readings from Last 3 Encounters:  07/06/16 5' (1.524 m)  03/22/16 5\' 1"  (1.549 m)  03/22/16 5' (1.524 m)   Body mass index is 38.67 kg/m.     Preferred Learning Style:    No preference indicated   Learning Readiness:  Ready  Change in progress   MEDICATIONS:    DIETARY INTAKE:.    24-hr recall:  B ( AM):  Egg, fried [place]urkey ham, OR Raisin bran and 2% milk  Snk ( AM):  L ( PM): PImento cheese , CF Diet Mt dew D ( PM): MOnerys-Fajitas -1, chicken, onion, green peppers, Unswt tea,  Snk ( PM) Beverages:  Water  Usual physical  activity: ADL  Estimated energy needs: 1500 calories 170 g carbohydrates 112 g protein 42 g fat  Progress Towards Goal(s):  In progress.   Nutritional Diagnosis:  NB-1.1 Food and nutrition-related knowledge deficit As related to Diabetes.  As evidenced by A1C 9.3%..    Intervention:  Nutrition and Diabetes education provided on My Plate, CHO counting, meal planning, portion sizes, timing of meals, avoiding snacks between meals unless having a low blood sugar, target ranges for A1C and blood sugars, signs/symptoms and treatment of hyper/hypoglycemia, monitoring blood sugars, taking medications as prescribed, benefits of exercising 30 minutes per day and prevention of complications of DM.    1.Goals 1. Increase low carb vegetables. 2. Increase 30 minutes every day 3. Cut down on cheese, 4. Lose 2 lbs per month 5. Call to see about the smoking cessation program at [facility].   Teaching Method Utilized:  Visual Auditory Hands on  Handouts given during visit include:  The Plate Method   Meal Plan Card  Diabetes Instructions.   Barriers to learning/adherence to lifestyle change: None  Demonstrated degree of understanding via:  Teach Back   Monitoring/Evaluation:  Dietary intake, exercise, meal planning, SBG, and body weight in 1-3 month(s). Recommend to encourage smoking cessation classes and see a counselor to  address stress in her life rather than smoking or eating to improve overall health. Re-evaluate weight gain.

## 2016-07-06 NOTE — Progress Notes (Signed)
Subjective:    Patient ID: Victoria Lara, female    DOB: [birth year]. Patient is being seen in f/u for management of diabetes requested by  Juliette Alcide, MD  Past Medical History:  Diagnosis Date  . Anemia   . Anxiety   . Arthritis   . Bronchitis   . Complication of anesthesia    had to come back to ER after shoulder surgery at [facility]; hard to wake up; low O2 sats  . Depression   . Diabetes mellitus without complication (HCC)   . GERD (gastroesophageal reflux disease)   . Hyperlipidemia   . Hypertension   . Neuropathy (HCC)    Takes Gabapentin  . Pneumonia 2016  . Restless leg syndrome   . Seasonal allergies   . Shortness of breath dyspnea   . Syncope   . TIA (transient ischemic attack) 2006   Past Surgical History:  Procedure Laterality Date  . BREAST LUMPECTOMY Bilateral   . CATARACT EXTRACTION W/PHACO Right 08/20/2014   Procedure: CATARACT EXTRACTION PHACO AND INTRAOCULAR LENS PLACEMENT (IOC);  Surgeon: Gemma Payor, MD;  Location: [facility];  Service: Ophthalmology;  Laterality: Right;  CDE:5.31  . HAND SURGERY Bilateral   . NASAL SEPTOPLASTY W/ TURBINOPLASTY Bilateral 01/26/2016   Procedure: NASAL SEPTOPLASTY WITH BILATERAL TURBINATE REDUCTION;  Surgeon: Newman Pies, MD;  Location: [facility] OR;  Service: ENT;  Laterality: Bilateral;  . NASAL SEPTUM SURGERY  01/26/2016  . NASAL SINUS SURGERY Bilateral 01/26/2016   Procedure: BILATERAL ENDOSCOPIC MAXILLARY ANTROSTOMY;  Surgeon: Newman Pies, MD;  Location: [facility] OR;  Service: ENT;  Laterality: Bilateral;  . SHOULDER ARTHROSCOPY WITH ROTATOR CUFF REPAIR Right   . TUBAL LIGATION     Social History   Social History  . Marital status: Widowed    Spouse name: N/A  . Number of children: 1  . Years of education: 38   Occupational History  . Unemployed    Social History Main Topics  . Smoking status: Current Every Day Smoker    Packs/day: 0.50    Years: 41.00    Types: Cigarettes  . Smokeless tobacco: Never Used  . Alcohol use  No  . Drug use: No  . Sexual activity: Not Asked   Other Topics Concern  . None   Social History Narrative   Lives with boyfriend   Caffeine use: Drinks 1 soda a day    Outpatient Encounter Prescriptions as of 07/06/2016  Medication Sig  . acetaminophen (TYLENOL) 500 MG tablet Take 1,000 mg by mouth every 6 (six) hours as needed for mild pain.  Marland Kitchen albuterol (PROVENTIL) (2.5 MG/3ML) 0.083% nebulizer solution Take 3 mLs (2.5 mg total) by nebulization every 6 (six) hours as needed for wheezing.  Marland Kitchen amitriptyline (ELAVIL) 25 MG tablet Take 25 mg by mouth at bedtime.  Marland Kitchen amoxicillin (AMOXIL) 875 MG tablet Take 1 tablet (875 mg total) by mouth 2 (two) times daily.  Marland Kitchen atorvastatin (LIPITOR) 20 MG tablet TAKE ONE TABLET BY MOUTH DAILY  . Blood Glucose Monitoring Suppl (ACCU-CHEK AVIVA) device Use as instructed  . buPROPion (WELLBUTRIN SR) 150 MG 12 hr tablet Take 150 mg by mouth 2 (two) times daily.  . cetirizine (ZYRTEC) 10 MG tablet Take 10 mg by mouth daily.  . citalopram (CELEXA) 10 MG tablet Take 10 mg by mouth daily.  . clonazePAM (KLONOPIN) 0.5 MG tablet Take 0.25-0.5 mg by mouth every 8 (eight) hours as needed for anxiety. for anxiety  . ferrous sulfate 325 (65 FE)  MG tablet Take 325 mg by mouth 2 (two) times daily with a meal.  . gabapentin (NEURONTIN) 300 MG capsule Take 300 mg by mouth 2 (two) times daily.  Marland Kitchen glucose blood (ACCU-CHEK AVIVA) test strip To test 4 times a day  . glucose blood (ACCU-CHEK AVIVA) test strip Use 2 times a day to test glucose  . hydrochlorothiazide (MICROZIDE) 12.5 MG capsule Take 12.5 mg by mouth daily.  . Insulin Glargine 300 UNIT/ML SOPN Inject 50 Units into the skin at bedtime.  . Insulin Pen Needle (B-D ULTRAFINE III SHORT PEN) 31G X 8 MM MISC 1 each by Does not apply route as directed.  Marland Kitchen lisinopril (PRINIVIL,ZESTRIL) 20 MG tablet Take 20 mg by mouth daily.  . meloxicam (MOBIC) 15 MG tablet Take 15 mg by mouth daily.  . metFORMIN (GLUCOPHAGE) 850 MG  tablet Take 850 mg by mouth 2 (two) times daily with a meal.  . montelukast (SINGULAIR) 10 MG tablet Take 10 mg by mouth at bedtime.  Marland Kitchen omeprazole (PRILOSEC) 20 MG capsule Take 20 mg by mouth daily.  Marland Kitchen oxyCODONE-acetaminophen (PERCOCET/ROXICET) 5-325 MG tablet Take 1 tablet by mouth every 4 (four) hours as needed for severe pain.  Marland Kitchen PROAIR HFA 108 (90 Base) MCG/ACT inhaler Inhale 2 puffs into the lungs every 4 (four) hours as needed for wheezing or shortness of breath.   Marland Kitchen rOPINIRole (REQUIP) 0.25 MG tablet Take 0.25 mg by mouth every evening.   Marland Kitchen TOUJEO SOLOSTAR 300 UNIT/ML SOPN INJECT 40 UNITS SUBCUTANEOUSLY AT BEDTIME  . [DISCONTINUED] Insulin Glargine 300 UNIT/ML SOPN Inject 40 Units into the skin at bedtime.   No facility-administered encounter medications on file as of 07/06/2016.    ALLERGIES: No Known Allergies VACCINATION STATUS:  There is no immunization history on file for this patient.  Diabetes  She presents for her follow-up diabetic visit. She has type 2 diabetes mellitus. Onset time: She was diagnosed at approximate age of 61 years. Her disease course has been stable. There are no hypoglycemic associated symptoms. Pertinent negatives for hypoglycemia include no confusion, headaches, pallor or seizures. Associated symptoms include blurred vision and fatigue. Pertinent negatives for diabetes include no chest pain and no polyphagia. There are no hypoglycemic complications. Symptoms are stable. Diabetic complications include a CVA. (She is a chronic heavy smoker.) Risk factors for coronary artery disease include diabetes mellitus, dyslipidemia, hypertension, obesity, sedentary lifestyle and tobacco exposure. Current diabetic treatment includes oral agent (monotherapy). Her weight is increasing steadily. She is following a generally unhealthy diet. When asked about meal planning, she reported none. She rarely participates in exercise. Her breakfast blood glucose range is generally 180-200  mg/dl. Her overall blood glucose range is 180-200 mg/dl. An ACE inhibitor/angiotensin II receptor blocker is being taken. Eye exam is current.  Hyperlipidemia  This is a new diagnosis. The problem is uncontrolled. Recent lipid tests were reviewed and are high. Exacerbating diseases include diabetes and obesity. Pertinent negatives include no chest pain, myalgias or shortness of breath. She is currently on no antihyperlipidemic treatment. Risk factors for coronary artery disease include diabetes mellitus, dyslipidemia, hypertension and a sedentary lifestyle.  Hypertension  This is a chronic problem. The current episode started more than 1 year ago. Associated symptoms include blurred vision. Pertinent negatives include no chest pain, headaches, palpitations or shortness of breath. Risk factors for coronary artery disease include diabetes mellitus, dyslipidemia, sedentary lifestyle and smoking/tobacco exposure. Past treatments include ACE inhibitors. Hypertensive end-organ damage includes CVA.     Review of  Systems  Constitutional: Positive for fatigue. Negative for activity change, chills, fever and unexpected weight change.  HENT: Negative for trouble swallowing and voice change.   Eyes: Positive for blurred vision. Negative for visual disturbance.  Respiratory: Negative for cough, shortness of breath and wheezing.   Cardiovascular: Negative for chest pain, palpitations and leg swelling.  Gastrointestinal: Negative for diarrhea, nausea and vomiting.  Endocrine: Negative for cold intolerance, heat intolerance and polyphagia.  Genitourinary: Positive for frequency. Negative for dysuria and flank pain.  Musculoskeletal: Negative for arthralgias and myalgias.  Skin: Negative for color change, pallor, rash and wound.  Neurological: Negative for seizures and headaches.  Psychiatric/Behavioral: Negative for confusion, dysphoric mood and suicidal ideas.  All other systems reviewed and are  negative.   Objective:    BP (!) 147/84   Pulse 80   Ht 5\' 1"  (1.549 m)   Wt 198 lb (89.8 kg)   BMI 37.41 kg/m   Wt Readings from Last 3 Encounters:  07/06/16 198 lb (89.8 kg)  07/06/16 198 lb (89.8 kg)  03/22/16 184 lb (83.5 kg)    Physical Exam  Constitutional: She is oriented to person, place, and time. She appears well-developed.  HENT:  Head: Normocephalic and atraumatic.  Eyes: EOM are normal.  Neck: Normal range of motion. Neck supple. No tracheal deviation present. No thyromegaly present.  Cardiovascular: Normal rate and regular rhythm.   Pulmonary/Chest: Effort normal and breath sounds normal.  Abdominal: Soft. Bowel sounds are normal. There is no tenderness. There is no guarding.  Musculoskeletal: Normal range of motion. She exhibits no edema.  Neurological: She is alert and oriented to person, place, and time. She has normal reflexes. No cranial nerve deficit. Coordination normal.  Skin: Skin is warm and dry. No rash noted. No erythema. No pallor.  Psychiatric:  Hesitant affect.     CMP ( most recent) CMP     Component Value Date/Time   NA 135 01/18/2016 1630   NA 140 11/24/2015 1624   K 4.7 01/18/2016 1630   CL 98 (L) 01/18/2016 1630   CO2 27 01/18/2016 1630   GLUCOSE 245 (H) 01/18/2016 1630   BUN 10 01/18/2016 1630   BUN 24 11/24/2015 1624   CREATININE 0.63 01/18/2016 1630   CALCIUM 9.6 01/18/2016 1630   PROT 6.6 11/24/2015 1624   ALBUMIN 4.2 11/24/2015 1624   AST 25 11/24/2015 1624   ALT 24 11/24/2015 1624   ALKPHOS 79 11/24/2015 1624   BILITOT 0.4 11/24/2015 1624   GFRNONAA >60 01/18/2016 1630   GFRAA >60 01/18/2016 1630     Diabetic Labs (most recent): Lab Results  Component Value Date   HGBA1C 7.9 06/30/2016   HGBA1C 7.6 03/14/2016   HGBA1C 9.3 (H) 01/18/2016   Lipid Panel     Component Value Date/Time   CHOL 174 11/16/2015   TRIG 213 (A) 11/16/2015   HDL 32 (A) 11/16/2015   LDLCALC 99 11/16/2015       Assessment & Plan:    1. Uncontrolled type 2 diabetes mellitus with other circulatory complication, without long-term current use of insulin (HCC)   - Patient has currently uncontrolled symptomatic type 2 DM since  61 years of age. - Her recent A1c remains  stable at 7.9%,  generally improving from  10.2 %. Recent labs reviewed.   Her diabetes is complicated by TIA, chronic heavy smoking and patient remains at a high risk for more acute and chronic complications of diabetes which include CAD, CVA, CKD, retinopathy,  and neuropathy. These are all discussed in detail with the patient.  - I have counseled the patient on diet management and weight loss, by adopting a carbohydrate restricted/protein rich diet.  - Suggestion is made for patient to avoid simple carbohydrates   from their diet including Cakes , Desserts, Ice Cream,  Soda (  diet and regular) , Sweet Tea , Candies,  Chips, Cookies, Artificial Sweeteners,   and "Sugar-free" Products . This will help patient to have stable blood glucose profile and potentially avoid unintended weight gain.  - I encouraged the patient to switch to  unprocessed or minimally processed complex starch and increased protein intake (animal or plant source), fruits, and vegetables.  - Patient is advised to stick to a routine mealtimes to eat 3 meals  a day and avoid unnecessary snacks ( to snack only to correct hypoglycemia).  - The patient will be scheduled with Norm Salt, RDN, CDE for individualized DM education.  - I have approached patient with the following individualized plan to manage diabetes and patient agrees:    - I  will increase her basal  insulin  Toujeo to  50 units daily at bedtime, associated with monitoring of blood glucose before breakfast and at bedtime.  -Patient is encouraged to call clinic for blood glucose levels less than 70 or above 200 mg /dl.  - I will continue metformin 850 mg by mouth twice a day, therapeutically suitable for patient.  -  Patient will be considered for incretin therapy as appropriate next visit. - Patient specific target  A1c;  LDL, HDL, Triglycerides, and  Waist Circumference were discussed in detail.  2) BP/HTN: Controlled. Continue current medications including ACEI/ARB. 3) Lipids/HPL:  Uncontrolled, triglycerides 213, I have added atorvastatin 20 mg by mouth daily at bedtime.  4)  Weight/Diet:   She has gained 14 pounds since last visit daily due to dietary indiscretion, CDE Consult  Is in progress  , exercise, and detailed carbohydrates information provided.  5) Chronic Care/Health Maintenance:  -Patient is on ACEI and Statin medications and encouraged to continue to follow up with Ophthalmology, Podiatrist at least yearly or according to recommendations, and advised to  quit smoking. I have recommended yearly flu vaccine and pneumonia vaccination at least every 5 years; moderate intensity exercise for up to 150 minutes weekly; and  sleep for at least 7 hours a day.  - 25 minutes of time was spent on the care of this patient , 50% of which was applied for counseling on diabetes complications and their preventions.  - Patient to bring meter and  blood glucose logs during their next visit.   - I advised patient to maintain close follow up with Juliette Alcide, MD for primary care needs.  Follow up plan: - Return in about 3 months (around 10/04/2016) for meter, and logs.  Marquis Lunch, MD Phone: [phone]  Fax: [phone]   07/06/2016, 3:18 PM

## 2016-07-10 DIAGNOSIS — J449 Chronic obstructive pulmonary disease, unspecified: Secondary | ICD-10-CM | POA: Diagnosis not present

## 2016-08-10 DIAGNOSIS — J449 Chronic obstructive pulmonary disease, unspecified: Secondary | ICD-10-CM | POA: Diagnosis not present

## 2016-08-29 ENCOUNTER — Other Ambulatory Visit: Payer: Self-pay

## 2016-08-29 MED ORDER — INSULIN GLARGINE 300 UNIT/ML ~~LOC~~ SOPN: 50.0000 [IU] | PEN_INJECTOR | Freq: Every day | SUBCUTANEOUS | 2 refills | Status: DC

## 2016-08-29 MED ORDER — INSULIN GLARGINE 300 UNIT/ML ~~LOC~~ SOPN
50.0000 [IU] | PEN_INJECTOR | Freq: Every day | SUBCUTANEOUS | 2 refills | Status: DC
Start: 2016-08-29 — End: 2016-10-05

## 2016-09-07 ENCOUNTER — Ambulatory Visit: Payer: Self-pay | Admitting: Neurology

## 2016-09-07 DIAGNOSIS — J449 Chronic obstructive pulmonary disease, unspecified: Secondary | ICD-10-CM | POA: Diagnosis not present

## 2016-09-29 DIAGNOSIS — E1165 Type 2 diabetes mellitus with hyperglycemia: Secondary | ICD-10-CM | POA: Diagnosis not present

## 2016-09-29 LAB — HEMOGLOBIN A1C: HEMOGLOBIN A1C: 7.9

## 2016-09-30 DIAGNOSIS — M79672 Pain in left foot: Secondary | ICD-10-CM | POA: Diagnosis not present

## 2016-09-30 DIAGNOSIS — S92355A Nondisplaced fracture of fifth metatarsal bone, left foot, initial encounter for closed fracture: Secondary | ICD-10-CM | POA: Diagnosis not present

## 2016-10-05 ENCOUNTER — Ambulatory Visit: Payer: Self-pay | Admitting: "Endocrinology

## 2016-10-05 ENCOUNTER — Ambulatory Visit: Payer: Self-pay | Admitting: Nutrition

## 2016-10-05 ENCOUNTER — Encounter: Payer: Self-pay | Admitting: "Endocrinology

## 2016-10-05 ENCOUNTER — Ambulatory Visit (INDEPENDENT_AMBULATORY_CARE_PROVIDER_SITE_OTHER): Payer: Medicare Other | Admitting: "Endocrinology

## 2016-10-05 VITALS — BP 113/75 | HR 90 | Ht 61.0 in | Wt 203.0 lb

## 2016-10-05 DIAGNOSIS — E782 Mixed hyperlipidemia: Secondary | ICD-10-CM

## 2016-10-05 DIAGNOSIS — E1169 Type 2 diabetes mellitus with other specified complication: Secondary | ICD-10-CM | POA: Diagnosis not present

## 2016-10-05 DIAGNOSIS — I10 Essential (primary) hypertension: Secondary | ICD-10-CM

## 2016-10-05 DIAGNOSIS — F17200 Nicotine dependence, unspecified, uncomplicated: Secondary | ICD-10-CM

## 2016-10-05 DIAGNOSIS — E669 Obesity, unspecified: Secondary | ICD-10-CM | POA: Diagnosis not present

## 2016-10-05 MED ORDER — INSULIN GLARGINE 300 UNIT/ML ~~LOC~~ SOPN: 60.0000 [IU] | PEN_INJECTOR | Freq: Every day | SUBCUTANEOUS | 2 refills | Status: DC

## 2016-10-05 NOTE — Patient Instructions (Signed)

## 2016-10-05 NOTE — Progress Notes (Signed)
Subjective:    Patient ID: Victoria Lara, female    DOB: [birth year]. Patient is being seen in f/u for management of diabetes requested by  Juliette Alcide, MD  Past Medical History:  Diagnosis Date  . Anemia   . Anxiety   . Arthritis   . Bronchitis   . Complication of anesthesia    had to come back to ER after shoulder surgery at [facility]; hard to wake up; low O2 sats  . Depression   . Diabetes mellitus without complication (HCC)   . GERD (gastroesophageal reflux disease)   . Hyperlipidemia   . Hypertension   . Neuropathy (HCC)    Takes Gabapentin  . Pneumonia 2016  . Restless leg syndrome   . Seasonal allergies   . Shortness of breath dyspnea   . Syncope   . TIA (transient ischemic attack) 2006   Past Surgical History:  Procedure Laterality Date  . BREAST LUMPECTOMY Bilateral   . CATARACT EXTRACTION W/PHACO Right 08/20/2014   Procedure: CATARACT EXTRACTION PHACO AND INTRAOCULAR LENS PLACEMENT (IOC);  Surgeon: Gemma Payor, MD;  Location: [facility];  Service: Ophthalmology;  Laterality: Right;  CDE:5.31  . HAND SURGERY Bilateral   . NASAL SEPTOPLASTY W/ TURBINOPLASTY Bilateral 01/26/2016   Procedure: NASAL SEPTOPLASTY WITH BILATERAL TURBINATE REDUCTION;  Surgeon: Newman Pies, MD;  Location: [facility];  Service: ENT;  Laterality: Bilateral;  . NASAL SEPTUM SURGERY  01/26/2016  . NASAL SINUS SURGERY Bilateral 01/26/2016   Procedure: BILATERAL ENDOSCOPIC MAXILLARY ANTROSTOMY;  Surgeon: Newman Pies, MD;  Location: [facility];  Service: ENT;  Laterality: Bilateral;  . SHOULDER ARTHROSCOPY WITH ROTATOR CUFF REPAIR Right   . TUBAL LIGATION     Social History   Social History  . Marital status: Widowed    Spouse name: N/A  . Number of children: 1  . Years of education: 69   Occupational History  . Unemployed    Social History Main Topics  . Smoking status: Current Every Day Smoker    Packs/day: 0.50    Years: 41.00    Types: Cigarettes  . Smokeless tobacco: Never Used  . Alcohol use  No  . Drug use: No  . Sexual activity: Not on file   Other Topics Concern  . Not on file   Social History Narrative   Lives with boyfriend   Caffeine use: Drinks 1 soda a day    Outpatient Encounter Prescriptions as of 10/05/2016  Medication Sig  . acetaminophen (TYLENOL) 500 MG tablet Take 1,000 mg by mouth every 6 (six) hours as needed for mild pain.  Marland Kitchen albuterol (PROVENTIL) (2.5 MG/3ML) 0.083% nebulizer solution Take 3 mLs (2.5 mg total) by nebulization every 6 (six) hours as needed for wheezing.  Marland Kitchen amitriptyline (ELAVIL) 25 MG tablet Take 25 mg by mouth at bedtime.  Marland Kitchen amoxicillin (AMOXIL) 875 MG tablet Take 1 tablet (875 mg total) by mouth 2 (two) times daily.  Marland Kitchen atorvastatin (LIPITOR) 20 MG tablet TAKE ONE TABLET BY MOUTH DAILY  . Blood Glucose Monitoring Suppl (ACCU-CHEK AVIVA) device Use as instructed  . buPROPion (WELLBUTRIN SR) 150 MG 12 hr tablet Take 150 mg by mouth 2 (two) times daily.  . cetirizine (ZYRTEC) 10 MG tablet Take 10 mg by mouth daily.  . citalopram (CELEXA) 10 MG tablet Take 10 mg by mouth daily.  . clonazePAM (KLONOPIN) 0.5 MG tablet Take 0.25-0.5 mg by mouth every 8 (eight) hours as needed for anxiety. for anxiety  . ferrous sulfate  325 (65 FE) MG tablet Take 325 mg by mouth 2 (two) times daily with a meal.  . gabapentin (NEURONTIN) 300 MG capsule Take 300 mg by mouth 2 (two) times daily.  Marland Kitchen glucose blood (ACCU-CHEK AVIVA) test strip To test 4 times a day  . hydrochlorothiazide (MICROZIDE) 12.5 MG capsule Take 12.5 mg by mouth daily.  . Insulin Glargine (TOUJEO SOLOSTAR) 300 UNIT/ML SOPN Inject 50 Units into the skin at bedtime.  . Insulin Glargine 300 UNIT/ML SOPN Inject 60 Units into the skin at bedtime.  . Insulin Pen Needle (B-D ULTRAFINE III SHORT PEN) 31G X 8 MM MISC 1 each by Does not apply route as directed.  Marland Kitchen lisinopril (PRINIVIL,ZESTRIL) 20 MG tablet Take 20 mg by mouth daily.  . meloxicam (MOBIC) 15 MG tablet Take 15 mg by mouth daily.  .  metFORMIN (GLUCOPHAGE) 850 MG tablet Take 850 mg by mouth 2 (two) times daily with a meal.  . montelukast (SINGULAIR) 10 MG tablet Take 10 mg by mouth at bedtime.  Marland Kitchen omeprazole (PRILOSEC) 20 MG capsule Take 20 mg by mouth daily.  Marland Kitchen PROAIR HFA 108 (90 Base) MCG/ACT inhaler Inhale 2 puffs into the lungs every 4 (four) hours as needed for wheezing or shortness of breath.   Marland Kitchen rOPINIRole (REQUIP) 0.25 MG tablet Take 0.25 mg by mouth every evening.   . [DISCONTINUED] glucose blood (ACCU-CHEK AVIVA) test strip Use 2 times a day to test glucose  . [DISCONTINUED] Insulin Glargine 300 UNIT/ML SOPN Inject 50 Units into the skin at bedtime.  . [DISCONTINUED] oxyCODONE-acetaminophen (PERCOCET/ROXICET) 5-325 MG tablet Take 1 tablet by mouth every 4 (four) hours as needed for severe pain.   No facility-administered encounter medications on file as of 10/05/2016.    ALLERGIES: No Known Allergies VACCINATION STATUS:  There is no immunization history on file for this patient.  Diabetes  She presents for her follow-up diabetic visit. She has type 2 diabetes mellitus. Onset time: She was diagnosed at approximate age of 44 years. Her disease course has been stable. There are no hypoglycemic associated symptoms. Pertinent negatives for hypoglycemia include no confusion, headaches, pallor or seizures. Associated symptoms include blurred vision and fatigue. Pertinent negatives for diabetes include no chest pain and no polyphagia. There are no hypoglycemic complications. Symptoms are stable. Diabetic complications include a CVA. (She is a chronic heavy smoker.) Risk factors for coronary artery disease include diabetes mellitus, dyslipidemia, hypertension, obesity, sedentary lifestyle and tobacco exposure. Current diabetic treatment includes oral agent (monotherapy). Her weight is increasing steadily. She is following a generally unhealthy diet. When asked about meal planning, she reported none. She rarely participates in  exercise. Her breakfast blood glucose range is generally 180-200 mg/dl. Her overall blood glucose range is 180-200 mg/dl. An ACE inhibitor/angiotensin II receptor blocker is being taken. Eye exam is current.  Hyperlipidemia  This is a new diagnosis. The problem is uncontrolled. Recent lipid tests were reviewed and are high. Exacerbating diseases include diabetes and obesity. Pertinent negatives include no chest pain, myalgias or shortness of breath. She is currently on no antihyperlipidemic treatment. Risk factors for coronary artery disease include diabetes mellitus, dyslipidemia, hypertension and a sedentary lifestyle.  Hypertension  This is a chronic problem. The current episode started more than 1 year ago. Associated symptoms include blurred vision. Pertinent negatives include no chest pain, headaches, palpitations or shortness of breath. Risk factors for coronary artery disease include diabetes mellitus, dyslipidemia, sedentary lifestyle and smoking/tobacco exposure. Past treatments include ACE inhibitors. Hypertensive end-organ  damage includes CVA.     Review of Systems  Constitutional: Positive for fatigue. Negative for activity change, chills, fever and unexpected weight change.  HENT: Negative for trouble swallowing and voice change.   Eyes: Positive for blurred vision. Negative for visual disturbance.  Respiratory: Negative for cough, shortness of breath and wheezing.   Cardiovascular: Negative for chest pain, palpitations and leg swelling.  Gastrointestinal: Negative for diarrhea, nausea and vomiting.  Endocrine: Negative for cold intolerance, heat intolerance and polyphagia.  Genitourinary: Positive for frequency. Negative for dysuria and flank pain.  Musculoskeletal: Negative for arthralgias and myalgias.  Skin: Negative for color change, pallor, rash and wound.  Neurological: Negative for seizures and headaches.  Psychiatric/Behavioral: Negative for confusion, dysphoric mood and  suicidal ideas.  All other systems reviewed and are negative.   Objective:    BP 113/75   Pulse 90   Ht  (1.549 m)   Wt 203 lb (92.1 kg)   BMI 38.36 kg/m   Wt Readings from Last 3 Encounters:  10/05/16 203 lb (92.1 kg)  07/06/16 198 lb (89.8 kg)  07/06/16 198 lb (89.8 kg)    Physical Exam  Constitutional: She is oriented to person, place, and time. She appears well-developed.  HENT:  Head: Normocephalic and atraumatic.  Eyes: EOM are normal.  Neck: Normal range of motion. Neck supple. No tracheal deviation present. No thyromegaly present.  Cardiovascular: Normal rate and regular rhythm.   Pulmonary/Chest: Effort normal and breath sounds normal.  Abdominal: Soft. Bowel sounds are normal. There is no tenderness. There is no guarding.  Musculoskeletal: Normal range of motion. She exhibits no edema.  Neurological: She is alert and oriented to person, place, and time. She has normal reflexes. No cranial nerve deficit. Coordination normal.  Skin: Skin is warm and dry. No rash noted. No erythema. No pallor.  Psychiatric:  Hesitant affect.     CMP ( most recent) CMP     Component Value Date/Time   NA 135 01/18/2016 1630   NA 140 11/24/2015 1624   K 4.7 01/18/2016 1630   CL 98 (L) 01/18/2016 1630   CO2 27 01/18/2016 1630   GLUCOSE 245 (H) 01/18/2016 1630   BUN 10 01/18/2016 1630   BUN 24 11/24/2015 1624   CREATININE 0.63 01/18/2016 1630   CALCIUM 9.6 01/18/2016 1630   PROT 6.6 11/24/2015 1624   ALBUMIN 4.2 11/24/2015 1624   AST 25 11/24/2015 1624   ALT 24 11/24/2015 1624   ALKPHOS 79 11/24/2015 1624   BILITOT 0.4 11/24/2015 1624   GFRNONAA >60 01/18/2016 1630   GFRAA >60 01/18/2016 1630     Diabetic Labs (most recent): Lab Results  Component Value Date   HGBA1C 7.9 09/29/2016   HGBA1C 7.9 06/30/2016   HGBA1C 7.6 03/14/2016   Lipid Panel     Component Value Date/Time   CHOL 174 11/16/2015   TRIG 213 (A) 11/16/2015   HDL 32 (A) 11/16/2015   LDLCALC 99  11/16/2015       Assessment & Plan:   1. Uncontrolled type 2 diabetes mellitus with other circulatory complication, without long-term current use of insulin (HCC)   - Patient has currently uncontrolled symptomatic type 2 DM since  61 years of age. - Her recent A1c remains  stable at 7.9%,  generally improving from  10.2 %. Recent labs reviewed.   Her diabetes is complicated by TIA, chronic heavy smoking and patient remains at a high risk for more acute and chronic complications of  diabetes which include CAD, CVA, CKD, retinopathy, and neuropathy. These are all discussed in detail with the patient.  - I have counseled the patient on diet management and weight loss, by adopting a carbohydrate restricted/protein rich diet.  - Suggestion is made for patient to avoid simple carbohydrates   from their diet including Cakes , Desserts, Ice Cream,  Soda (  diet and regular) , Sweet Tea , Candies,  Chips, Cookies, Artificial Sweeteners,   and "Sugar-free" Products . This will help patient to have stable blood glucose profile and potentially avoid unintended weight gain.  - I encouraged the patient to switch to  unprocessed or minimally processed complex starch and increased protein intake (animal or plant source), fruits, and vegetables.  - Patient is advised to stick to a routine mealtimes to eat 3 meals  a day and avoid unnecessary snacks ( to snack only to correct hypoglycemia).  - The patient will be scheduled with Norm Salt, RDN, CDE for individualized DM education.  - I have approached patient with the following individualized plan to manage diabetes and patient agrees:    - I  will increase her basal  insulin  Toujeo to  60 units daily at bedtime, associated with monitoring of blood glucose before breakfast and at bedtime.  -Patient is encouraged to call clinic for blood glucose levels less than 70 or above 200 mg /dl.  - I will continue metformin 850 mg by mouth twice a day,  therapeutically suitable for patient.  - Patient will be considered for incretin therapy as appropriate next visit. - Patient specific target  A1c;  LDL, HDL, Triglycerides, and  Waist Circumference were discussed in detail.  2) BP/HTN: Controlled. Continue current medications including ACEI/ARB. 3) Lipids/HPL:  Uncontrolled, triglycerides 213, I have added atorvastatin 20 mg by mouth daily at bedtime.  4)  Weight/Diet:   She has gained 20 pounds  overall,  due to dietary indiscretion, CDE Consult  Is in progress  , exercise, and detailed carbohydrates information provided.  5) Chronic Care/Health Maintenance:  -Patient is on ACEI and Statin medications and encouraged to continue to follow up with Ophthalmology, Podiatrist at least yearly or according to recommendations, and advised to  quit smoking. I have recommended yearly flu vaccine and pneumonia vaccination at least every 5 years; moderate intensity exercise for up to 150 minutes weekly; and  sleep for at least 7 hours a day.  - 25 minutes of time was spent on the care of this patient , 50% of which was applied for counseling on diabetes complications and their preventions.  - Patient to bring meter and  blood glucose logs during their next visit.   - I advised patient to maintain close follow up with Juliette Alcide, MD for primary care needs.  Follow up plan: - Return in about 3 months (around 01/04/2017) for follow up with pre-visit labs, meter, and logs.  Marquis Lunch, MD Phone: [phone]  Fax: [phone]   10/05/2016, 11:18 AM

## 2016-10-08 DIAGNOSIS — J449 Chronic obstructive pulmonary disease, unspecified: Secondary | ICD-10-CM | POA: Diagnosis not present

## 2016-10-11 DIAGNOSIS — S92355A Nondisplaced fracture of fifth metatarsal bone, left foot, initial encounter for closed fracture: Secondary | ICD-10-CM | POA: Diagnosis not present

## 2016-10-21 ENCOUNTER — Other Ambulatory Visit: Payer: Self-pay | Admitting: "Endocrinology

## 2016-11-07 DIAGNOSIS — J449 Chronic obstructive pulmonary disease, unspecified: Secondary | ICD-10-CM | POA: Diagnosis not present

## 2016-12-08 DIAGNOSIS — J449 Chronic obstructive pulmonary disease, unspecified: Secondary | ICD-10-CM | POA: Diagnosis not present

## 2016-12-15 ENCOUNTER — Telehealth: Payer: Self-pay | Admitting: "Endocrinology

## 2016-12-15 NOTE — Telephone Encounter (Signed)
Victoria PhilipsGeraldine is asking for a refill on her Toujeo 300 unit/ml, please advise

## 2016-12-18 MED ORDER — INSULIN GLARGINE 300 UNIT/ML ~~LOC~~ SOPN: 60.0000 [IU] | PEN_INJECTOR | Freq: Every day | SUBCUTANEOUS | 2 refills | Status: DC

## 2017-01-05 DIAGNOSIS — E1165 Type 2 diabetes mellitus with hyperglycemia: Secondary | ICD-10-CM | POA: Diagnosis not present

## 2017-01-05 DIAGNOSIS — E7800 Pure hypercholesterolemia, unspecified: Secondary | ICD-10-CM | POA: Diagnosis not present

## 2017-01-05 DIAGNOSIS — G4733 Obstructive sleep apnea (adult) (pediatric): Secondary | ICD-10-CM | POA: Diagnosis not present

## 2017-01-05 LAB — BASIC METABOLIC PANEL
BUN: 13 (ref 4–21)
Creatinine: 0.8 (ref <=1.1)

## 2017-01-05 LAB — HEMOGLOBIN A1C: Hemoglobin A1C: 7.7

## 2017-01-05 LAB — LIPID PANEL
CHOLESTEROL: 148 (ref 0–200)
HDL: 36 (ref 35–70)
LDL Cholesterol: 75
TRIGLYCERIDES: 184 — AB (ref 40–160)

## 2017-01-07 DIAGNOSIS — J449 Chronic obstructive pulmonary disease, unspecified: Secondary | ICD-10-CM | POA: Diagnosis not present

## 2017-01-08 ENCOUNTER — Ambulatory Visit: Payer: Self-pay | Admitting: "Endocrinology

## 2017-01-08 DIAGNOSIS — E7800 Pure hypercholesterolemia, unspecified: Secondary | ICD-10-CM | POA: Diagnosis not present

## 2017-01-08 DIAGNOSIS — E1165 Type 2 diabetes mellitus with hyperglycemia: Secondary | ICD-10-CM | POA: Diagnosis not present

## 2017-01-08 DIAGNOSIS — G4733 Obstructive sleep apnea (adult) (pediatric): Secondary | ICD-10-CM | POA: Diagnosis not present

## 2017-01-08 DIAGNOSIS — Z720 Tobacco use: Secondary | ICD-10-CM | POA: Diagnosis not present

## 2017-01-30 ENCOUNTER — Encounter: Payer: Self-pay | Admitting: "Endocrinology

## 2017-01-30 ENCOUNTER — Ambulatory Visit (INDEPENDENT_AMBULATORY_CARE_PROVIDER_SITE_OTHER): Payer: Medicare Other | Admitting: "Endocrinology

## 2017-01-30 VITALS — BP 118/75 | HR 80 | Wt 205.0 lb

## 2017-01-30 DIAGNOSIS — I10 Essential (primary) hypertension: Secondary | ICD-10-CM

## 2017-01-30 DIAGNOSIS — F17200 Nicotine dependence, unspecified, uncomplicated: Secondary | ICD-10-CM | POA: Diagnosis not present

## 2017-01-30 DIAGNOSIS — E669 Obesity, unspecified: Secondary | ICD-10-CM | POA: Diagnosis not present

## 2017-01-30 DIAGNOSIS — E1169 Type 2 diabetes mellitus with other specified complication: Secondary | ICD-10-CM

## 2017-01-30 DIAGNOSIS — E782 Mixed hyperlipidemia: Secondary | ICD-10-CM

## 2017-01-30 NOTE — Patient Instructions (Signed)

## 2017-01-30 NOTE — Progress Notes (Signed)
Subjective:    Patient ID: Victoria Lara, female    DOB: [birth year]. Patient is being seen in f/u for management of diabetes requested by  Juliette AlcideBurdine, Steven E, MD  Past Medical History:  Diagnosis Date  . Anemia   . Anxiety   . Arthritis   . Bronchitis   . Complication of anesthesia    had to come back to ER after shoulder surgery at [facility]; hard to wake up; low O2 sats  . Depression   . Diabetes mellitus without complication (HCC)   . GERD (gastroesophageal reflux disease)   . Hyperlipidemia   . Hypertension   . Neuropathy    Takes Gabapentin  . Pneumonia 2016  . Restless leg syndrome   . Seasonal allergies   . Shortness of breath dyspnea   . Syncope   . TIA (transient ischemic attack) 2006   Past Surgical History:  Procedure Laterality Date  . BREAST LUMPECTOMY Bilateral   . CATARACT EXTRACTION W/PHACO Right 08/20/2014   Procedure: CATARACT EXTRACTION PHACO AND INTRAOCULAR LENS PLACEMENT (IOC);  Surgeon: Gemma PayorKerry Hunt, MD;  Location: [facility];  Service: Ophthalmology;  Laterality: Right;  CDE:5.31  . HAND SURGERY Bilateral   . NASAL SEPTOPLASTY W/ TURBINOPLASTY Bilateral 01/26/2016   Procedure: NASAL SEPTOPLASTY WITH BILATERAL TURBINATE REDUCTION;  Surgeon: Newman PiesSu Teoh, MD;  Location: [facility];  Service: ENT;  Laterality: Bilateral;  . NASAL SEPTUM SURGERY  01/26/2016  . NASAL SINUS SURGERY Bilateral 01/26/2016   Procedure: BILATERAL ENDOSCOPIC MAXILLARY ANTROSTOMY;  Surgeon: Newman PiesSu Teoh, MD;  Location: [facility];  Service: ENT;  Laterality: Bilateral;  . SHOULDER ARTHROSCOPY WITH ROTATOR CUFF REPAIR Right   . TUBAL LIGATION     Social History   Social History  . Marital status: Widowed    Spouse name: N/A  . Number of children: 1  . Years of education: 2112   Occupational History  . Unemployed    Social History Main Topics  . Smoking status: Current Every Day Smoker    Packs/day: 0.50    Years: 41.00    Types: Cigarettes  . Smokeless tobacco: Never Used  . Alcohol use No   . Drug use: No  . Sexual activity: Not Asked   Other Topics Concern  . None   Social History Narrative   Lives with boyfriend   Caffeine use: Drinks 1 soda a day    Outpatient Encounter Prescriptions as of 01/30/2017  Medication Sig  . acetaminophen (TYLENOL) 500 MG tablet Take 1,000 mg by mouth every 6 (six) hours as needed for mild pain.  Marland Kitchen. albuterol (PROVENTIL) (2.5 MG/3ML) 0.083% nebulizer solution Take 3 mLs (2.5 mg total) by nebulization every 6 (six) hours as needed for wheezing.  Marland Kitchen. amitriptyline (ELAVIL) 25 MG tablet Take 25 mg by mouth at bedtime.  Marland Kitchen. amoxicillin (AMOXIL) 875 MG tablet Take 1 tablet (875 mg total) by mouth 2 (two) times daily.  Marland Kitchen. atorvastatin (LIPITOR) 20 MG tablet TAKE ONE TABLET BY MOUTH DAILY  . Blood Glucose Monitoring Suppl (ACCU-CHEK AVIVA) device Use as instructed  . buPROPion (WELLBUTRIN SR) 150 MG 12 hr tablet Take 150 mg by mouth 2 (two) times daily.  . cetirizine (ZYRTEC) 10 MG tablet Take 10 mg by mouth daily.  . citalopram (CELEXA) 10 MG tablet Take 10 mg by mouth daily.  . clonazePAM (KLONOPIN) 0.5 MG tablet Take 0.25-0.5 mg by mouth every 8 (eight) hours as needed for anxiety. for anxiety  . ferrous sulfate 325 (65 FE)  MG tablet Take 325 mg by mouth 2 (two) times daily with a meal.  . gabapentin (NEURONTIN) 300 MG capsule Take 300 mg by mouth 2 (two) times daily.  Marland Kitchen glucose blood (ACCU-CHEK AVIVA) test strip To test 4 times a day  . hydrochlorothiazide (MICROZIDE) 12.5 MG capsule Take 12.5 mg by mouth daily.  . Insulin Glargine (TOUJEO SOLOSTAR) 300 UNIT/ML SOPN Inject 60 Units into the skin at bedtime.  . Insulin Pen Needle (B-D ULTRAFINE III SHORT PEN) 31G X 8 MM MISC 1 each by Does not apply route as directed.  Marland Kitchen lisinopril (PRINIVIL,ZESTRIL) 20 MG tablet Take 20 mg by mouth daily.  . meloxicam (MOBIC) 15 MG tablet Take 15 mg by mouth daily.  . metFORMIN (GLUCOPHAGE) 850 MG tablet Take 850 mg by mouth 2 (two) times daily with a meal.  .  montelukast (SINGULAIR) 10 MG tablet Take 10 mg by mouth at bedtime.  Marland Kitchen omeprazole (PRILOSEC) 20 MG capsule Take 20 mg by mouth daily.  Marland Kitchen PROAIR HFA 108 (90 Base) MCG/ACT inhaler Inhale 2 puffs into the lungs every 4 (four) hours as needed for wheezing or shortness of breath.   Marland Kitchen rOPINIRole (REQUIP) 0.25 MG tablet Take 0.25 mg by mouth every evening.    No facility-administered encounter medications on file as of 01/30/2017.    ALLERGIES: No Known Allergies VACCINATION STATUS:  There is no immunization history on file for this patient.  Diabetes  She presents for her follow-up diabetic visit. She has type 2 diabetes mellitus. Onset time: She was diagnosed at approximate age of 44 years. Her disease course has been improving. There are no hypoglycemic associated symptoms. Pertinent negatives for hypoglycemia include no confusion, headaches, pallor or seizures. Associated symptoms include blurred vision and fatigue. Pertinent negatives for diabetes include no chest pain and no polyphagia. There are no hypoglycemic complications. Symptoms are improving. Diabetic complications include a CVA. (She is a chronic heavy smoker.) Risk factors for coronary artery disease include diabetes mellitus, dyslipidemia, hypertension, obesity, sedentary lifestyle and tobacco exposure. Current diabetic treatment includes oral agent (monotherapy). Her weight is stable. She is following a generally unhealthy diet. When asked about meal planning, she reported none. She rarely participates in exercise. Her breakfast blood glucose range is generally 140-180 mg/dl. Her overall blood glucose range is 140-180 mg/dl. An ACE inhibitor/angiotensin II receptor blocker is being taken. Eye exam is current.  Hyperlipidemia  This is a new diagnosis. The problem is uncontrolled. Recent lipid tests were reviewed and are high. Exacerbating diseases include diabetes and obesity. Pertinent negatives include no chest pain, myalgias or shortness  of breath. She is currently on no antihyperlipidemic treatment. Risk factors for coronary artery disease include diabetes mellitus, dyslipidemia, hypertension and a sedentary lifestyle.  Hypertension  This is a chronic problem. The current episode started more than 1 year ago. Associated symptoms include blurred vision. Pertinent negatives include no chest pain, headaches, palpitations or shortness of breath. Risk factors for coronary artery disease include diabetes mellitus, dyslipidemia, sedentary lifestyle and smoking/tobacco exposure. Past treatments include ACE inhibitors. Hypertensive end-organ damage includes CVA.     Review of Systems  Constitutional: Positive for fatigue. Negative for activity change, chills, fever and unexpected weight change.  HENT: Negative for trouble swallowing and voice change.   Eyes: Positive for blurred vision. Negative for visual disturbance.  Respiratory: Negative for cough, shortness of breath and wheezing.   Cardiovascular: Negative for chest pain, palpitations and leg swelling.  Gastrointestinal: Negative for diarrhea, nausea and vomiting.  Endocrine: Negative for cold intolerance, heat intolerance and polyphagia.  Genitourinary: Positive for frequency. Negative for dysuria and flank pain.  Musculoskeletal: Negative for arthralgias and myalgias.  Skin: Negative for color change, pallor, rash and wound.  Neurological: Negative for seizures and headaches.  Psychiatric/Behavioral: Negative for confusion, dysphoric mood and suicidal ideas.  All other systems reviewed and are negative.   Objective:    Wt 205 lb (93 kg)   BMI 38.73 kg/m   Wt Readings from Last 3 Encounters:  01/30/17 205 lb (93 kg)  10/05/16 203 lb (92.1 kg)  07/06/16 198 lb (89.8 kg)    Physical Exam  Constitutional: She is oriented to person, place, and time. She appears well-developed.  HENT:  Head: Normocephalic and atraumatic.  Eyes: EOM are normal.  Neck: Normal range of  motion. Neck supple. No tracheal deviation present. No thyromegaly present.  Cardiovascular: Normal rate and regular rhythm.   Pulmonary/Chest: Effort normal and breath sounds normal.  Abdominal: Soft. Bowel sounds are normal. There is no tenderness. There is no guarding.  Musculoskeletal: Normal range of motion. She exhibits no edema.  Neurological: She is alert and oriented to person, place, and time. She has normal reflexes. No cranial nerve deficit. Coordination normal.  Skin: Skin is warm and dry. No rash noted. No erythema. No pallor.  Psychiatric:  Hesitant affect.     CMP ( most recent) CMP     Component Value Date/Time   NA 135 01/18/2016 1630   NA 140 11/24/2015 1624   K 4.7 01/18/2016 1630   CL 98 (L) 01/18/2016 1630   CO2 27 01/18/2016 1630   GLUCOSE 245 (H) 01/18/2016 1630   BUN 13 01/05/2017   CREATININE 0.8 01/05/2017   CREATININE 0.63 01/18/2016 1630   CALCIUM 9.6 01/18/2016 1630   PROT 6.6 11/24/2015 1624   ALBUMIN 4.2 11/24/2015 1624   AST 25 11/24/2015 1624   ALT 24 11/24/2015 1624   ALKPHOS 79 11/24/2015 1624   BILITOT 0.4 11/24/2015 1624   GFRNONAA >60 01/18/2016 1630   GFRAA >60 01/18/2016 1630     Diabetic Labs (most recent): Lab Results  Component Value Date   HGBA1C 7.7 01/05/2017   HGBA1C 7.9 09/29/2016   HGBA1C 7.9 06/30/2016   Lipid Panel     Component Value Date/Time   CHOL 148 01/05/2017   TRIG 184 (A) 01/05/2017   HDL 36 01/05/2017   LDLCALC 75 01/05/2017       Assessment & Plan:   1. Uncontrolled type 2 diabetes mellitus with other circulatory complication, without long-term current use of insulin (HCC)   - Patient has currently uncontrolled symptomatic type 2 DM since  61 years of age. - Her recent A1c remains  stable at 7.7%,  generally improving from  10.2 %. Recent labs reviewed.   Her diabetes is complicated by TIA, chronic heavy smoking and patient remains at a high risk for more acute and chronic complications of  diabetes which include CAD, CVA, CKD, retinopathy, and neuropathy. These are all discussed in detail with the patient.  - I have counseled the patient on diet management and weight loss, by adopting a carbohydrate restricted/protein rich diet.  - Suggestion is made for patient to avoid simple carbohydrates   from her diet including Cakes , Desserts, Ice Cream,  Soda (  diet and regular) , Sweet Tea , Candies,  Chips, Cookies, Artificial Sweeteners,   and "Sugar-free" Products . This will help patient to have stable blood glucose profile  and potentially avoid unintended weight gain.  - I encouraged the patient to switch to  unprocessed or minimally processed complex starch and increased protein intake (animal or plant source), fruits, and vegetables.  - Patient is advised to stick to a routine mealtimes to eat 3 meals  a day and avoid unnecessary snacks ( to snack only to correct hypoglycemia).   - I have approached patient with the following individualized plan to manage diabetes and patient agrees:    - I  will continue her basal  insulin  Toujeo  60 units daily at bedtime, associated with monitoring of blood glucose before breakfast and at bedtime.  -Patient is encouraged to call clinic for blood glucose levels less than 70 or above 200 mg /dl.  - I will continue metformin 850 mg by mouth twice a day, therapeutically suitable for patient.  - Patient specific target  A1c;  LDL, HDL, Triglycerides, and  Waist Circumference were discussed in detail.  2) BP/HTN: Controlled. Continue current medications including ACEI/ARB. 3) Lipids/HPL:  Controlled with LDL 75 improving from 99, triglycerides improving to 184 from 213, I advised her to continue  atorvastatin 20 mg by mouth daily at bedtime.  4)  Weight/Diet:  she has steady weight since last visit, could not use any weight   due to dietary indiscretion, CDE Consult  Is in progress  , exercise, and detailed carbohydrates information  provided.  5) Chronic Care/Health Maintenance:  -Patient is on ACEI and Statin medications and encouraged to continue to follow up with Ophthalmology, Podiatrist at least yearly or according to recommendations, and advised to  quit smoking. I have recommended yearly flu vaccine and pneumonia vaccination at least every 5 years; moderate intensity exercise for up to 150 minutes weekly; and  sleep for at least 7 hours a day.  - 20 minutes of time was spent on the care of this patient , 50% of which was applied for counseling on diabetes complications and their preventions.  - Patient to bring meter and  blood glucose logs during her next visit.   - I advised patient to maintain close follow up with Burdine, Ananias Pilgrim, MD for primary care needs.  Follow up plan: - Return in about 3 months (around 05/02/2017) for follow up with pre-visit labs, meter, and logs.  Marquis Lunch, MD Phone: [phone]  Fax: [phone]   01/30/2017, 3:16 PM

## 2017-02-05 ENCOUNTER — Other Ambulatory Visit: Payer: Self-pay | Admitting: "Endocrinology

## 2017-02-07 DIAGNOSIS — J449 Chronic obstructive pulmonary disease, unspecified: Secondary | ICD-10-CM | POA: Diagnosis not present

## 2017-02-19 ENCOUNTER — Encounter: Payer: Self-pay | Admitting: Psychiatry

## 2017-02-20 DIAGNOSIS — Z1231 Encounter for screening mammogram for malignant neoplasm of breast: Secondary | ICD-10-CM | POA: Diagnosis not present

## 2017-02-22 IMAGING — CT CT MAXILLOFACIAL W/O CM
3 series · 15 of 47 positions shown, 18 images · non-contrast
Comparison: None.

CLINICAL DATA: Chronic sinusitis

EXAM:
CT MAXILLOFACIAL WITHOUT CONTRAST
TECHNIQUE: Multidetector CT imaging of the maxillofacial structures was
performed. Multiplanar CT image reconstructions were also generated.
A small metallic BB was placed on the right temple in order to
reliably differentiate right from left.

[Series 3: axial soft · axial · 0.31mm/px · z∈[+126,+226]mm · 9 of 58 slices shown, 12 images]
[im 4/58  brain]
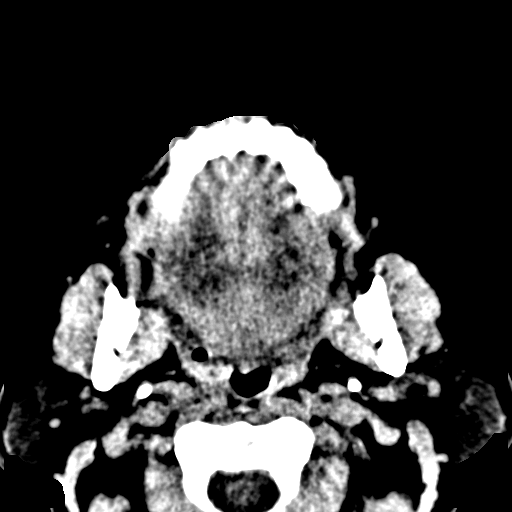
[im 4/58  bone]
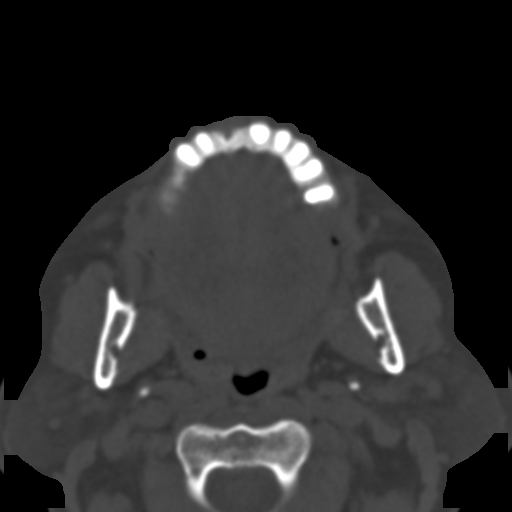
[im 10/58  bone]
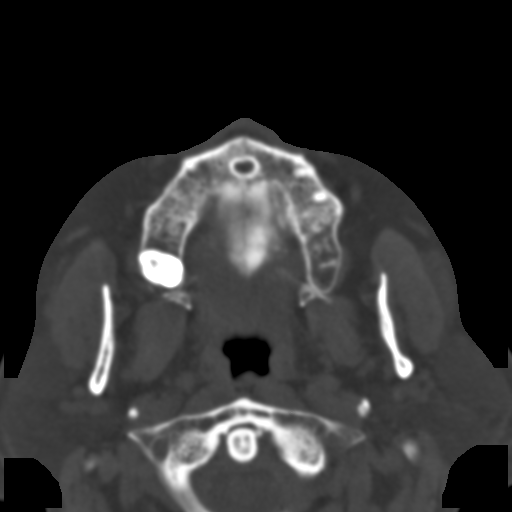
[im 16/58  bone]
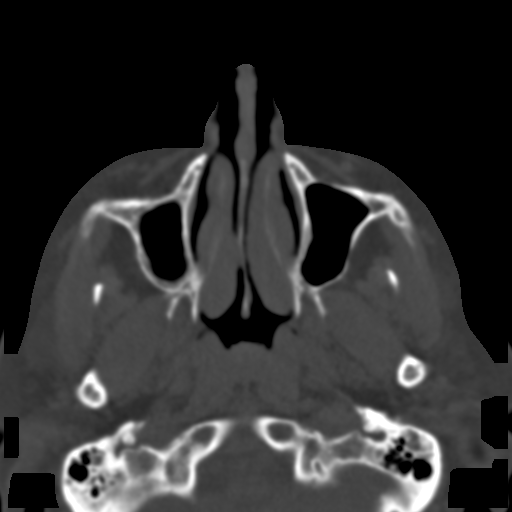
[im 22/58  bone]
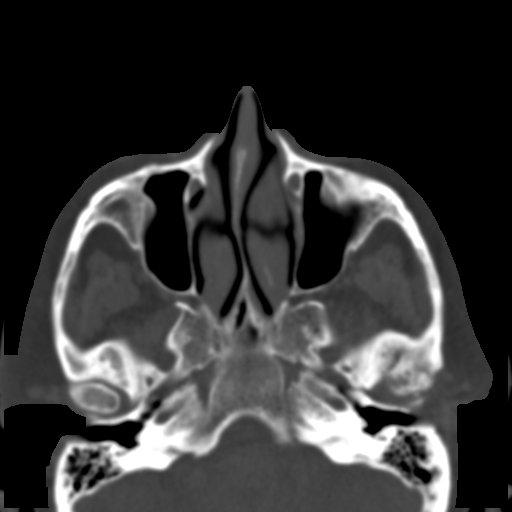
[im 30/58  brain]
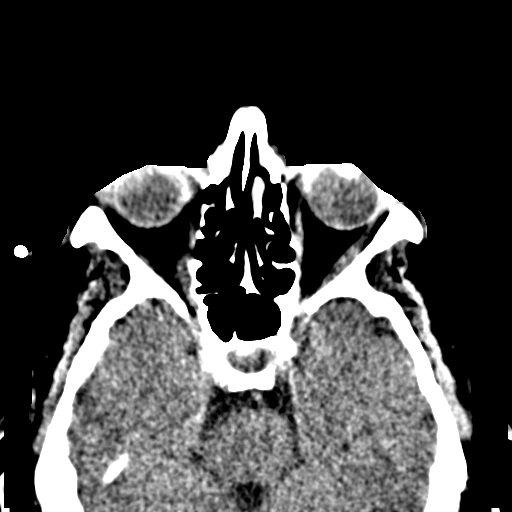
[im 30/58  bone]
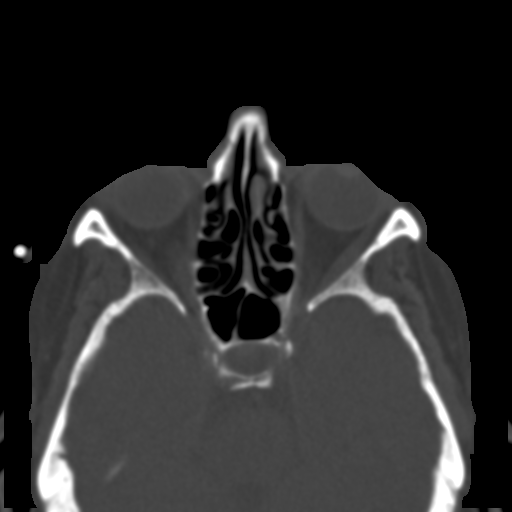
[im 36/58  bone]
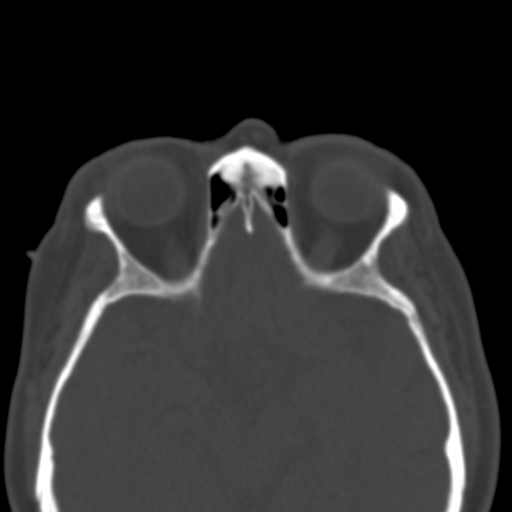
[im 42/58  bone]
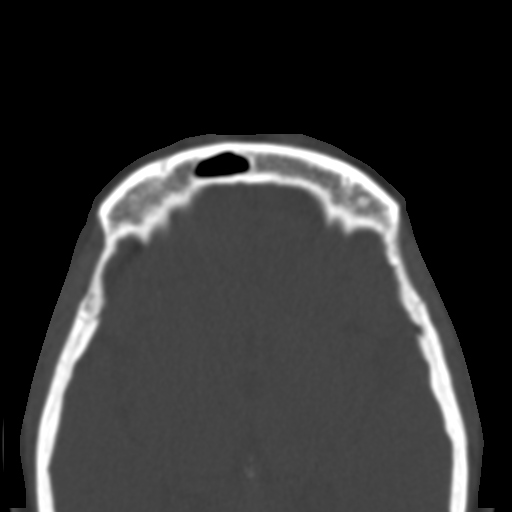
[im 48/58  bone]
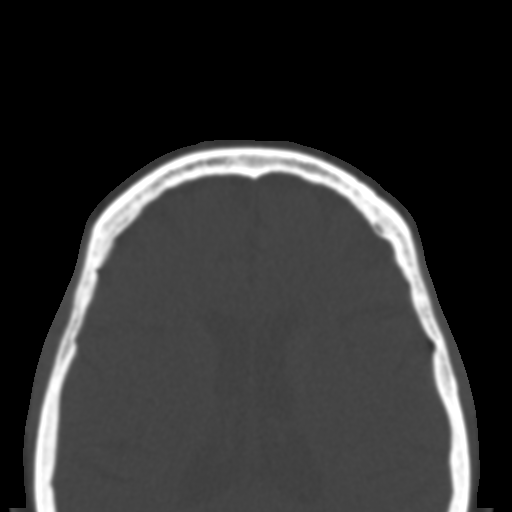
[im 54/58  brain]
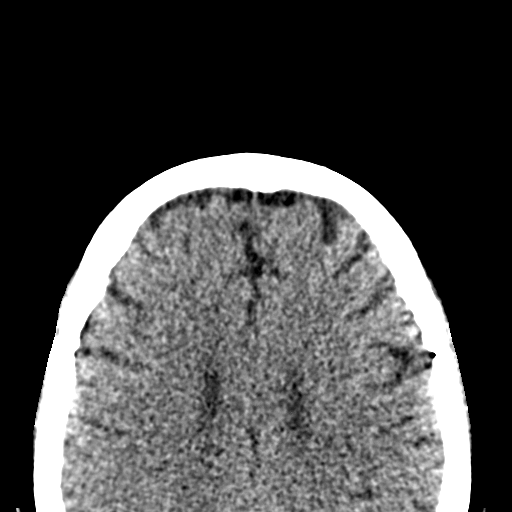
[im 54/58  bone]
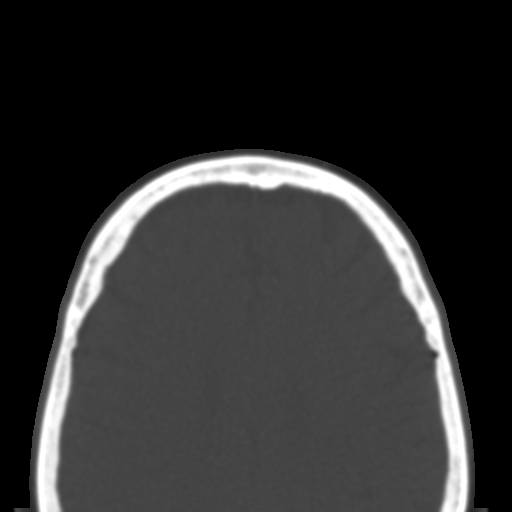

[Series 4: coronal bone · coronal · 0.26mm/px · 3 of 69 slices shown]
[im 23/69  bone]
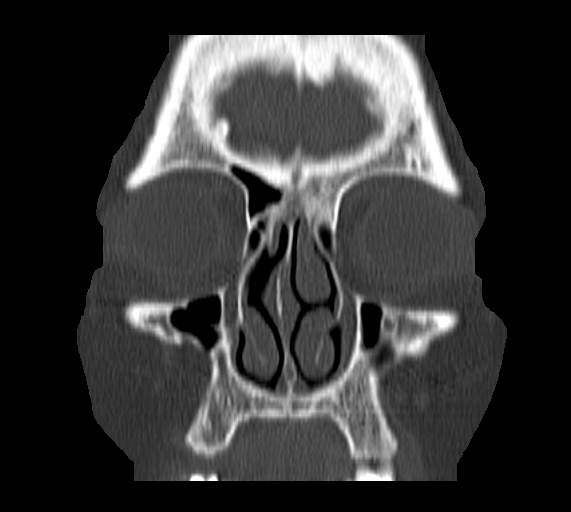
[im 31/69  bone]
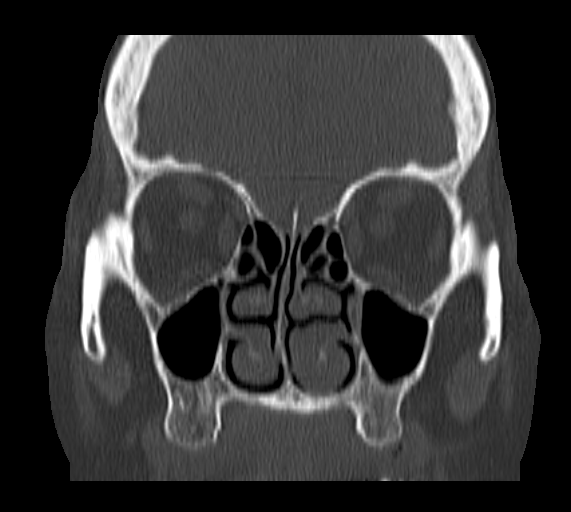
[im 38/69  bone]
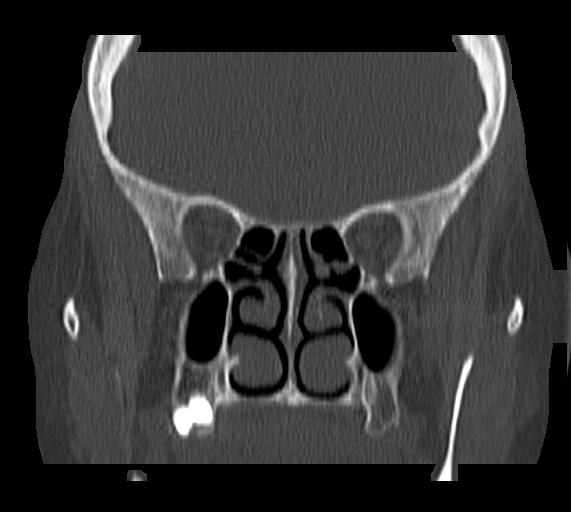

[Series 5: sagittal bone · sagittal · 0.23mm/px · 3 of 78 slices shown]
[im 26/78  bone]
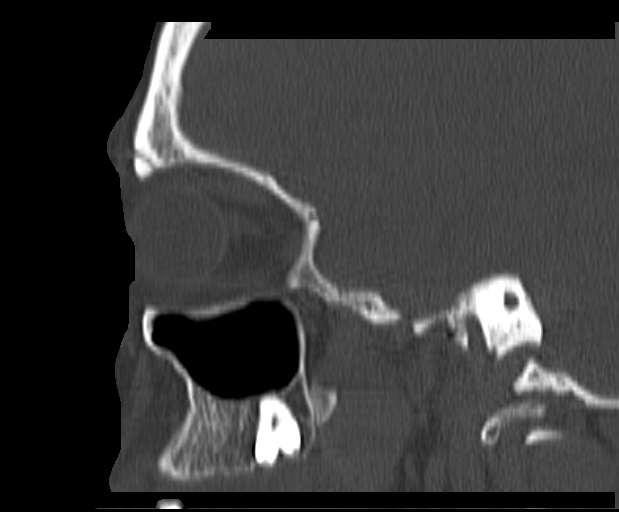
[im 39/78  bone]
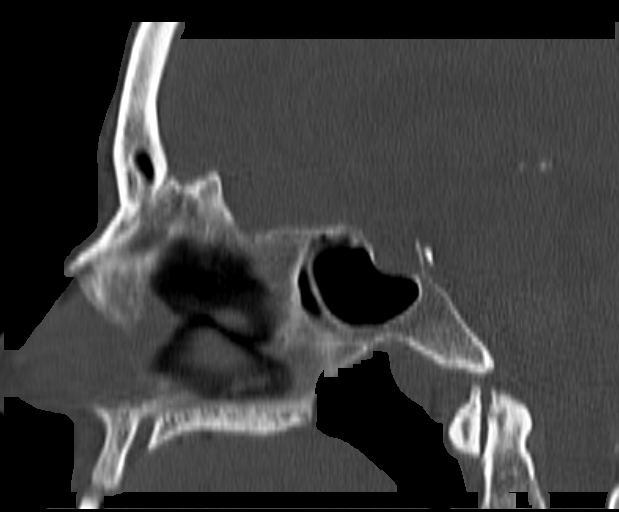
[im 52/78  bone]
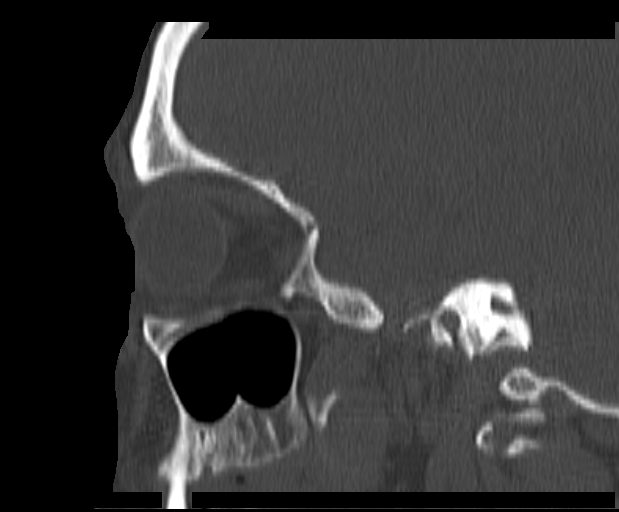

[15 of 47 positions shown; findings below may reference images not displayed]

FINDINGS: Narrowing of the ostiomeatal complex bilaterally due to mucosal
edema. Remaining maxillary sinuses clear. Hypoplastic frontal sinus
which is clear. Ethmoid and sphenoid sinuses clear. No air-fluid
level.

Nasal septum mildly deviated to the right. No acute skeletal
abnormality

Soft tissues in the orbit and face are normal. Limited intracranial
imaging negative.

Mild mastoid effusion on the left.  Right mastoid sinus clear.
IMPRESSION: Narrowing of the ostiomeatal complex bilaterally secondary to
mucosal edema. Remaining sinuses are clear. No air-fluid level

Mild mastoid sinus effusion on the left.

## 2017-03-01 DIAGNOSIS — E1165 Type 2 diabetes mellitus with hyperglycemia: Secondary | ICD-10-CM | POA: Diagnosis not present

## 2017-03-01 DIAGNOSIS — M65341 Trigger finger, right ring finger: Secondary | ICD-10-CM | POA: Diagnosis not present

## 2017-03-01 DIAGNOSIS — S9001XA Contusion of right ankle, initial encounter: Secondary | ICD-10-CM | POA: Diagnosis not present

## 2017-03-07 ENCOUNTER — Other Ambulatory Visit: Payer: Self-pay | Admitting: "Endocrinology

## 2017-03-10 DIAGNOSIS — J449 Chronic obstructive pulmonary disease, unspecified: Secondary | ICD-10-CM | POA: Diagnosis not present

## 2017-03-12 DIAGNOSIS — M65341 Trigger finger, right ring finger: Secondary | ICD-10-CM | POA: Diagnosis not present

## 2017-03-21 DIAGNOSIS — S93491D Sprain of other ligament of right ankle, subsequent encounter: Secondary | ICD-10-CM | POA: Diagnosis not present

## 2017-03-21 DIAGNOSIS — M65341 Trigger finger, right ring finger: Secondary | ICD-10-CM | POA: Diagnosis not present

## 2017-04-02 ENCOUNTER — Other Ambulatory Visit: Payer: Self-pay | Admitting: "Endocrinology

## 2017-04-09 DIAGNOSIS — J449 Chronic obstructive pulmonary disease, unspecified: Secondary | ICD-10-CM | POA: Diagnosis not present

## 2017-04-13 DIAGNOSIS — Z23 Encounter for immunization: Secondary | ICD-10-CM | POA: Diagnosis not present

## 2017-04-19 DIAGNOSIS — Z720 Tobacco use: Secondary | ICD-10-CM | POA: Diagnosis not present

## 2017-04-26 DIAGNOSIS — Z8673 Personal history of transient ischemic attack (TIA), and cerebral infarction without residual deficits: Secondary | ICD-10-CM | POA: Diagnosis not present

## 2017-04-26 DIAGNOSIS — E119 Type 2 diabetes mellitus without complications: Secondary | ICD-10-CM | POA: Diagnosis not present

## 2017-04-26 DIAGNOSIS — G2581 Restless legs syndrome: Secondary | ICD-10-CM | POA: Diagnosis not present

## 2017-04-26 DIAGNOSIS — Z1211 Encounter for screening for malignant neoplasm of colon: Secondary | ICD-10-CM | POA: Diagnosis not present

## 2017-04-26 DIAGNOSIS — I10 Essential (primary) hypertension: Secondary | ICD-10-CM | POA: Diagnosis not present

## 2017-04-26 DIAGNOSIS — M1990 Unspecified osteoarthritis, unspecified site: Secondary | ICD-10-CM | POA: Diagnosis not present

## 2017-05-02 DIAGNOSIS — E1169 Type 2 diabetes mellitus with other specified complication: Secondary | ICD-10-CM | POA: Diagnosis not present

## 2017-05-02 LAB — BASIC METABOLIC PANEL
BUN: 16 (ref 4–21)
CREATININE: 0.8 (ref <=1.1)

## 2017-05-02 LAB — HEMOGLOBIN A1C: Hemoglobin A1C: 8

## 2017-05-02 LAB — TSH: TSH: 1.88 (ref <=5.90)

## 2017-05-10 ENCOUNTER — Encounter: Payer: Self-pay | Admitting: "Endocrinology

## 2017-05-10 ENCOUNTER — Ambulatory Visit (INDEPENDENT_AMBULATORY_CARE_PROVIDER_SITE_OTHER): Payer: Medicare Other | Admitting: "Endocrinology

## 2017-05-10 VITALS — BP 123/78 | HR 84 | Ht 61.0 in | Wt 210.0 lb

## 2017-05-10 DIAGNOSIS — Z6839 Body mass index (BMI) 39.0-39.9, adult: Secondary | ICD-10-CM

## 2017-05-10 DIAGNOSIS — E669 Obesity, unspecified: Secondary | ICD-10-CM

## 2017-05-10 DIAGNOSIS — E1169 Type 2 diabetes mellitus with other specified complication: Secondary | ICD-10-CM

## 2017-05-10 DIAGNOSIS — E782 Mixed hyperlipidemia: Secondary | ICD-10-CM

## 2017-05-10 DIAGNOSIS — I10 Essential (primary) hypertension: Secondary | ICD-10-CM

## 2017-05-10 DIAGNOSIS — J449 Chronic obstructive pulmonary disease, unspecified: Secondary | ICD-10-CM | POA: Diagnosis not present

## 2017-05-10 NOTE — Patient Instructions (Signed)

## 2017-05-10 NOTE — Progress Notes (Signed)
Subjective:    Patient ID: Victoria Lara, female    DOB: 1955-10-26. Patient is being seen in f/u for management of diabetes requested by  Juliette AlcideBurdine, Steven E, MD  Past Medical History:  Diagnosis Date  . Anemia   . Anxiety   . Arthritis   . Bronchitis   . Complication of anesthesia    had to come back to ER after shoulder surgery at [facility]; hard to wake up; low O2 sats  . Depression   . Diabetes mellitus without complication (HCC)   . GERD (gastroesophageal reflux disease)   . Hyperlipidemia   . Hypertension   . Neuropathy    Takes Gabapentin  . Pneumonia 2016  . Restless leg syndrome   . Seasonal allergies   . Shortness of breath dyspnea   . Syncope   . TIA (transient ischemic attack) 2006   Past Surgical History:  Procedure Laterality Date  . BREAST LUMPECTOMY Bilateral   . CATARACT EXTRACTION W/PHACO Right 08/20/2014   Procedure: CATARACT EXTRACTION PHACO AND INTRAOCULAR LENS PLACEMENT (IOC);  Surgeon: Gemma PayorKerry Hunt, MD;  Location: [facility];  Service: Ophthalmology;  Laterality: Right;  CDE:5.31  . HAND SURGERY Bilateral   . NASAL SEPTOPLASTY W/ TURBINOPLASTY Bilateral 01/26/2016   Procedure: NASAL SEPTOPLASTY WITH BILATERAL TURBINATE REDUCTION;  Surgeon: Newman PiesSu Teoh, MD;  Location: [facility];  Service: ENT;  Laterality: Bilateral;  . NASAL SEPTUM SURGERY  01/26/2016  . NASAL SINUS SURGERY Bilateral 01/26/2016   Procedure: BILATERAL ENDOSCOPIC MAXILLARY ANTROSTOMY;  Surgeon: Newman PiesSu Teoh, MD;  Location: [facility];  Service: ENT;  Laterality: Bilateral;  . SHOULDER ARTHROSCOPY WITH ROTATOR CUFF REPAIR Right   . TUBAL LIGATION     Social History   Socioeconomic History  . Marital status: Widowed    Spouse name: None  . Number of children: 1  . Years of education: 4712  . Highest education level: None  Social Needs  . Financial resource strain: None  . Food insecurity - worry: None  . Food insecurity - inability: None  . Transportation needs - medical: None  . Transportation needs  - non-medical: None  Occupational History  . Occupation: Unemployed  Tobacco Use  . Smoking status: Former Smoker    Packs/day: 0.50    Years: 41.00    Pack years: 20.50    Types: Cigarettes    Last attempt to quit: 04/26/2017    Years since quitting: 0.0  . Smokeless tobacco: Never Used  Substance and Sexual Activity  . Alcohol use: No    Alcohol/week: 0.0 oz  . Drug use: No  . Sexual activity: None  Other Topics Concern  . None  Social History Narrative   Lives with boyfriend   Caffeine use: Drinks 1 soda a day    Outpatient Encounter Medications as of 05/10/2017  Medication Sig  . ACCU-CHEK AVIVA PLUS test strip USE TWICE DAILY TO CHECK SUGAR  . acetaminophen (TYLENOL) 500 MG tablet Take 1,000 mg by mouth every 6 (six) hours as needed for mild pain.  Marland Kitchen. albuterol (PROVENTIL) (2.5 MG/3ML) 0.083% nebulizer solution Take 3 mLs (2.5 mg total) by nebulization every 6 (six) hours as needed for wheezing.  Marland Kitchen. amitriptyline (ELAVIL) 25 MG tablet Take 25 mg by mouth at bedtime.  Marland Kitchen. amoxicillin (AMOXIL) 875 MG tablet Take 1 tablet (875 mg total) by mouth 2 (two) times daily.  Marland Kitchen. atorvastatin (LIPITOR) 20 MG tablet TAKE ONE TABLET BY MOUTH DAILY  . Blood Glucose Monitoring Suppl (ACCU-CHEK  AVIVA) device Use as instructed  . buPROPion (WELLBUTRIN SR) 150 MG 12 hr tablet Take 150 mg by mouth 2 (two) times daily.  . cetirizine (ZYRTEC) 10 MG tablet Take 10 mg by mouth daily.  . citalopram (CELEXA) 10 MG tablet Take 20 mg daily by mouth.   . clonazePAM (KLONOPIN) 0.5 MG tablet Take 0.25-0.5 mg by mouth every 8 (eight) hours as needed for anxiety. for anxiety  . ferrous sulfate 325 (65 FE) MG tablet Take 325 mg by mouth 2 (two) times daily with a meal.  . gabapentin (NEURONTIN) 300 MG capsule Take 300 mg by mouth 2 (two) times daily.  Marland Kitchen glucose blood (ACCU-CHEK AVIVA) test strip To test 4 times a day  . hydrochlorothiazide (MICROZIDE) 12.5 MG capsule Take 12.5 mg by mouth daily.  Marland Kitchen lisinopril  (PRINIVIL,ZESTRIL) 20 MG tablet Take 20 mg by mouth daily.  . meloxicam (MOBIC) 15 MG tablet Take 15 mg by mouth daily.  . metFORMIN (GLUCOPHAGE) 850 MG tablet Take 850 mg by mouth 2 (two) times daily with a meal.  . montelukast (SINGULAIR) 10 MG tablet Take 10 mg by mouth at bedtime.  Marland Kitchen omeprazole (PRILOSEC) 20 MG capsule Take 20 mg by mouth daily.  Marland Kitchen PROAIR HFA 108 (90 Base) MCG/ACT inhaler Inhale 2 puffs into the lungs every 4 (four) hours as needed for wheezing or shortness of breath.   Marland Kitchen rOPINIRole (REQUIP) 0.25 MG tablet Take 0.25 mg by mouth every evening.   Marland Kitchen TOUJEO SOLOSTAR 300 UNIT/ML SOPN INJECT 60 UNITS INTO THE SKIN AT BEDTIME  . ULTICARE MINI PEN NEEDLES 31G X 6 MM MISC USE AS DIRECTED ONCE DAILY.   No facility-administered encounter medications on file as of 05/10/2017.    ALLERGIES: No Known Allergies VACCINATION STATUS:  There is no immunization history on file for this patient.  Diabetes  She presents for her follow-up diabetic visit. She has type 2 diabetes mellitus. Onset time: She was diagnosed at approximate age of 44 years. Her disease course has been stable. There are no hypoglycemic associated symptoms. Pertinent negatives for hypoglycemia include no confusion, headaches, pallor or seizures. Associated symptoms include blurred vision and fatigue. Pertinent negatives for diabetes include no chest pain and no polyphagia. There are no hypoglycemic complications. Symptoms are stable. Diabetic complications include a CVA. (She is a chronic heavy smoker.) Risk factors for coronary artery disease include diabetes mellitus, dyslipidemia, hypertension, obesity, sedentary lifestyle and tobacco exposure. Current diabetic treatment includes oral agent (monotherapy). Her weight is increasing steadily. She is following a generally unhealthy diet. When asked about meal planning, she reported none. She rarely participates in exercise. (She did not bring any meter nor logs to review today.  Her A1c is slightly up at 8%.) An ACE inhibitor/angiotensin II receptor blocker is being taken. Eye exam is current.  Hyperlipidemia  This is a new diagnosis. The problem is uncontrolled. Recent lipid tests were reviewed and are high. Exacerbating diseases include diabetes and obesity. Pertinent negatives include no chest pain, myalgias or shortness of breath. She is currently on no antihyperlipidemic treatment. Risk factors for coronary artery disease include diabetes mellitus, dyslipidemia, hypertension and a sedentary lifestyle.  Hypertension  This is a chronic problem. The current episode started more than 1 year ago. Associated symptoms include blurred vision. Pertinent negatives include no chest pain, headaches, palpitations or shortness of breath. Risk factors for coronary artery disease include diabetes mellitus, dyslipidemia, sedentary lifestyle and smoking/tobacco exposure. Past treatments include ACE inhibitors. Hypertensive end-organ damage includes  CVA.     Review of Systems  Constitutional: Positive for fatigue. Negative for activity change, chills, fever and unexpected weight change.  HENT: Negative for trouble swallowing and voice change.   Eyes: Positive for blurred vision. Negative for visual disturbance.  Respiratory: Negative for cough, shortness of breath and wheezing.   Cardiovascular: Negative for chest pain, palpitations and leg swelling.  Gastrointestinal: Negative for diarrhea, nausea and vomiting.  Endocrine: Negative for cold intolerance, heat intolerance and polyphagia.  Genitourinary: Positive for frequency. Negative for dysuria and flank pain.  Musculoskeletal: Negative for arthralgias and myalgias.  Skin: Negative for color change, pallor, rash and wound.  Neurological: Negative for seizures and headaches.  Psychiatric/Behavioral: Negative for confusion, dysphoric mood and suicidal ideas.  All other systems reviewed and are negative.   Objective:    BP 123/78    Pulse 84   Ht 5\' 1"  (1.549 m)   Wt 210 lb (95.3 kg)   BMI 39.68 kg/m   Wt Readings from Last 3 Encounters:  05/10/17 210 lb (95.3 kg)  01/30/17 205 lb (93 kg)  10/05/16 203 lb (92.1 kg)    Physical Exam  Constitutional: She is oriented to person, place, and time. She appears well-developed.  HENT:  Head: Normocephalic and atraumatic.  Eyes: EOM are normal.  Neck: Normal range of motion. Neck supple. No tracheal deviation present. No thyromegaly present.  Cardiovascular: Normal rate and regular rhythm.  Pulmonary/Chest: Effort normal and breath sounds normal.  Abdominal: Soft. Bowel sounds are normal. There is no tenderness. There is no guarding.  Musculoskeletal: Normal range of motion. She exhibits no edema.  Neurological: She is alert and oriented to person, place, and time. She has normal reflexes. No cranial nerve deficit. Coordination normal.  Skin: Skin is warm and dry. No rash noted. No erythema. No pallor.  Psychiatric:  Hesitant affect.     CMP ( most recent) CMP     Component Value Date/Time   NA 135 01/18/2016 1630   NA 140 11/24/2015 1624   K 4.7 01/18/2016 1630   CL 98 (L) 01/18/2016 1630   CO2 27 01/18/2016 1630   GLUCOSE 245 (H) 01/18/2016 1630   BUN 16 05/02/2017   CREATININE 0.8 05/02/2017   CREATININE 0.63 01/18/2016 1630   CALCIUM 9.6 01/18/2016 1630   PROT 6.6 11/24/2015 1624   ALBUMIN 4.2 11/24/2015 1624   AST 25 11/24/2015 1624   ALT 24 11/24/2015 1624   ALKPHOS 79 11/24/2015 1624   BILITOT 0.4 11/24/2015 1624   GFRNONAA >60 01/18/2016 1630   GFRAA >60 01/18/2016 1630     Diabetic Labs (most recent): Lab Results  Component Value Date   HGBA1C 8 05/02/2017   HGBA1C 7.7 01/05/2017   HGBA1C 7.9 09/29/2016   Lipid Panel     Component Value Date/Time   CHOL 148 01/05/2017   TRIG 184 (A) 01/05/2017   HDL 36 01/05/2017   LDLCALC 75 01/05/2017       Assessment & Plan:   1. Uncontrolled type 2 diabetes mellitus with other  circulatory complication, without long-term current use of insulin (HCC)   - Patient has currently uncontrolled symptomatic type 2 DM since  61 years of age. - Her recent A1c increased slightly at 8%, after generally improving from 10.2% .  - Recent labs reviewed.   Her diabetes is complicated by TIA, chronic heavy smoking and patient remains at a high risk for more acute and chronic complications of diabetes which include CAD, CVA, CKD,  retinopathy, and neuropathy. These are all discussed in detail with the patient.  - I have counseled the patient on diet management and weight loss, by adopting a carbohydrate restricted/protein rich diet.  -  Suggestion is made for her to avoid simple carbohydrates  from her diet including Cakes, Sweet Desserts / Pastries, Ice Cream, Soda (diet and regular), Sweet Tea, Candies, Chips, Cookies, Store Bought Juices, Alcohol in Excess of  1-2 drinks a day, Artificial Sweeteners, and "Sugar-free" Products. This will help patient to have stable blood glucose profile and potentially avoid unintended weight gain.   - I encouraged the patient to switch to  unprocessed or minimally processed complex starch and increased protein intake (animal or plant source), fruits, and vegetables.  - Patient is advised to stick to a routine mealtimes to eat 3 meals  a day and avoid unnecessary snacks ( to snack only to correct hypoglycemia).   - I have approached patient with the following individualized plan to manage diabetes and patient agrees:    - I advised her to continue her basal insulin  Toujeo  60 units daily at bedtime, associated with monitoring of blood glucose before breakfast and at bedtime.  -Patient is encouraged to call clinic for blood glucose levels less than 70 or above 200 mg /dl.  - I will continue metformin 850 mg by mouth twice a day, therapeutically suitable for patient.  - Patient specific target  A1c;  LDL, HDL, Triglycerides, and  Waist  Circumference were discussed in detail.  2) BP/HTN: Controlled. Continue current medications including ACEI/ARB. 3) Lipids/HPL:  Controlled with LDL 75 improving from 99, triglycerides improving to 184 from 213, I advised her to continue  atorvastatin 20 mg by mouth daily at bedtime.  4)  Weight/Diet:  She is progressively gained weight , she still admits significant  dietary indiscretion, CDE Consult  Is in progress  , exercise, and detailed carbohydrates information provided.  5) Chronic Care/Health Maintenance:  -Patient is on ACEI and Statin medications and encouraged to continue to follow up with Ophthalmology, Podiatrist at least yearly or according to recommendations, and advised to  quit smoking. I have recommended yearly flu vaccine and pneumonia vaccination at least every 5 years; moderate intensity exercise for up to 150 minutes weekly; and  sleep for at least 7 hours a day.  - I advised patient to maintain close follow up with Burdine, Ananias Pilgrim, MD for primary care needs.  - Time spent with the patient: 25 min, of which >50% was spent in reviewing her sugar logs , discussing her hypo- and hyper-glycemic episodes, reviewing her current and  previous labs and insulin doses and developing a plan to avoid hypo- and hyper-glycemia.    Follow up plan: - Return in about 3 months (around 08/10/2017) for meter, and logs, follow up with pre-visit labs, meter, and logs.  Marquis Lunch, MD Phone: [phone]  Fax: [phone]   -  This note was partially dictated with voice recognition software. Similar sounding words can be transcribed inadequately or may not  be corrected upon review.  05/10/2017, 12:54 PM

## 2017-05-12 ENCOUNTER — Other Ambulatory Visit: Payer: Self-pay | Admitting: "Endocrinology

## 2017-05-15 ENCOUNTER — Other Ambulatory Visit: Payer: Self-pay

## 2017-05-15 MED ORDER — INSULIN GLARGINE 300 UNIT/ML ~~LOC~~ SOPN: 60.0000 [IU] | PEN_INJECTOR | Freq: Every day | SUBCUTANEOUS | 2 refills | Status: DC

## 2017-05-28 DIAGNOSIS — E1165 Type 2 diabetes mellitus with hyperglycemia: Secondary | ICD-10-CM | POA: Diagnosis not present

## 2017-05-28 DIAGNOSIS — Z720 Tobacco use: Secondary | ICD-10-CM | POA: Diagnosis not present

## 2017-05-28 DIAGNOSIS — Z0001 Encounter for general adult medical examination with abnormal findings: Secondary | ICD-10-CM | POA: Diagnosis not present

## 2017-06-09 DIAGNOSIS — J449 Chronic obstructive pulmonary disease, unspecified: Secondary | ICD-10-CM | POA: Diagnosis not present

## 2017-07-10 DIAGNOSIS — J449 Chronic obstructive pulmonary disease, unspecified: Secondary | ICD-10-CM | POA: Diagnosis not present

## 2017-07-16 DIAGNOSIS — R5382 Chronic fatigue, unspecified: Secondary | ICD-10-CM | POA: Diagnosis not present

## 2017-07-16 DIAGNOSIS — E1169 Type 2 diabetes mellitus with other specified complication: Secondary | ICD-10-CM | POA: Diagnosis not present

## 2017-07-16 DIAGNOSIS — E7800 Pure hypercholesterolemia, unspecified: Secondary | ICD-10-CM | POA: Diagnosis not present

## 2017-07-18 DIAGNOSIS — E1169 Type 2 diabetes mellitus with other specified complication: Secondary | ICD-10-CM | POA: Diagnosis not present

## 2017-07-18 DIAGNOSIS — G2581 Restless legs syndrome: Secondary | ICD-10-CM | POA: Diagnosis not present

## 2017-07-18 DIAGNOSIS — I10 Essential (primary) hypertension: Secondary | ICD-10-CM | POA: Diagnosis not present

## 2017-07-18 DIAGNOSIS — E1165 Type 2 diabetes mellitus with hyperglycemia: Secondary | ICD-10-CM | POA: Diagnosis not present

## 2017-07-27 ENCOUNTER — Other Ambulatory Visit: Payer: Self-pay

## 2017-07-27 MED ORDER — INSULIN GLARGINE 300 UNIT/ML ~~LOC~~ SOPN: 60.0000 [IU] | PEN_INJECTOR | Freq: Every day | SUBCUTANEOUS | 2 refills | Status: DC

## 2017-08-09 DIAGNOSIS — M79605 Pain in left leg: Secondary | ICD-10-CM | POA: Diagnosis not present

## 2017-08-09 DIAGNOSIS — G4733 Obstructive sleep apnea (adult) (pediatric): Secondary | ICD-10-CM | POA: Diagnosis not present

## 2017-08-09 DIAGNOSIS — E1165 Type 2 diabetes mellitus with hyperglycemia: Secondary | ICD-10-CM | POA: Diagnosis not present

## 2017-08-09 DIAGNOSIS — M545 Low back pain: Secondary | ICD-10-CM | POA: Diagnosis not present

## 2017-08-09 LAB — LIPID PANEL
Cholesterol: 145 (ref 0–200)
HDL: 40 (ref 35–70)
LDL Cholesterol: 72
Triglycerides: 167 — AB (ref 40–160)

## 2017-08-09 LAB — TSH: TSH: 1.4 (ref 0.41–5.90)

## 2017-08-09 LAB — BASIC METABOLIC PANEL
BUN: 13 (ref 4–21)
Creatinine: 0.8 (ref 0.5–1.1)

## 2017-08-09 LAB — HEMOGLOBIN A1C: Hemoglobin A1C: 8

## 2017-08-10 DIAGNOSIS — J449 Chronic obstructive pulmonary disease, unspecified: Secondary | ICD-10-CM | POA: Diagnosis not present

## 2017-08-16 ENCOUNTER — Encounter: Payer: Self-pay | Admitting: "Endocrinology

## 2017-08-16 ENCOUNTER — Ambulatory Visit (INDEPENDENT_AMBULATORY_CARE_PROVIDER_SITE_OTHER): Payer: Medicare Other | Admitting: "Endocrinology

## 2017-08-16 VITALS — BP 124/82 | HR 76 | Ht 61.0 in | Wt 209.0 lb

## 2017-08-16 DIAGNOSIS — Z6839 Body mass index (BMI) 39.0-39.9, adult: Secondary | ICD-10-CM

## 2017-08-16 DIAGNOSIS — E1169 Type 2 diabetes mellitus with other specified complication: Secondary | ICD-10-CM

## 2017-08-16 DIAGNOSIS — E782 Mixed hyperlipidemia: Secondary | ICD-10-CM | POA: Diagnosis not present

## 2017-08-16 DIAGNOSIS — I10 Essential (primary) hypertension: Secondary | ICD-10-CM | POA: Diagnosis not present

## 2017-08-16 DIAGNOSIS — E669 Obesity, unspecified: Secondary | ICD-10-CM

## 2017-08-16 MED ORDER — INSULIN GLARGINE 300 UNIT/ML ~~LOC~~ SOPN: 70.0000 [IU] | PEN_INJECTOR | Freq: Every day | SUBCUTANEOUS | 2 refills | Status: DC

## 2017-08-16 NOTE — Patient Instructions (Signed)

## 2017-08-16 NOTE — Progress Notes (Signed)
Subjective:    Patient ID: Victoria Lara, female    DOB: [birth year]. Patient is being seen in f/u for management of diabetes requested by  Juliette Alcide, MD  Past Medical History:  Diagnosis Date  . Anemia   . Anxiety   . Arthritis   . Bronchitis   . Complication of anesthesia    had to come back to ER after shoulder surgery at [facility]; hard to wake up; low O2 sats  . Depression   . Diabetes mellitus without complication (HCC)   . GERD (gastroesophageal reflux disease)   . Hyperlipidemia   . Hypertension   . Neuropathy    Takes Gabapentin  . Pneumonia 2016  . Restless leg syndrome   . Seasonal allergies   . Shortness of breath dyspnea   . Syncope   . TIA (transient ischemic attack) 2006   Past Surgical History:  Procedure Laterality Date  . BREAST LUMPECTOMY Bilateral   . CATARACT EXTRACTION W/PHACO Right 08/20/2014   Procedure: CATARACT EXTRACTION PHACO AND INTRAOCULAR LENS PLACEMENT (IOC);  Surgeon: Gemma Payor, MD;  Location: [facility];  Service: Ophthalmology;  Laterality: Right;  CDE:5.31  . HAND SURGERY Bilateral   . NASAL SEPTOPLASTY W/ TURBINOPLASTY Bilateral 01/26/2016   Procedure: NASAL SEPTOPLASTY WITH BILATERAL TURBINATE REDUCTION;  Surgeon: Newman Pies, MD;  Location: [facility];  Service: ENT;  Laterality: Bilateral;  . NASAL SEPTUM SURGERY  01/26/2016  . NASAL SINUS SURGERY Bilateral 01/26/2016   Procedure: BILATERAL ENDOSCOPIC MAXILLARY ANTROSTOMY;  Surgeon: Newman Pies, MD;  Location: [facility];  Service: ENT;  Laterality: Bilateral;  . SHOULDER ARTHROSCOPY WITH ROTATOR CUFF REPAIR Right   . TUBAL LIGATION     Social History   Socioeconomic History  . Marital status: Widowed    Spouse name: None  . Number of children: 1  . Years of education: 82  . Highest education level: None  Social Needs  . Financial resource strain: None  . Food insecurity - worry: None  . Food insecurity - inability: None  . Transportation needs - medical: None  . Transportation needs  - non-medical: None  Occupational History  . Occupation: Unemployed  Tobacco Use  . Smoking status: Former Smoker    Packs/day: 0.50    Years: 41.00    Pack years: 20.50    Types: Cigarettes    Last attempt to quit: 04/26/2017    Years since quitting: 0.3  . Smokeless tobacco: Never Used  Substance and Sexual Activity  . Alcohol use: No    Alcohol/week: 0.0 oz  . Drug use: No  . Sexual activity: None  Other Topics Concern  . None  Social History Narrative   Lives with boyfriend   Caffeine use: Drinks 1 soda a day    Outpatient Encounter Medications as of 08/16/2017  Medication Sig  . ACCU-CHEK AVIVA PLUS test strip USE TWICE DAILY TO CHECK SUGAR  . acetaminophen (TYLENOL) 500 MG tablet Take 1,000 mg by mouth every 6 (six) hours as needed for mild pain.  Marland Kitchen albuterol (PROVENTIL) (2.5 MG/3ML) 0.083% nebulizer solution Take 3 mLs (2.5 mg total) by nebulization every 6 (six) hours as needed for wheezing.  Marland Kitchen amitriptyline (ELAVIL) 25 MG tablet Take 25 mg by mouth at bedtime.  Marland Kitchen atorvastatin (LIPITOR) 20 MG tablet TAKE ONE TABLET BY MOUTH DAILY  . Blood Glucose Monitoring Suppl (ACCU-CHEK AVIVA) device Use as instructed  . buPROPion (WELLBUTRIN SR) 150 MG 12 hr tablet Take 150 mg by  mouth 2 (two) times daily.  . cetirizine (ZYRTEC) 10 MG tablet Take 10 mg by mouth daily.  . citalopram (CELEXA) 10 MG tablet Take 20 mg daily by mouth.   . clonazePAM (KLONOPIN) 0.5 MG tablet Take 0.25-0.5 mg by mouth every 8 (eight) hours as needed for anxiety. for anxiety  . ferrous sulfate 325 (65 FE) MG tablet Take 325 mg by mouth 2 (two) times daily with a meal.  . gabapentin (NEURONTIN) 300 MG capsule Take 300 mg by mouth 2 (two) times daily.  Marland Kitchen glucose blood (ACCU-CHEK AVIVA) test strip To test 4 times a day  . hydrochlorothiazide (MICROZIDE) 12.5 MG capsule Take 12.5 mg by mouth daily.  . Insulin Glargine (TOUJEO SOLOSTAR) 300 UNIT/ML SOPN Inject 70 Units into the skin at bedtime.  Marland Kitchen lisinopril  (PRINIVIL,ZESTRIL) 20 MG tablet Take 20 mg by mouth daily.  . meloxicam (MOBIC) 15 MG tablet Take 15 mg by mouth daily.  . metFORMIN (GLUCOPHAGE) 850 MG tablet Take 850 mg by mouth 2 (two) times daily with a meal.  . montelukast (SINGULAIR) 10 MG tablet Take 10 mg by mouth at bedtime.  Marland Kitchen omeprazole (PRILOSEC) 20 MG capsule Take 20 mg by mouth daily.  Marland Kitchen PROAIR HFA 108 (90 Base) MCG/ACT inhaler Inhale 2 puffs into the lungs every 4 (four) hours as needed for wheezing or shortness of breath.   Marland Kitchen rOPINIRole (REQUIP) 0.25 MG tablet Take 0.25 mg by mouth every evening.   Marland Kitchen ULTICARE MINI PEN NEEDLES 31G X 6 MM MISC USE AS DIRECTED ONCE DAILY.  . [DISCONTINUED] amoxicillin (AMOXIL) 875 MG tablet Take 1 tablet (875 mg total) by mouth 2 (two) times daily.  . [DISCONTINUED] Insulin Glargine (TOUJEO SOLOSTAR) 300 UNIT/ML SOPN Inject 60 Units into the skin at bedtime.   No facility-administered encounter medications on file as of 08/16/2017.    ALLERGIES: No Known Allergies VACCINATION STATUS:  There is no immunization history on file for this patient.  Diabetes  She presents for her follow-up diabetic visit. She has type 2 diabetes mellitus. Onset time: She was diagnosed at approximate age of 62 years. Her disease course has been stable. There are no hypoglycemic associated symptoms. Pertinent negatives for hypoglycemia include no confusion, headaches, pallor or seizures. Associated symptoms include blurred vision and fatigue. Pertinent negatives for diabetes include no chest pain and no polyphagia. There are no hypoglycemic complications. Symptoms are stable. Diabetic complications include a CVA. (She is a chronic heavy smoker.) Risk factors for coronary artery disease include diabetes mellitus, dyslipidemia, hypertension, obesity, sedentary lifestyle and tobacco exposure. Current diabetic treatment includes oral agent (monotherapy). Her weight is stable. She is following a generally unhealthy diet. When  asked about meal planning, she reported none. She rarely participates in exercise. Her breakfast blood glucose range is generally 140-180 mg/dl. Her overall blood glucose range is 140-180 mg/dl. An ACE inhibitor/angiotensin II receptor blocker is being taken. Eye exam is current.  Hyperlipidemia  This is a new diagnosis. The problem is uncontrolled. Recent lipid tests were reviewed and are high. Exacerbating diseases include diabetes and obesity. Pertinent negatives include no chest pain, myalgias or shortness of breath. She is currently on no antihyperlipidemic treatment. Risk factors for coronary artery disease include diabetes mellitus, dyslipidemia, hypertension, a sedentary lifestyle, obesity and post-menopausal.  Hypertension  This is a chronic problem. The current episode started more than 1 year ago. Associated symptoms include blurred vision. Pertinent negatives include no chest pain, headaches, palpitations or shortness of breath. Risk factors for  coronary artery disease include diabetes mellitus, dyslipidemia, sedentary lifestyle, smoking/tobacco exposure, post-menopausal state and family history. Past treatments include ACE inhibitors. Hypertensive end-organ damage includes CVA.     Review of Systems  Constitutional: Positive for fatigue. Negative for activity change, chills, fever and unexpected weight change.  HENT: Negative for trouble swallowing and voice change.   Eyes: Positive for blurred vision. Negative for visual disturbance.  Respiratory: Negative for cough, shortness of breath and wheezing.   Cardiovascular: Negative for chest pain, palpitations and leg swelling.  Gastrointestinal: Negative for diarrhea, nausea and vomiting.  Endocrine: Negative for cold intolerance, heat intolerance and polyphagia.  Genitourinary: Positive for frequency. Negative for dysuria and flank pain.  Musculoskeletal: Negative for arthralgias and myalgias.  Skin: Negative for color change, pallor,  rash and wound.  Neurological: Negative for seizures and headaches.  Psychiatric/Behavioral: Negative for confusion, dysphoric mood and suicidal ideas.  All other systems reviewed and are negative.   Objective:    BP 124/82   Pulse 76   Ht 5\' 1"  (1.549 m)   Wt 209 lb (94.8 kg)   BMI 39.49 kg/m   Wt Readings from Last 3 Encounters:  08/16/17 209 lb (94.8 kg)  05/10/17 210 lb (95.3 kg)  01/30/17 205 lb (93 kg)    Physical Exam  Constitutional: She is oriented to person, place, and time. She appears well-developed.  HENT:  Head: Normocephalic and atraumatic.  Eyes: EOM are normal.  Neck: Normal range of motion. Neck supple. No tracheal deviation present. No thyromegaly present.  Cardiovascular: Normal rate and regular rhythm.  Pulmonary/Chest: Effort normal and breath sounds normal.  Abdominal: Soft. Bowel sounds are normal. There is no tenderness. There is no guarding.  Musculoskeletal: Normal range of motion. She exhibits no edema.  Neurological: She is alert and oriented to person, place, and time. She has normal reflexes. No cranial nerve deficit. Coordination normal.  Skin: Skin is warm and dry. No rash noted. No erythema. No pallor.  Psychiatric:  Hesitant affect.     CMP ( most recent) CMP     Component Value Date/Time   NA 135 01/18/2016 1630   NA 140 11/24/2015 1624   K 4.7 01/18/2016 1630   CL 98 (L) 01/18/2016 1630   CO2 27 01/18/2016 1630   GLUCOSE 245 (H) 01/18/2016 1630   BUN 13 08/09/2017   CREATININE 0.8 08/09/2017   CREATININE 0.63 01/18/2016 1630   CALCIUM 9.6 01/18/2016 1630   PROT 6.6 11/24/2015 1624   ALBUMIN 4.2 11/24/2015 1624   AST 25 11/24/2015 1624   ALT 24 11/24/2015 1624   ALKPHOS 79 11/24/2015 1624   BILITOT 0.4 11/24/2015 1624   GFRNONAA >60 01/18/2016 1630   GFRAA >60 01/18/2016 1630     Diabetic Labs (most recent): Lab Results  Component Value Date   HGBA1C 8 08/09/2017   HGBA1C 8 05/02/2017   HGBA1C 7.7 01/05/2017    Lipid Panel     Component Value Date/Time   CHOL 145 08/09/2017   TRIG 167 (A) 08/09/2017   HDL 40 08/09/2017   LDLCALC 72 08/09/2017       Assessment & Plan:   1. Uncontrolled type 2 diabetes mellitus with other circulatory complication, without long-term current use of insulin (HCC)   - Patient has currently uncontrolled symptomatic type 2 DM since  62 years of age. -She returns with A1c unchanged at 8%, although generally improving from 10.2%.  Her fasting blood glucose profile is considered controlled to near target.  -  Recent labs reviewed.   Her diabetes is complicated by TIA, chronic heavy smoking and patient remains at a high risk for more acute and chronic complications of diabetes which include CAD, CVA, CKD, retinopathy, and neuropathy. These are all discussed in detail with the patient.  - I have counseled the patient on diet management and weight loss, by adopting a carbohydrate restricted/protein rich diet.  -  Suggestion is made for her to avoid simple carbohydrates  from her diet including Cakes, Sweet Desserts / Pastries, Ice Cream, Soda (diet and regular), Sweet Tea, Candies, Chips, Cookies, Store Bought Juices, Alcohol in Excess of  1-2 drinks a day, Artificial Sweeteners, and "Sugar-free" Products. This will help patient to have stable blood glucose profile and potentially avoid unintended weight gain.  - I encouraged the patient to switch to  unprocessed or minimally processed complex starch and increased protein intake (animal or plant source), fruits, and vegetables.  - Patient is advised to stick to a routine mealtimes to eat 3 meals  a day and avoid unnecessary snacks ( to snack only to correct hypoglycemia).   - I have approached patient with the following individualized plan to manage diabetes and patient agrees:    - I advised her to increase her basal insulin Toujeo to 70 units daily at bedtime, associated with monitoring of blood glucose before  breakfast and at bedtime.  -Patient is encouraged to call clinic for blood glucose levels less than 70 or above 200 mg /dl.  - I will continue metformin 850 mg by mouth twice a day, therapeutically suitable for patient.  - Patient specific target  A1c;  LDL, HDL, Triglycerides, and  Waist Circumference were discussed in detail.  2) BP/HTN: Her blood pressure is controlled to target.  I advised her to continue her current blood pressure medications including ACEI/ARB. 3) Lipids/HPL:  Controlled with LDL 72 improving from 99, triglycerides improving to 167 from 213, I advised her to continue  atorvastatin 20 mg by mouth daily at bedtime.   4)  Weight/Diet:  She did not have any further success in weight loss, she still admits significant  dietary indiscretion, CDE Consult  Is in progress  , exercise, and detailed carbohydrates information provided.  5) Chronic Care/Health Maintenance:  -Patient is on ACEI and Statin medications and encouraged to continue to follow up with Ophthalmology, Podiatrist at least yearly or according to recommendations, and advised to  quit smoking. I have recommended yearly flu vaccine and pneumonia vaccination at least every 5 years; moderate intensity exercise for up to 150 minutes weekly; and  sleep for at least 7 hours a day.  - I advised patient to maintain close follow up with Burdine, Ananias Pilgrim, MD for primary care needs.  - Time spent with the patient: 25 min, of which >50% was spent in reviewing her blood glucose logs , discussing her hypo- and hyper-glycemic episodes, reviewing her current and  previous labs and insulin doses and developing a plan to avoid hypo- and hyper-glycemia. Please refer to Patient Instructions for Blood Glucose Monitoring and Insulin/Medications Dosing Guide"  in media tab for additional information.   Follow up plan: - Return in about 3 months (around 11/13/2017) for meter, and logs, follow up with pre-visit labs, meter, and  logs.  Marquis Lunch, MD Phone: [phone]  Fax: [phone]   -  This note was partially dictated with voice recognition software. Similar sounding words can be transcribed inadequately or may not  be corrected upon review.  08/16/2017, 10:26 AM

## 2017-08-31 DIAGNOSIS — M545 Low back pain: Secondary | ICD-10-CM | POA: Diagnosis not present

## 2017-08-31 DIAGNOSIS — M79605 Pain in left leg: Secondary | ICD-10-CM | POA: Diagnosis not present

## 2017-08-31 DIAGNOSIS — M48061 Spinal stenosis, lumbar region without neurogenic claudication: Secondary | ICD-10-CM | POA: Diagnosis not present

## 2017-09-07 DIAGNOSIS — J449 Chronic obstructive pulmonary disease, unspecified: Secondary | ICD-10-CM | POA: Diagnosis not present

## 2017-09-11 DIAGNOSIS — M65341 Trigger finger, right ring finger: Secondary | ICD-10-CM | POA: Diagnosis not present

## 2017-09-14 DIAGNOSIS — M65341 Trigger finger, right ring finger: Secondary | ICD-10-CM | POA: Diagnosis not present

## 2017-09-20 DIAGNOSIS — M65341 Trigger finger, right ring finger: Secondary | ICD-10-CM | POA: Diagnosis not present

## 2017-10-05 DIAGNOSIS — M65341 Trigger finger, right ring finger: Secondary | ICD-10-CM | POA: Diagnosis not present

## 2017-10-08 DIAGNOSIS — J449 Chronic obstructive pulmonary disease, unspecified: Secondary | ICD-10-CM | POA: Diagnosis not present

## 2017-10-18 DIAGNOSIS — E7800 Pure hypercholesterolemia, unspecified: Secondary | ICD-10-CM | POA: Diagnosis not present

## 2017-10-18 DIAGNOSIS — Z720 Tobacco use: Secondary | ICD-10-CM | POA: Diagnosis not present

## 2017-10-18 DIAGNOSIS — G2581 Restless legs syndrome: Secondary | ICD-10-CM | POA: Diagnosis not present

## 2017-10-18 DIAGNOSIS — G4733 Obstructive sleep apnea (adult) (pediatric): Secondary | ICD-10-CM | POA: Diagnosis not present

## 2017-10-18 DIAGNOSIS — E1165 Type 2 diabetes mellitus with hyperglycemia: Secondary | ICD-10-CM | POA: Diagnosis not present

## 2017-10-23 DIAGNOSIS — E1169 Type 2 diabetes mellitus with other specified complication: Secondary | ICD-10-CM | POA: Diagnosis not present

## 2017-10-23 DIAGNOSIS — E7800 Pure hypercholesterolemia, unspecified: Secondary | ICD-10-CM | POA: Diagnosis not present

## 2017-10-23 DIAGNOSIS — E1165 Type 2 diabetes mellitus with hyperglycemia: Secondary | ICD-10-CM | POA: Diagnosis not present

## 2017-10-23 DIAGNOSIS — I10 Essential (primary) hypertension: Secondary | ICD-10-CM | POA: Diagnosis not present

## 2017-10-23 DIAGNOSIS — Z720 Tobacco use: Secondary | ICD-10-CM | POA: Diagnosis not present

## 2017-10-23 DIAGNOSIS — Z1389 Encounter for screening for other disorder: Secondary | ICD-10-CM | POA: Diagnosis not present

## 2017-10-23 DIAGNOSIS — Z1331 Encounter for screening for depression: Secondary | ICD-10-CM | POA: Diagnosis not present

## 2017-11-07 DIAGNOSIS — J449 Chronic obstructive pulmonary disease, unspecified: Secondary | ICD-10-CM | POA: Diagnosis not present

## 2017-11-09 DIAGNOSIS — R5382 Chronic fatigue, unspecified: Secondary | ICD-10-CM | POA: Diagnosis not present

## 2017-11-09 DIAGNOSIS — E1169 Type 2 diabetes mellitus with other specified complication: Secondary | ICD-10-CM | POA: Diagnosis not present

## 2017-11-09 DIAGNOSIS — I10 Essential (primary) hypertension: Secondary | ICD-10-CM | POA: Diagnosis not present

## 2017-11-09 DIAGNOSIS — E7800 Pure hypercholesterolemia, unspecified: Secondary | ICD-10-CM | POA: Diagnosis not present

## 2017-11-09 LAB — HEMOGLOBIN A1C: HEMOGLOBIN A1C: 6.8

## 2017-11-09 LAB — BASIC METABOLIC PANEL
BUN: 23 — AB (ref 4–21)
CREATININE: 0.7 (ref <=1.1)

## 2017-11-09 LAB — TSH: TSH: 2.38 (ref <=5.90)

## 2017-11-15 ENCOUNTER — Encounter: Payer: Self-pay | Admitting: "Endocrinology

## 2017-11-15 ENCOUNTER — Ambulatory Visit (INDEPENDENT_AMBULATORY_CARE_PROVIDER_SITE_OTHER): Payer: Medicare Other | Admitting: "Endocrinology

## 2017-11-15 VITALS — BP 132/76 | HR 87 | Ht 61.0 in | Wt 207.0 lb

## 2017-11-15 DIAGNOSIS — E669 Obesity, unspecified: Secondary | ICD-10-CM | POA: Diagnosis not present

## 2017-11-15 DIAGNOSIS — E1169 Type 2 diabetes mellitus with other specified complication: Secondary | ICD-10-CM | POA: Diagnosis not present

## 2017-11-15 DIAGNOSIS — E782 Mixed hyperlipidemia: Secondary | ICD-10-CM

## 2017-11-15 DIAGNOSIS — I10 Essential (primary) hypertension: Secondary | ICD-10-CM | POA: Diagnosis not present

## 2017-11-15 NOTE — Patient Instructions (Signed)

## 2017-11-15 NOTE — Progress Notes (Signed)
Subjective:    Patient ID: Victoria Lara, female    DOB: 1955/12/17. Patient is being seen in f/u for management of diabetes requested by  Juliette Alcide, MD  Past Medical History:  Diagnosis Date  . Anemia   . Anxiety   . Arthritis   . Bronchitis   . Complication of anesthesia    had to come back to ER after shoulder surgery at [facility]; hard to wake up; low O2 sats  . Depression   . Diabetes mellitus without complication (HCC)   . GERD (gastroesophageal reflux disease)   . Hyperlipidemia   . Hypertension   . Neuropathy    Takes Gabapentin  . Pneumonia 2016  . Restless leg syndrome   . Seasonal allergies   . Shortness of breath dyspnea   . Syncope   . TIA (transient ischemic attack) 2006   Past Surgical History:  Procedure Laterality Date  . BREAST LUMPECTOMY Bilateral   . CATARACT EXTRACTION W/PHACO Right 08/20/2014   Procedure: CATARACT EXTRACTION PHACO AND INTRAOCULAR LENS PLACEMENT (IOC);  Surgeon: Gemma Payor, MD;  Location: [facility];  Service: Ophthalmology;  Laterality: Right;  CDE:5.31  . HAND SURGERY Bilateral   . NASAL SEPTOPLASTY W/ TURBINOPLASTY Bilateral 01/26/2016   Procedure: NASAL SEPTOPLASTY WITH BILATERAL TURBINATE REDUCTION;  Surgeon: Newman Pies, MD;  Location: [facility];  Service: ENT;  Laterality: Bilateral;  . NASAL SEPTUM SURGERY  01/26/2016  . NASAL SINUS SURGERY Bilateral 01/26/2016   Procedure: BILATERAL ENDOSCOPIC MAXILLARY ANTROSTOMY;  Surgeon: Newman Pies, MD;  Location: [facility];  Service: ENT;  Laterality: Bilateral;  . SHOULDER ARTHROSCOPY WITH ROTATOR CUFF REPAIR Right   . TUBAL LIGATION     Social History   Socioeconomic History  . Marital status: Widowed    Spouse name: Not on file  . Number of children: 1  . Years of education: 61  . Highest education level: Not on file  Occupational History  . Occupation: Unemployed  Social Needs  . Financial resource strain: Not on file  . Food insecurity:    Worry: Not on file    Inability: Not on  file  . Transportation needs:    Medical: Not on file    Non-medical: Not on file  Tobacco Use  . Smoking status: Current Every Day Smoker    Packs/day: 0.50    Years: 41.00    Pack years: 20.50    Types: Cigarettes    Last attempt to quit: 04/26/2017    Years since quitting: 0.5  . Smokeless tobacco: Never Used  Substance and Sexual Activity  . Alcohol use: No    Alcohol/week: 0.0 oz  . Drug use: No  . Sexual activity: Not on file  Lifestyle  . Physical activity:    Days per week: Not on file    Minutes per session: Not on file  . Stress: Not on file  Relationships  . Social connections:    Talks on phone: Not on file    Gets together: Not on file    Attends religious service: Not on file    Active member of club or organization: Not on file    Attends meetings of clubs or organizations: Not on file    Relationship status: Not on file  Other Topics Concern  . Not on file  Social History Narrative   Lives with boyfriend   Caffeine use: Drinks 1 soda a day    Outpatient Encounter Medications as of 11/15/2017  Medication Sig  . ACCU-CHEK AVIVA PLUS test strip USE TWICE DAILY TO CHECK SUGAR  . acetaminophen (TYLENOL) 500 MG tablet Take 1,000 mg by mouth every 6 (six) hours as needed for mild pain.  Marland Kitchen albuterol (PROVENTIL) (2.5 MG/3ML) 0.083% nebulizer solution Take 3 mLs (2.5 mg total) by nebulization every 6 (six) hours as needed for wheezing.  Marland Kitchen amitriptyline (ELAVIL) 25 MG tablet Take 25 mg by mouth at bedtime.  Marland Kitchen atorvastatin (LIPITOR) 20 MG tablet TAKE ONE TABLET BY MOUTH DAILY  . Blood Glucose Monitoring Suppl (ACCU-CHEK AVIVA) device Use as instructed  . buPROPion (WELLBUTRIN SR) 150 MG 12 hr tablet Take 150 mg by mouth 2 (two) times daily.  . cetirizine (ZYRTEC) 10 MG tablet Take 10 mg by mouth daily.  . citalopram (CELEXA) 10 MG tablet Take 20 mg daily by mouth.   . clonazePAM (KLONOPIN) 0.5 MG tablet Take 0.25-0.5 mg by mouth every 8 (eight) hours as needed for  anxiety. for anxiety  . ferrous sulfate 325 (65 FE) MG tablet Take 325 mg by mouth 2 (two) times daily with a meal.  . gabapentin (NEURONTIN) 300 MG capsule Take 300 mg by mouth 2 (two) times daily.  Marland Kitchen glucose blood (ACCU-CHEK AVIVA) test strip To test 4 times a day  . hydrochlorothiazide (MICROZIDE) 12.5 MG capsule Take 12.5 mg by mouth daily.  . Insulin Glargine (TOUJEO SOLOSTAR) 300 UNIT/ML SOPN Inject 70 Units into the skin at bedtime.  Marland Kitchen lisinopril (PRINIVIL,ZESTRIL) 20 MG tablet Take 20 mg by mouth daily.  . meloxicam (MOBIC) 15 MG tablet Take 15 mg by mouth daily.  . metFORMIN (GLUCOPHAGE) 850 MG tablet Take 850 mg by mouth 2 (two) times daily with a meal.  . montelukast (SINGULAIR) 10 MG tablet Take 10 mg by mouth at bedtime.  Marland Kitchen omeprazole (PRILOSEC) 20 MG capsule Take 20 mg by mouth daily.  Marland Kitchen PROAIR HFA 108 (90 Base) MCG/ACT inhaler Inhale 2 puffs into the lungs every 4 (four) hours as needed for wheezing or shortness of breath.   Marland Kitchen rOPINIRole (REQUIP) 0.25 MG tablet Take 0.25 mg by mouth every evening.   Marland Kitchen ULTICARE MINI PEN NEEDLES 31G X 6 MM MISC USE AS DIRECTED ONCE DAILY.   No facility-administered encounter medications on file as of 11/15/2017.    ALLERGIES: No Known Allergies VACCINATION STATUS:  There is no immunization history on file for this patient.  Diabetes  She presents for her follow-up diabetic visit. She has type 2 diabetes mellitus. Onset time: She was diagnosed at approximate age of 44 years. Her disease course has been stable. There are no hypoglycemic associated symptoms. Pertinent negatives for hypoglycemia include no confusion, headaches, pallor or seizures. Associated symptoms include blurred vision and fatigue. Pertinent negatives for diabetes include no chest pain and no polyphagia. There are no hypoglycemic complications. Symptoms are improving. Diabetic complications include a CVA. (She is a chronic heavy smoker.) Risk factors for coronary artery disease  include diabetes mellitus, dyslipidemia, hypertension, obesity, sedentary lifestyle and tobacco exposure. Current diabetic treatment includes oral agent (monotherapy). Her weight is stable. She is following a generally unhealthy diet. When asked about meal planning, she reported none. She rarely participates in exercise. Her breakfast blood glucose range is generally 140-180 mg/dl. Her overall blood glucose range is 140-180 mg/dl. An ACE inhibitor/angiotensin II receptor blocker is being taken. Eye exam is current.  Hyperlipidemia  This is a chronic problem. The current episode started more than 1 year ago. The problem is controlled. Recent  lipid tests were reviewed and are high. Exacerbating diseases include diabetes and obesity. Associated symptoms include shortness of breath. Pertinent negatives include no chest pain or myalgias. Current antihyperlipidemic treatment includes statins. Risk factors for coronary artery disease include diabetes mellitus, dyslipidemia, hypertension, a sedentary lifestyle, obesity and post-menopausal.  Hypertension  This is a chronic problem. The current episode started more than 1 year ago. The problem is controlled. Associated symptoms include blurred vision and shortness of breath. Pertinent negatives include no chest pain, headaches or palpitations. Risk factors for coronary artery disease include diabetes mellitus, dyslipidemia, sedentary lifestyle, smoking/tobacco exposure, post-menopausal state, family history and obesity. Past treatments include ACE inhibitors. Hypertensive end-organ damage includes CVA.     Review of Systems  Constitutional: Positive for fatigue. Negative for activity change, chills, fever and unexpected weight change.  HENT: Negative for trouble swallowing and voice change.   Eyes: Positive for blurred vision. Negative for visual disturbance.  Respiratory: Positive for cough and shortness of breath. Negative for wheezing.   Cardiovascular:  Negative for chest pain, palpitations and leg swelling.  Gastrointestinal: Negative for diarrhea, nausea and vomiting.  Endocrine: Negative for cold intolerance, heat intolerance and polyphagia.  Genitourinary: Negative for dysuria, flank pain and frequency.  Musculoskeletal: Negative for arthralgias and myalgias.  Skin: Negative for color change, pallor, rash and wound.  Neurological: Negative for seizures and headaches.  Psychiatric/Behavioral: Negative for confusion, dysphoric mood and suicidal ideas.  All other systems reviewed and are negative.   Objective:    BP 132/76   Pulse 87   Ht  (1.549 m)   Wt 207 lb (93.9 kg)   BMI 39.11 kg/m   Wt Readings from Last 3 Encounters:  11/15/17 207 lb (93.9 kg)  08/16/17 209 lb (94.8 kg)  05/10/17 210 lb (95.3 kg)    Physical Exam  Constitutional: She is oriented to person, place, and time. She appears well-developed.  HENT:  Head: Normocephalic and atraumatic.  Eyes: EOM are normal.  Neck: Normal range of motion. Neck supple. No tracheal deviation present. No thyromegaly present.  Cardiovascular: Normal rate.  Pulmonary/Chest: Effort normal. She has wheezes.  Abdominal: Soft. Bowel sounds are normal. There is no tenderness. There is no guarding.  Musculoskeletal: Normal range of motion. She exhibits no edema.  Neurological: She is alert and oriented to person, place, and time. She has normal reflexes. No cranial nerve deficit. Coordination normal.  Skin: Skin is warm and dry. No rash noted. No erythema. No pallor.  Psychiatric:  Hesitant affect.     CMP ( most recent) CMP     Component Value Date/Time   NA 135 01/18/2016 1630   NA 140 11/24/2015 1624   K 4.7 01/18/2016 1630   CL 98 (L) 01/18/2016 1630   CO2 27 01/18/2016 1630   GLUCOSE 245 (H) 01/18/2016 1630   BUN 23 (A) 11/09/2017   CREATININE 0.7 11/09/2017   CREATININE 0.63 01/18/2016 1630   CALCIUM 9.6 01/18/2016 1630   PROT 6.6 11/24/2015 1624   ALBUMIN 4.2  11/24/2015 1624   AST 25 11/24/2015 1624   ALT 24 11/24/2015 1624   ALKPHOS 79 11/24/2015 1624   BILITOT 0.4 11/24/2015 1624   GFRNONAA >60 01/18/2016 1630   GFRAA >60 01/18/2016 1630     Diabetic Labs (most recent): Lab Results  Component Value Date   HGBA1C 6.8 11/09/2017   HGBA1C 8 08/09/2017   HGBA1C 8 05/02/2017   Lipid Panel     Component Value Date/Time  CHOL 145 08/09/2017   TRIG 167 (A) 08/09/2017   HDL 40 08/09/2017   LDLCALC 72 08/09/2017       Assessment & Plan:   1. Uncontrolled type 2 diabetes mellitus with other circulatory complication, without long-term current use of insulin (HCC)   - Patient has currently improving  type 2 DM since  62 years of age. -She returns with A1c of 6.8%, generally improving from 10.2%.    - Her fasting blood glucose profile is considered controlled to near target.  - Recent labs reviewed.   Her diabetes is complicated by TIA, chronic heavy smoking and patient remains at a high risk for more acute and chronic complications of diabetes which include CAD, CVA, CKD, retinopathy, and neuropathy. These are all discussed in detail with the patient.  - I have counseled the patient on diet management and weight loss, by adopting a carbohydrate restricted/protein rich diet.  -  Suggestion is made for her to avoid simple carbohydrates  from her diet including Cakes, Sweet Desserts / Pastries, Ice Cream, Soda (diet and regular), Sweet Tea, Candies, Chips, Cookies, Store Bought Juices, Alcohol in Excess of  1-2 drinks a day, Artificial Sweeteners, and "Sugar-free" Products. This will help patient to have stable blood glucose profile and potentially avoid unintended weight gain.   - I encouraged the patient to switch to  unprocessed or minimally processed complex starch and increased protein intake (animal or plant source), fruits, and vegetables.  - Patient is advised to stick to a routine mealtimes to eat 3 meals  a day and avoid  unnecessary snacks ( to snack only to correct hypoglycemia).   - I have approached patient with the following individualized plan to manage diabetes and patient agrees:    - I advised her to continue basal insulin Toujeo at 70  units daily at bedtime, associated with monitoring of blood glucose before breakfast and at bedtime.  -Patient is encouraged to call clinic for blood glucose levels less than 70 or above 200 mg /dl.  - I will continue metformin 850 mg by mouth twice a day, therapeutically suitable for patient.  - Patient specific target  A1c;  LDL, HDL, Triglycerides, and  Waist Circumference were discussed in detail.  2) BP/HTN: Her blood pressure is controlled to target.  I advised her to continue her current blood pressure medications including lisinopril 20 mg p.o. daily.    3) Lipids/HPL:  Controlled with LDL 72 improving from 99, triglycerides improving to 167 from 213, I advised her to continue  atorvastatin 20 mg by mouth daily at bedtime.   4)  Weight/Diet:  She did not have any further success in weight loss, she still admits significant  dietary indiscretion, CDE Consult  Is in progress  , exercise, and detailed carbohydrates information provided.  5) Chronic Care/Health Maintenance:  -Patient is on ACEI and Statin medications and encouraged to continue to follow up with Ophthalmology, Podiatrist at least yearly or according to recommendations, and advised to  quit smoking. I have recommended yearly flu vaccine and pneumonia vaccination at least every 5 years; moderate intensity exercise for up to 150 minutes weekly; and  sleep for at least 7 hours a day.  - I advised patient to maintain close follow up with Burdine, Ananias Pilgrim, MD for primary care needs.  - Time spent with the patient: 25 min, of which >50% was spent in reviewing her blood glucose logs , discussing her hypo- and hyper-glycemic episodes, reviewing her current and  previous labs and insulin doses and developing  a plan to avoid hypo- and hyper-glycemia. Please refer to Patient Instructions for Blood Glucose Monitoring and Insulin/Medications Dosing Guide"  in media tab for additional information. Victoria Lara participated in the discussions, expressed understanding, and voiced agreement with the above plans.  All questions were answered to her satisfaction. she is encouraged to contact clinic should she have any questions or concerns prior to her return visit.    Follow up plan: - Return in about 3 months (around 02/15/2018) for meter, and logs, follow up with pre-visit labs, meter, and logs.  Marquis Lunch, MD Phone: [phone]  Fax: [phone]   -  This note was partially dictated with voice recognition software. Similar sounding words can be transcribed inadequately or may not  be corrected upon review.  11/15/2017, 9:59 AM

## 2017-12-01 ENCOUNTER — Other Ambulatory Visit: Payer: Self-pay | Admitting: "Endocrinology

## 2017-12-08 DIAGNOSIS — J449 Chronic obstructive pulmonary disease, unspecified: Secondary | ICD-10-CM | POA: Diagnosis not present

## 2018-01-07 DIAGNOSIS — J449 Chronic obstructive pulmonary disease, unspecified: Secondary | ICD-10-CM | POA: Diagnosis not present

## 2018-01-29 DIAGNOSIS — M722 Plantar fascial fibromatosis: Secondary | ICD-10-CM | POA: Diagnosis not present

## 2018-01-29 DIAGNOSIS — M7672 Peroneal tendinitis, left leg: Secondary | ICD-10-CM | POA: Diagnosis not present

## 2018-02-01 ENCOUNTER — Encounter: Payer: Self-pay | Admitting: "Endocrinology

## 2018-02-07 DIAGNOSIS — J449 Chronic obstructive pulmonary disease, unspecified: Secondary | ICD-10-CM | POA: Diagnosis not present

## 2018-02-12 ENCOUNTER — Other Ambulatory Visit: Payer: Self-pay

## 2018-02-12 ENCOUNTER — Telehealth: Payer: Self-pay | Admitting: "Endocrinology

## 2018-02-12 MED ORDER — INSULIN GLARGINE 300 UNIT/ML ~~LOC~~ SOPN: 70.0000 [IU] | PEN_INJECTOR | Freq: Every day | SUBCUTANEOUS | 2 refills | Status: DC

## 2018-02-12 NOTE — Telephone Encounter (Signed)
Rx sent 

## 2018-02-12 NOTE — Telephone Encounter (Signed)
Refill on Insulin Glargine (TOUJEO SOLOSTAR) 300 UNIT/ML SOPN

## 2018-02-18 ENCOUNTER — Ambulatory Visit: Payer: Self-pay | Admitting: "Endocrinology

## 2018-02-18 ENCOUNTER — Telehealth: Payer: Self-pay | Admitting: "Endocrinology

## 2018-02-18 NOTE — Telephone Encounter (Signed)
Victoria Lara is calling asking for sample of Insulin Glargine (TOUJEO SOLOSTAR) 300 UNIT/ML SOPN please advise?

## 2018-02-19 MED ORDER — INSULIN GLARGINE 300 UNIT/ML ~~LOC~~ SOPN: 70.0000 [IU] | PEN_INJECTOR | Freq: Every day | SUBCUTANEOUS | 2 refills | Status: DC

## 2018-02-26 DIAGNOSIS — M722 Plantar fascial fibromatosis: Secondary | ICD-10-CM | POA: Diagnosis not present

## 2018-02-26 DIAGNOSIS — M7672 Peroneal tendinitis, left leg: Secondary | ICD-10-CM | POA: Diagnosis not present

## 2018-03-04 ENCOUNTER — Ambulatory Visit: Payer: Self-pay | Admitting: "Endocrinology

## 2018-03-10 DIAGNOSIS — J449 Chronic obstructive pulmonary disease, unspecified: Secondary | ICD-10-CM | POA: Diagnosis not present

## 2018-03-21 DIAGNOSIS — E7800 Pure hypercholesterolemia, unspecified: Secondary | ICD-10-CM | POA: Diagnosis not present

## 2018-03-21 DIAGNOSIS — E1165 Type 2 diabetes mellitus with hyperglycemia: Secondary | ICD-10-CM | POA: Diagnosis not present

## 2018-03-21 DIAGNOSIS — I10 Essential (primary) hypertension: Secondary | ICD-10-CM | POA: Diagnosis not present

## 2018-03-21 LAB — HEMOGLOBIN A1C: Hemoglobin A1C: 6.9

## 2018-03-21 LAB — LIPID PANEL
CHOLESTEROL: 115 (ref 0–200)
HDL: 31 — AB (ref 35–70)
LDL Cholesterol: 60
Triglycerides: 120 (ref 40–160)

## 2018-03-21 LAB — BASIC METABOLIC PANEL
BUN: 24 — AB (ref 4–21)
Creatinine: 0.8 (ref 0.5–1.1)

## 2018-03-22 DIAGNOSIS — Z23 Encounter for immunization: Secondary | ICD-10-CM | POA: Diagnosis not present

## 2018-03-25 ENCOUNTER — Encounter: Payer: Self-pay | Admitting: "Endocrinology

## 2018-03-25 ENCOUNTER — Ambulatory Visit (INDEPENDENT_AMBULATORY_CARE_PROVIDER_SITE_OTHER): Payer: Medicare Other | Admitting: "Endocrinology

## 2018-03-25 VITALS — BP 140/83 | HR 83 | Ht 61.0 in | Wt 208.0 lb

## 2018-03-25 DIAGNOSIS — E1169 Type 2 diabetes mellitus with other specified complication: Secondary | ICD-10-CM

## 2018-03-25 DIAGNOSIS — E669 Obesity, unspecified: Secondary | ICD-10-CM

## 2018-03-25 DIAGNOSIS — I10 Essential (primary) hypertension: Secondary | ICD-10-CM | POA: Diagnosis not present

## 2018-03-25 DIAGNOSIS — E782 Mixed hyperlipidemia: Secondary | ICD-10-CM | POA: Diagnosis not present

## 2018-03-25 NOTE — Patient Instructions (Signed)

## 2018-03-25 NOTE — Progress Notes (Signed)
Endocrinology follow-up note    Subjective:    Patient ID: Victoria Lara, female    DOB: 02/10/1956. Patient is being seen in f/u for management of symptomatic type 2 diabetes, hyperlipidemia, hypertension. PMD:   Juliette Alcide, MD  Past Medical History:  Diagnosis Date  . Anemia   . Anxiety   . Arthritis   . Bronchitis   . Complication of anesthesia    had to come back to ER after shoulder surgery at [facility]; hard to wake up; low O2 sats  . Depression   . Diabetes mellitus without complication (HCC)   . GERD (gastroesophageal reflux disease)   . Hyperlipidemia   . Hypertension   . Neuropathy    Takes Gabapentin  . Pneumonia 2016  . Restless leg syndrome   . Seasonal allergies   . Shortness of breath dyspnea   . Syncope   . TIA (transient ischemic attack) 2006   Past Surgical History:  Procedure Laterality Date  . BREAST LUMPECTOMY Bilateral   . CATARACT EXTRACTION W/PHACO Right 08/20/2014   Procedure: CATARACT EXTRACTION PHACO AND INTRAOCULAR LENS PLACEMENT (IOC);  Surgeon: Gemma Payor, MD;  Location: [facility];  Service: Ophthalmology;  Laterality: Right;  CDE:5.31  . HAND SURGERY Bilateral   . NASAL SEPTOPLASTY W/ TURBINOPLASTY Bilateral 01/26/2016   Procedure: NASAL SEPTOPLASTY WITH BILATERAL TURBINATE REDUCTION;  Surgeon: Newman Pies, MD;  Location: [facility];  Service: ENT;  Laterality: Bilateral;  . NASAL SEPTUM SURGERY  01/26/2016  . NASAL SINUS SURGERY Bilateral 01/26/2016   Procedure: BILATERAL ENDOSCOPIC MAXILLARY ANTROSTOMY;  Surgeon: Newman Pies, MD;  Location: [facility];  Service: ENT;  Laterality: Bilateral;  . SHOULDER ARTHROSCOPY WITH ROTATOR CUFF REPAIR Right   . TUBAL LIGATION     Social History   Socioeconomic History  . Marital status: Widowed    Spouse name: Not on file  . Number of children: 1  . Years of education: 46  . Highest education level: Not on file  Occupational History  . Occupation: Unemployed  Social Needs  . Financial resource strain:  Not on file  . Food insecurity:    Worry: Not on file    Inability: Not on file  . Transportation needs:    Medical: Not on file    Non-medical: Not on file  Tobacco Use  . Smoking status: Current Every Day Smoker    Packs/day: 0.50    Years: 41.00    Pack years: 20.50    Types: Cigarettes    Last attempt to quit: 04/26/2017    Years since quitting: 0.9  . Smokeless tobacco: Never Used  Substance and Sexual Activity  . Alcohol use: No    Alcohol/week: 0.0 standard drinks  . Drug use: No  . Sexual activity: Not on file  Lifestyle  . Physical activity:    Days per week: Not on file    Minutes per session: Not on file  . Stress: Not on file  Relationships  . Social connections:    Talks on phone: Not on file    Gets together: Not on file    Attends religious service: Not on file    Active member of club or organization: Not on file    Attends meetings of clubs or organizations: Not on file    Relationship status: Not on file  Other Topics Concern  . Not on file  Social History Narrative   Lives with boyfriend   Caffeine use: Drinks 1 soda a day  Outpatient Encounter Medications as of 03/25/2018  Medication Sig  . ACCU-CHEK AVIVA PLUS test strip USE TWICE DAILY TO CHECK SUGAR  . acetaminophen (TYLENOL) 500 MG tablet Take 1,000 mg by mouth every 6 (six) hours as needed for mild pain.  Marland Kitchen albuterol (PROVENTIL) (2.5 MG/3ML) 0.083% nebulizer solution Take 3 mLs (2.5 mg total) by nebulization every 6 (six) hours as needed for wheezing.  Marland Kitchen amitriptyline (ELAVIL) 25 MG tablet Take 25 mg by mouth at bedtime.  Marland Kitchen atorvastatin (LIPITOR) 20 MG tablet TAKE ONE TABLET BY MOUTH DAILY  . Blood Glucose Monitoring Suppl (ACCU-CHEK AVIVA) device Use as instructed  . buPROPion (WELLBUTRIN SR) 150 MG 12 hr tablet Take 150 mg by mouth 2 (two) times daily.  . cetirizine (ZYRTEC) 10 MG tablet Take 10 mg by mouth daily.  . citalopram (CELEXA) 10 MG tablet Take 20 mg daily by mouth.   .  clonazePAM (KLONOPIN) 0.5 MG tablet Take 0.25-0.5 mg by mouth every 8 (eight) hours as needed for anxiety. for anxiety  . ferrous sulfate 325 (65 FE) MG tablet Take 325 mg by mouth 2 (two) times daily with a meal.  . gabapentin (NEURONTIN) 300 MG capsule Take 300 mg by mouth 2 (two) times daily.  Marland Kitchen glucose blood (ACCU-CHEK AVIVA) test strip To test 4 times a day  . hydrochlorothiazide (MICROZIDE) 12.5 MG capsule Take 12.5 mg by mouth daily.  . Insulin Glargine (TOUJEO SOLOSTAR) 300 UNIT/ML SOPN Inject 70 Units into the skin at bedtime.  Marland Kitchen lisinopril (PRINIVIL,ZESTRIL) 20 MG tablet Take 20 mg by mouth daily.  . meloxicam (MOBIC) 15 MG tablet Take 15 mg by mouth daily.  . metFORMIN (GLUCOPHAGE) 850 MG tablet Take 850 mg by mouth 2 (two) times daily with a meal.  . montelukast (SINGULAIR) 10 MG tablet Take 10 mg by mouth at bedtime.  Marland Kitchen omeprazole (PRILOSEC) 20 MG capsule Take 20 mg by mouth daily.  Marland Kitchen PROAIR HFA 108 (90 Base) MCG/ACT inhaler Inhale 2 puffs into the lungs every 4 (four) hours as needed for wheezing or shortness of breath.   Marland Kitchen rOPINIRole (REQUIP) 0.25 MG tablet Take 0.25 mg by mouth every evening.   Marland Kitchen ULTICARE MINI PEN NEEDLES 31G X 6 MM MISC USE AS DIRECTED ONCE DAILY.   No facility-administered encounter medications on file as of 03/25/2018.    ALLERGIES: No Known Allergies VACCINATION STATUS:  There is no immunization history on file for this patient.  Diabetes  She presents for her follow-up diabetic visit. She has type 2 diabetes mellitus. Onset time: She was diagnosed at approximate age of 44 years. Her disease course has been stable. There are no hypoglycemic associated symptoms. Pertinent negatives for hypoglycemia include no confusion, headaches, pallor or seizures. Associated symptoms include blurred vision and fatigue. Pertinent negatives for diabetes include no chest pain and no polyphagia. There are no hypoglycemic complications. Symptoms are improving. Diabetic  complications include a CVA. (She is a chronic heavy smoker.) Risk factors for coronary artery disease include diabetes mellitus, dyslipidemia, hypertension, obesity, sedentary lifestyle and tobacco exposure. Current diabetic treatment includes oral agent (monotherapy). Her weight is fluctuating minimally. She is following a generally unhealthy diet. When asked about meal planning, she reported none. She rarely participates in exercise. Her breakfast blood glucose range is generally 140-180 mg/dl. Her overall blood glucose range is 140-180 mg/dl. An ACE inhibitor/angiotensin II receptor blocker is being taken. Eye exam is current.  Hyperlipidemia  This is a chronic problem. The current episode started more than  1 year ago. The problem is controlled. Recent lipid tests were reviewed and are high. Exacerbating diseases include diabetes and obesity. Associated symptoms include shortness of breath. Pertinent negatives include no chest pain or myalgias. Current antihyperlipidemic treatment includes statins. Risk factors for coronary artery disease include diabetes mellitus, dyslipidemia, hypertension, a sedentary lifestyle, obesity and post-menopausal.  Hypertension  This is a chronic problem. The current episode started more than 1 year ago. The problem is controlled. Associated symptoms include blurred vision and shortness of breath. Pertinent negatives include no chest pain, headaches or palpitations. Risk factors for coronary artery disease include diabetes mellitus, dyslipidemia, sedentary lifestyle, smoking/tobacco exposure, post-menopausal state, family history and obesity. Past treatments include ACE inhibitors. Hypertensive end-organ damage includes CVA.     Review of Systems  Constitutional: Positive for fatigue. Negative for activity change, chills, fever and unexpected weight change.  HENT: Negative for trouble swallowing and voice change.   Eyes: Positive for blurred vision. Negative for visual  disturbance.  Respiratory: Positive for cough and shortness of breath. Negative for wheezing.   Cardiovascular: Negative for chest pain, palpitations and leg swelling.  Gastrointestinal: Negative for diarrhea, nausea and vomiting.  Endocrine: Negative for cold intolerance, heat intolerance and polyphagia.  Genitourinary: Negative for dysuria, flank pain and frequency.  Musculoskeletal: Negative for arthralgias and myalgias.  Skin: Negative for color change, pallor, rash and wound.  Neurological: Negative for seizures and headaches.  Psychiatric/Behavioral: Negative for confusion, dysphoric mood and suicidal ideas.    Objective:    BP 140/83   Pulse 83   Ht 5\' 1"  (1.549 m)   Wt 208 lb (94.3 kg)   BMI 39.30 kg/m   Wt Readings from Last 3 Encounters:  03/25/18 208 lb (94.3 kg)  11/15/17 207 lb (93.9 kg)  08/16/17 209 lb (94.8 kg)    Physical Exam  Constitutional: She is oriented to person, place, and time. She appears well-developed.  HENT:  Head: Normocephalic and atraumatic.  Eyes: EOM are normal.  Neck: Normal range of motion. Neck supple. No tracheal deviation present. No thyromegaly present.  Cardiovascular: Normal rate.  Pulmonary/Chest: Effort normal. She has wheezes.  Abdominal: Soft. Bowel sounds are normal. There is no tenderness. There is no guarding.  Musculoskeletal: Normal range of motion. She exhibits no edema.  Neurological: She is alert and oriented to person, place, and time. She has normal reflexes. No cranial nerve deficit. Coordination normal.  Skin: Skin is warm and dry. No rash noted. No erythema. No pallor.  Psychiatric:  Hesitant affect.     CMP ( most recent) CMP     Component Value Date/Time   NA 135 01/18/2016 1630   NA 140 11/24/2015 1624   K 4.7 01/18/2016 1630   CL 98 (L) 01/18/2016 1630   CO2 27 01/18/2016 1630   GLUCOSE 245 (H) 01/18/2016 1630   BUN 24 (A) 03/21/2018   CREATININE 0.8 03/21/2018   CREATININE 0.63 01/18/2016 1630    CALCIUM 9.6 01/18/2016 1630   PROT 6.6 11/24/2015 1624   ALBUMIN 4.2 11/24/2015 1624   AST 25 11/24/2015 1624   ALT 24 11/24/2015 1624   ALKPHOS 79 11/24/2015 1624   BILITOT 0.4 11/24/2015 1624   GFRNONAA >60 01/18/2016 1630   GFRAA >60 01/18/2016 1630     Diabetic Labs (most recent): Lab Results  Component Value Date   HGBA1C 6.9 03/21/2018   HGBA1C 6.8 11/09/2017   HGBA1C 8 08/09/2017   Lipid Panel     Component Value Date/Time  CHOL 115 03/21/2018   TRIG 120 03/21/2018   HDL 31 (A) 03/21/2018   LDLCALC 60 03/21/2018       Assessment & Plan:   1. Uncontrolled type 2 diabetes mellitus with other circulatory complication, without long-term current use of insulin (HCC)   - Patient has currently improving  type 2 DM since  62 years of age. -She returns with proving glycemic profile and A1c of 6.9%, generally improving from 10.2%.    - Her fasting blood glucose profile is considered controlled to near target.  - Recent labs reviewed.   Her diabetes is complicated by TIA, chronic heavy smoking and patient remains at a high risk for more acute and chronic complications of diabetes which include CAD, CVA, CKD, retinopathy, and neuropathy. These are all discussed in detail with the patient.  - I have counseled the patient on diet management and weight loss, by adopting a carbohydrate restricted/protein rich diet.  -  Suggestion is made for her to avoid simple carbohydrates  from her diet including Cakes, Sweet Desserts / Pastries, Ice Cream, Soda (diet and regular), Sweet Tea, Candies, Chips, Cookies, Store Bought Juices, Alcohol in Excess of  1-2 drinks a day, Artificial Sweeteners, and "Sugar-free" Products. This will help patient to have stable blood glucose profile and potentially avoid unintended weight gain.  - I encouraged the patient to switch to  unprocessed or minimally processed complex starch and increased protein intake (animal or plant source), fruits, and  vegetables.  - Patient is advised to stick to a routine mealtimes to eat 3 meals  a day and avoid unnecessary snacks ( to snack only to correct hypoglycemia).   - I have approached patient with the following individualized plan to manage diabetes and patient agrees:    -Based on her presentation with controlled glycemia and A1c of 6.9%, she will not require prandial insulin for now.  -She is advised to continue Toujeo 70 units nightly,associated with monitoring of blood glucose before breakfast and at bedtime.  -Patient is encouraged to call clinic for blood glucose levels less than 70 or above 200 mg /dl.  - I will continue metformin 850 mg by mouth twice a day, therapeutically suitable for patient.  - Patient specific target  A1c;  LDL, HDL, Triglycerides, and  Waist Circumference were discussed in detail.  2) BP/HTN: Her blood pressure is controlled to target.  I advised her to continue her current blood pressure medications including lisinopril 20 mg p.o. daily.    3) Lipids/HPL: Her recent lipid panel showed controlled LDL at 60, improving from 99, triglycerides improving to 120 improving from 213, I advised her to continue  atorvastatin 20 mg by mouth daily at bedtime.   4)  Weight/Diet:  She did not have any further success in weight loss, she still admits significant  dietary indiscretion, CDE Consult  Is in progress  , exercise, and detailed carbohydrates information provided.  5) Chronic Care/Health Maintenance:  -Patient is on ACEI and Statin medications and encouraged to continue to follow up with Ophthalmology, Podiatrist at least yearly or according to recommendations, and advised to  quit smoking. I have recommended yearly flu vaccine and pneumonia vaccination at least every 5 years; moderate intensity exercise for up to 150 minutes weekly; and  sleep for at least 7 hours a day.  - I advised patient to maintain close follow up with Burdine, Ananias Pilgrim, MD for primary care  needs. - Time spent with the patient: 25 min, of which >  50% was spent in reviewing her blood glucose logs , discussing her hypo- and hyper-glycemic episodes, reviewing her current and  previous labs and insulin doses and developing a plan to avoid hypo- and hyper-glycemia. Please refer to Patient Instructions for Blood Glucose Monitoring and Insulin/Medications Dosing Guide"  in media tab for additional information. Lurlean Horns participated in the discussions, expressed understanding, and voiced agreement with the above plans.  All questions were answered to her satisfaction. she is encouraged to contact clinic should she have any questions or concerns prior to her return visit.    Follow up plan: - Return in about 4 months (around 07/25/2018) for Meter, and Logs.  Marquis Lunch, MD Phone: [phone]  Fax: [phone]   -  This note was partially dictated with voice recognition software. Similar sounding words can be transcribed inadequately or may not  be corrected upon review.  03/25/2018, 11:12 AM

## 2018-04-09 DIAGNOSIS — J449 Chronic obstructive pulmonary disease, unspecified: Secondary | ICD-10-CM | POA: Diagnosis not present

## 2018-04-10 ENCOUNTER — Other Ambulatory Visit: Payer: Self-pay | Admitting: "Endocrinology

## 2018-04-12 DIAGNOSIS — S46012A Strain of muscle(s) and tendon(s) of the rotator cuff of left shoulder, initial encounter: Secondary | ICD-10-CM | POA: Diagnosis not present

## 2018-04-12 DIAGNOSIS — Z720 Tobacco use: Secondary | ICD-10-CM | POA: Diagnosis not present

## 2018-04-12 DIAGNOSIS — E1165 Type 2 diabetes mellitus with hyperglycemia: Secondary | ICD-10-CM | POA: Diagnosis not present

## 2018-04-23 DIAGNOSIS — E7800 Pure hypercholesterolemia, unspecified: Secondary | ICD-10-CM | POA: Diagnosis not present

## 2018-04-23 DIAGNOSIS — I10 Essential (primary) hypertension: Secondary | ICD-10-CM | POA: Diagnosis not present

## 2018-04-23 DIAGNOSIS — J441 Chronic obstructive pulmonary disease with (acute) exacerbation: Secondary | ICD-10-CM | POA: Diagnosis not present

## 2018-04-23 DIAGNOSIS — E1165 Type 2 diabetes mellitus with hyperglycemia: Secondary | ICD-10-CM | POA: Diagnosis not present

## 2018-04-25 DIAGNOSIS — E1165 Type 2 diabetes mellitus with hyperglycemia: Secondary | ICD-10-CM | POA: Diagnosis not present

## 2018-04-25 DIAGNOSIS — G2581 Restless legs syndrome: Secondary | ICD-10-CM | POA: Diagnosis not present

## 2018-04-25 DIAGNOSIS — I10 Essential (primary) hypertension: Secondary | ICD-10-CM | POA: Diagnosis not present

## 2018-04-25 DIAGNOSIS — S46012D Strain of muscle(s) and tendon(s) of the rotator cuff of left shoulder, subsequent encounter: Secondary | ICD-10-CM | POA: Diagnosis not present

## 2018-04-25 DIAGNOSIS — Z720 Tobacco use: Secondary | ICD-10-CM | POA: Diagnosis not present

## 2018-04-25 DIAGNOSIS — J984 Other disorders of lung: Secondary | ICD-10-CM | POA: Diagnosis not present

## 2018-05-02 DIAGNOSIS — J984 Other disorders of lung: Secondary | ICD-10-CM | POA: Diagnosis not present

## 2018-05-02 DIAGNOSIS — I700 Atherosclerosis of aorta: Secondary | ICD-10-CM | POA: Diagnosis not present

## 2018-05-02 DIAGNOSIS — J9811 Atelectasis: Secondary | ICD-10-CM | POA: Diagnosis not present

## 2018-05-02 DIAGNOSIS — R062 Wheezing: Secondary | ICD-10-CM | POA: Diagnosis not present

## 2018-05-09 ENCOUNTER — Ambulatory Visit (INDEPENDENT_AMBULATORY_CARE_PROVIDER_SITE_OTHER): Payer: Medicare Other | Admitting: Emergency Medicine

## 2018-05-09 ENCOUNTER — Encounter: Payer: Self-pay | Admitting: Emergency Medicine

## 2018-05-09 VITALS — BP 132/80 | HR 79 | Ht 60.0 in | Wt 212.0 lb

## 2018-05-09 DIAGNOSIS — J984 Other disorders of lung: Secondary | ICD-10-CM

## 2018-05-09 DIAGNOSIS — F17200 Nicotine dependence, unspecified, uncomplicated: Secondary | ICD-10-CM

## 2018-05-09 DIAGNOSIS — R05 Cough: Secondary | ICD-10-CM

## 2018-05-09 DIAGNOSIS — J449 Chronic obstructive pulmonary disease, unspecified: Secondary | ICD-10-CM

## 2018-05-09 DIAGNOSIS — G4733 Obstructive sleep apnea (adult) (pediatric): Secondary | ICD-10-CM

## 2018-05-09 DIAGNOSIS — R053 Chronic cough: Secondary | ICD-10-CM

## 2018-05-09 MED ORDER — VALSARTAN 160 MG PO TABS: 160.0000 mg | ORAL_TABLET | Freq: Every day | ORAL | 5 refills | Status: DC

## 2018-05-09 NOTE — Assessment & Plan Note (Signed)
She had a CT scan of her chest 05/02/2018 that does not show any evidence of interstitial lung disease.  I suspect that her restriction is principally due to obesity.  We discussed this today.

## 2018-05-09 NOTE — Patient Instructions (Addendum)
We will perform full pulmonary function testing to compare to your spirometry. Please continue your Zyrtec and Singulair as you are taking them. You may want to consider starting your fluticasone (Flonase) nasal spray every day, 2 sprays each nostril once daily. Please stop lisinopril for now. We will start valsartan 160 mg once daily as a substitute. Congratulations on decreasing your smoking.  We will discuss possibly setting a quit date at your next visit. We will need to revisit possible CPAP going forward. We will try to help you obtain.  Follow with Dr Delton CoombesByrum next available with full PFT

## 2018-05-09 NOTE — Progress Notes (Signed)
Subjective:    Patient ID: Victoria Lara, female    DOB: 1955/10/12, 62 y.o.   MRN: [id number]  HPI  62 year old smoker (40 pack years) with a history of obesity, diabetes, hypertension, GERD, anxiety/depression, seasonal allergies, restless leg syndrome on Requip.  She is referred today for evaluation of coughing, abnormal spirometry with restriction. She has been on albuterol for about a year, uses it pretty rarely, more so when her allergies are active. It does help her clear mucous, helps her breathing. Her functional capacity is pretty good, she would have to stop to rest with sustained exertion.   She apparently had an office spirometry and [facility] on 04/13/2018 that principally showed restriction.  PSG 05/2016 >> mod to severe OSA; she is not on CPAP due to the monthly cost associated with starting it  CT chest 05/02/18 >> no evidence ILD present, some lingular atx  Her minimizes her cough, but daughter-in-law states that it is pretty frequent. She has a lot of nasal gtt, sinus sx. She is on zyrtec, singulair, uses flonase in the Spring. Had sinus sgy 2016. She is on lisinopril. Occasional wheeze. No CP.     Review of Systems  Constitutional: Negative for fever and unexpected weight change.  HENT: Negative for congestion, dental problem, ear pain, nosebleeds, postnasal drip, rhinorrhea, sinus pressure, sneezing, sore throat and trouble swallowing.   Eyes: Negative for redness and itching.  Respiratory: Positive for cough, shortness of breath and wheezing. Negative for chest tightness.   Cardiovascular: Negative for palpitations and leg swelling.  Gastrointestinal: Negative for nausea and vomiting.  Genitourinary: Negative for dysuria.  Musculoskeletal: Negative for joint swelling.  Skin: Negative for rash.  Neurological: Negative for headaches.  Hematological: Does not bruise/bleed easily.  Psychiatric/Behavioral: Negative for  dysphoric mood. The patient is not nervous/anxious.    Past Medical History:  Diagnosis Date  . Anemia   . Anxiety   . Arthritis   . Bronchitis   . Complication of anesthesia    had to come back to ER after shoulder surgery at [facility]; hard to wake up; low O2 sats  . Depression   . Diabetes mellitus without complication (HCC)   . GERD (gastroesophageal reflux disease)   . Hyperlipidemia   . Hypertension   . Neuropathy    Takes Gabapentin  . Pneumonia 2016  . Restless leg syndrome   . Seasonal allergies   . Shortness of breath dyspnea   . Syncope   . TIA (transient ischemic attack) 2006     Family History  Problem Relation Age of Onset  . Diabetes Unknown   . Angina Unknown   . Seizures Neg Hx      Social History   Socioeconomic History  . Marital status: Widowed    Spouse name: Not on file  . Number of children: 1  . Years of education: 60  . Highest education level: Not on file  Occupational History  . Occupation: Unemployed  Social Needs  . Financial resource strain: Not on file  . Food insecurity:    Worry: Not on file    Inability: Not on file  . Transportation needs:    Medical: Not on file    Non-medical: Not on file  Tobacco Use  . Smoking status: Current Every Day Smoker    Packs/day: 0.10    Years: 44.00    Pack years: 4.40    Types: Cigarettes  . Smokeless tobacco: Never  Used  Substance and Sexual Activity  . Alcohol use: No    Alcohol/week: 0.0 standard drinks  . Drug use: No  . Sexual activity: Not on file  Lifestyle  . Physical activity:    Days per week: Not on file    Minutes per session: Not on file  . Stress: Not on file  Relationships  . Social connections:    Talks on phone: Not on file    Gets together: Not on file    Attends religious service: Not on file    Active member of club or organization: Not on file    Attends meetings of clubs or organizations: Not on file    Relationship status: Not on file  . Intimate partner  violence:    Fear of current or ex partner: Not on file    Emotionally abused: Not on file    Physically abused: Not on file    Forced sexual activity: Not on file  Other Topics Concern  . Not on file  Social History Narrative   Lives with boyfriend   Caffeine use: Drinks 1 soda a day      No Known Allergies   Outpatient Medications Prior to Visit  Medication Sig Dispense Refill  . ACCU-CHEK AVIVA PLUS test strip USE TWICE DAILY TO CHECK SUGAR 100 each 5  . albuterol (PROVENTIL) (2.5 MG/3ML) 0.083% nebulizer solution Take 3 mLs (2.5 mg total) by nebulization every 6 (six) hours as needed for wheezing. 75 mL 12  . amitriptyline (ELAVIL) 25 MG tablet Take 25 mg by mouth at bedtime.    Marland Kitchen. aspirin EC 81 MG tablet Take 81 mg by mouth daily.    Marland Kitchen. atorvastatin (LIPITOR) 20 MG tablet TAKE ONE TABLET BY MOUTH DAILY 30 tablet 2  . Blood Glucose Monitoring Suppl (ACCU-CHEK AVIVA) device Use as instructed 1 each 0  . buPROPion (WELLBUTRIN SR) 150 MG 12 hr tablet Take 150 mg by mouth 2 (two) times daily.  1  . cetirizine (ZYRTEC) 10 MG tablet Take 10 mg by mouth daily.    . citalopram (CELEXA) 10 MG tablet Take 20 mg daily by mouth.   6  . clonazePAM (KLONOPIN) 0.5 MG tablet Take 0.25-0.5 mg by mouth every 8 (eight) hours as needed for anxiety. for anxiety  3  . ferrous sulfate 325 (65 FE) MG tablet Take 325 mg by mouth 2 (two) times daily with a meal.    . gabapentin (NEURONTIN) 300 MG capsule Take 300 mg by mouth 2 (two) times daily.    Marland Kitchen. glucose blood (ACCU-CHEK AVIVA) test strip To test 4 times a day 150 each 3  . hydrochlorothiazide (MICROZIDE) 12.5 MG capsule Take 12.5 mg by mouth daily.    . Insulin Glargine (TOUJEO SOLOSTAR) 300 UNIT/ML SOPN Inject 70 Units into the skin at bedtime. 9 mL 2  . lisinopril (PRINIVIL,ZESTRIL) 20 MG tablet Take 20 mg by mouth daily.    . metFORMIN (GLUCOPHAGE) 850 MG tablet Take 850 mg by mouth 2 (two) times daily with a meal.    . montelukast (SINGULAIR) 10 MG  tablet Take 10 mg by mouth at bedtime.  4  . omeprazole (PRILOSEC) 20 MG capsule Take 20 mg by mouth daily.    Marland Kitchen. PROAIR HFA 108 (90 Base) MCG/ACT inhaler Inhale 2 puffs into the lungs every 4 (four) hours as needed for wheezing or shortness of breath.   0  . rOPINIRole (REQUIP) 0.25 MG tablet Take 0.25 mg by mouth every  evening.     Marland Kitchen ULTICARE MINI PEN NEEDLES 31G X 6 MM MISC USE AS DIRECTED ONCE DAILY. 100 each 5  . acetaminophen (TYLENOL) 500 MG tablet Take 1,000 mg by mouth every 6 (six) hours as needed for mild pain.    . meloxicam (MOBIC) 15 MG tablet Take 15 mg by mouth daily.     No facility-administered medications prior to visit.         Objective:   Physical Exam Vitals:   05/09/18 0859  BP: 132/80  Pulse: 79  SpO2: 94%  Weight: 212 lb (96.2 kg)  Height: 5' (1.524 m)   Gen: Pleasant, obese woman, in no distress,  normal affect  ENT: No lesions,  mouth clear,  oropharynx clear, no postnasal drip, slightly gravely voice  Neck: No JVD, no stridor  Lungs: No use of accessory muscles, no crackles or wheeze on normal breath. She does have wheeze on a forced exp  Cardiovascular: RRR, heart sounds normal, no murmur or gallops, no peripheral edema  Musculoskeletal: No deformities, no cyanosis or clubbing  Neuro: alert, non focal  Skin: Warm, no lesions or rash      Assessment & Plan:  COPD (chronic obstructive pulmonary disease) (HCC) Her spirometry showed restriction but based on her tobacco history, clinical symptoms I suspect she has mixed restriction and obstruction.  I like to perform formal pulmonary function testing and then based on this determine which schedule bronchodilator to initiate.  Restrictive lung disease She had a CT scan of her chest 05/02/2018 that does not show any evidence of interstitial lung disease.  I suspect that her restriction is principally due to obesity.  We discussed this today.  Chronic cough With probable contributions from her GERD  and also most notably allergic rhinitis which bothers her the most.  She is currently on Zyrtec and Singulair but has breakthrough symptoms.  I asked her to try starting her nasal steroid on a schedule instead of as needed to see if she gets more benefit.  Also she is on lisinopril and we will do a trial changing this to valsartan to see if this is helpful.  She will continue her omeprazole.  Smoker She has made progress and decreased her cigarettes significantly.  Discussed complete cessation with her.  She knows that she will need to set a quit date as we go forward.  We will try to help her in any way possible.  Obstructive sleep apnea Moderate to severe OSA noted on her polysomnogram from December 2017.  She is not on CPAP, never was able to obtain it principally due to the monthly cost.  We may need to look for other options, financial assistance to get her on therapy.  Suspect that her PSG data is still acceptable, may need a repeat split-night study  Levy Pupa, MD, PhD 05/09/2018, 1:21 PM [facility] [phone] or if no answer [phone]

## 2018-05-09 NOTE — Assessment & Plan Note (Signed)
Moderate to severe OSA noted on her polysomnogram from December 2017.  She is not on CPAP, never was able to obtain it principally due to the monthly cost.  We may need to look for other options, financial assistance to get her on therapy.  Suspect that her PSG data is still acceptable, may need a repeat split-night study

## 2018-05-09 NOTE — Assessment & Plan Note (Signed)
Her spirometry showed restriction but based on her tobacco history, clinical symptoms I suspect she has mixed restriction and obstruction.  I like to perform formal pulmonary function testing and then based on this determine which schedule bronchodilator to initiate.

## 2018-05-09 NOTE — Assessment & Plan Note (Signed)
She has made progress and decreased her cigarettes significantly.  Discussed complete cessation with her.  She knows that she will need to set a quit date as we go forward.  We will try to help her in any way possible.

## 2018-05-09 NOTE — Assessment & Plan Note (Signed)
With probable contributions from her GERD and also most notably allergic rhinitis which bothers her the most.  She is currently on Zyrtec and Singulair but has breakthrough symptoms.  I asked her to try starting her nasal steroid on a schedule instead of as needed to see if she gets more benefit.  Also she is on lisinopril and we will do a trial changing this to valsartan to see if this is helpful.  She will continue her omeprazole.

## 2018-05-10 DIAGNOSIS — J449 Chronic obstructive pulmonary disease, unspecified: Secondary | ICD-10-CM | POA: Diagnosis not present

## 2018-06-06 DIAGNOSIS — I10 Essential (primary) hypertension: Secondary | ICD-10-CM | POA: Diagnosis not present

## 2018-06-06 DIAGNOSIS — M48061 Spinal stenosis, lumbar region without neurogenic claudication: Secondary | ICD-10-CM | POA: Diagnosis not present

## 2018-06-06 DIAGNOSIS — J984 Other disorders of lung: Secondary | ICD-10-CM | POA: Diagnosis not present

## 2018-06-06 DIAGNOSIS — Z720 Tobacco use: Secondary | ICD-10-CM | POA: Diagnosis not present

## 2018-06-06 DIAGNOSIS — E1165 Type 2 diabetes mellitus with hyperglycemia: Secondary | ICD-10-CM | POA: Diagnosis not present

## 2018-06-09 DIAGNOSIS — J449 Chronic obstructive pulmonary disease, unspecified: Secondary | ICD-10-CM | POA: Diagnosis not present

## 2018-06-12 ENCOUNTER — Other Ambulatory Visit: Payer: Self-pay | Admitting: "Endocrinology

## 2018-06-21 ENCOUNTER — Other Ambulatory Visit: Payer: Self-pay | Admitting: "Endocrinology

## 2018-06-24 ENCOUNTER — Ambulatory Visit (INDEPENDENT_AMBULATORY_CARE_PROVIDER_SITE_OTHER): Payer: Medicare Other | Admitting: Emergency Medicine

## 2018-06-24 ENCOUNTER — Encounter: Payer: Self-pay | Admitting: Emergency Medicine

## 2018-06-24 DIAGNOSIS — F17200 Nicotine dependence, unspecified, uncomplicated: Secondary | ICD-10-CM

## 2018-06-24 DIAGNOSIS — J984 Other disorders of lung: Secondary | ICD-10-CM

## 2018-06-24 DIAGNOSIS — R053 Chronic cough: Secondary | ICD-10-CM

## 2018-06-24 DIAGNOSIS — J449 Chronic obstructive pulmonary disease, unspecified: Secondary | ICD-10-CM | POA: Diagnosis not present

## 2018-06-24 DIAGNOSIS — M5136 Other intervertebral disc degeneration, lumbar region: Secondary | ICD-10-CM | POA: Diagnosis not present

## 2018-06-24 DIAGNOSIS — R05 Cough: Secondary | ICD-10-CM

## 2018-06-24 DIAGNOSIS — G4733 Obstructive sleep apnea (adult) (pediatric): Secondary | ICD-10-CM

## 2018-06-24 LAB — PULMONARY FUNCTION TEST
DL/VA % PRED: 90 %
DL/VA: 3.85 ml/min/mmHg/L
DLCO UNC % PRED: 80 %
DLCO UNC: 15.11 ml/min/mmHg
FEF 25-75 POST: 1.08 L/s
FEF 25-75 Pre: 0.86 L/sec
FEF2575-%Change-Post: 26 %
FEF2575-%PRED-PRE: 43 %
FEF2575-%Pred-Post: 54 %
FEV1-%CHANGE-POST: 6 %
FEV1-%Pred-Post: 75 %
FEV1-%Pred-Pre: 70 %
FEV1-Post: 1.58 L
FEV1-Pre: 1.49 L
FEV1FVC-%Change-Post: 3 %
FEV1FVC-%PRED-PRE: 84 %
FEV6-%Change-Post: 2 %
FEV6-%PRED-POST: 87 %
FEV6-%Pred-Pre: 85 %
FEV6-POST: 2.3 L
FEV6-Pre: 2.24 L
FEV6FVC-%CHANGE-POST: 0 %
FEV6FVC-%PRED-POST: 102 %
FEV6FVC-%Pred-Pre: 102 %
FVC-%Change-Post: 2 %
FVC-%Pred-Post: 85 %
FVC-%Pred-Pre: 83 %
FVC-PRE: 2.29 L
FVC-Post: 2.33 L
POST FEV1/FVC RATIO: 68 %
PRE FEV6/FVC RATIO: 98 %
Post FEV6/FVC ratio: 98 %
Pre FEV1/FVC ratio: 65 %
RV % pred: 72 %
RV: 1.34 L
TLC % PRED: 83 %
TLC: 3.71 L

## 2018-06-24 MED ORDER — TIOTROPIUM BROMIDE MONOHYDRATE 2.5 MCG/ACT IN AERS: 2.0000 | INHALATION_SPRAY | Freq: Every day | RESPIRATORY_TRACT | 0 refills | Status: DC

## 2018-06-24 NOTE — Assessment & Plan Note (Signed)
Currently untreated but she is willing to retry.  The biggest barrier was financial in obtaining her CPAP originally from 2017.  There may be a way for us to get her support from a refurbished device more economically.  We will try to do this.  If we are able to obtain then we need to set her up on an auto set pressure 5-20

## 2018-06-24 NOTE — Progress Notes (Signed)
PFT done today. 

## 2018-06-24 NOTE — Assessment & Plan Note (Signed)
With contributions from allergic rhinitis, GERD, her COPD.  Also ACE inhibitor which we have changed.  She will continue the valsartan, adjust with her PCP based on its effectiveness.

## 2018-06-24 NOTE — Addendum Note (Signed)
Addended by: Maurene CapesPOTTS, Wilho Sharpley M on: 06/24/2018 11:19 AM   Modules accepted: Orders

## 2018-06-24 NOTE — Patient Instructions (Addendum)
Congratulations on decreasing your cigarettes.  Please work hard on trying to continue to cut down.  Our goal will be to stop altogether. We will do a trial of Spiriva Respimat, 2 puffs once daily.  Please call our office once you have completed the trial to let us know if you have benefited.  If so then we will order it through your pharmacy. Keep albuterol available to use either 2 puffs or 1 nebulizer treatment up to every 4 hours if needed for shortness of breath, chest tightness, wheezing. We will work on obtaining a refurbished CPAP device so that we can start you on nightly CPAP for your sleep apnea. Continue Zyrtec, Singulair We will plan to continue valsartan 160 mg daily.  This may need to be adjusted by Dr Leandrew KoyanagiBurdine going forward. Follow with Dr Delton CoombesByrum in 4 months or sooner if you have any problems.

## 2018-06-24 NOTE — Progress Notes (Signed)
Subjective:    Patient ID: Victoria Lara, female    DOB: [birth year], 62 y.o.   MRN: [id number]  HPI  62 year old smoker (40 pack years) with a history of obesity, diabetes, hypertension, GERD, anxiety/depression, seasonal allergies, restless leg syndrome on Requip.  She is referred today for evaluation of coughing, abnormal spirometry with restriction. She has been on albuterol for about a year, uses it pretty rarely, more so when her allergies are active. It does help her clear mucous, helps her breathing. Her functional capacity is pretty good, she would have to stop to rest with sustained exertion.   She apparently had an office spirometry and [facility]/2018 that principally showed restriction.  PSG 05/2016 >> mod to severe OSA; she is not on CPAP due to the monthly cost associated with starting it  CT chest 05/02/18 >> no evidence ILD present, some lingular atx  Her minimizes her cough, but daughter-in-law states that it is pretty frequent. She has a lot of nasal gtt, sinus sx. She is on zyrtec, singulair, uses flonase in the Spring. Had sinus sgy 2016. She is on lisinopril. Occasional wheeze. No CP.   ROV 06/24/18 --follow-up visit for 62 year old woman with a history of tobacco use, seasonal allergies, restless leg syndrome, untreated OSA.  Seen in follow-up for cough and evidence of restrictive lung disease on prior spirometry.  At her last visit I tried changing her lisinopril to valsartan, we discussed smoking cessation.  She was already on Zyrtec and Singulair.  Her cough is better, still has to clear mucous in the am.    We repeated her pulmonary function testing today and I have reviewed.  This shows evidence for moderate obstruction without a significant bronchodilator response, some probable superimposed restriction based on a decreased residual volume and a normal diffusion capacity.   Review of Systems  Constitutional:  Negative for fever and unexpected weight change.  HENT: Negative for congestion, dental problem, ear pain, nosebleeds, postnasal drip, rhinorrhea, sinus pressure, sneezing, sore throat and trouble swallowing.   Eyes: Negative for redness and itching.  Respiratory: Positive for cough, shortness of breath and wheezing. Negative for chest tightness.   Cardiovascular: Negative for palpitations and leg swelling.  Gastrointestinal: Negative for nausea and vomiting.  Genitourinary: Negative for dysuria.  Musculoskeletal: Negative for joint swelling.  Skin: Negative for rash.  Neurological: Negative for headaches.  Hematological: Does not bruise/bleed easily.  Psychiatric/Behavioral: Negative for dysphoric mood. The patient is not nervous/anxious.    Past Medical History:  Diagnosis Date  . Anemia   . Anxiety   . Arthritis   . Bronchitis   . Complication of anesthesia    had to come back to ER after shoulder surgery at [facility]; hard to wake up; low O2 sats  . Depression   . Diabetes mellitus without complication (HCC)   . GERD (gastroesophageal reflux disease)   . Hyperlipidemia   . Hypertension   . Neuropathy    Takes Gabapentin  . Pneumonia 2016  . Restless leg syndrome   . Seasonal allergies   . Shortness of breath dyspnea   . Syncope   . TIA (transient ischemic attack) 2006     Family History  Problem Relation Age of Onset  . Diabetes Unknown   . Angina Unknown   . Seizures Neg Hx      Social History   Socioeconomic History  . Marital status: Widowed    Spouse name: Not  on file  . Number of children: 1  . Years of education: 4  . Highest education level: Not on file  Occupational History  . Occupation: Unemployed  Social Needs  . Financial resource strain: Not on file  . Food insecurity:    Worry: Not on file    Inability: Not on file  . Transportation needs:    Medical: Not on file    Non-medical: Not on file  Tobacco Use  . Smoking status: Current Every  Day Smoker    Packs/day: 0.10    Years: 44.00    Pack years: 4.40    Types: Cigarettes  . Smokeless tobacco: Never Used  Substance and Sexual Activity  . Alcohol use: No    Alcohol/week: 0.0 standard drinks  . Drug use: No  . Sexual activity: Not on file  Lifestyle  . Physical activity:    Days per week: Not on file    Minutes per session: Not on file  . Stress: Not on file  Relationships  . Social connections:    Talks on phone: Not on file    Gets together: Not on file    Attends religious service: Not on file    Active member of club or organization: Not on file    Attends meetings of clubs or organizations: Not on file    Relationship status: Not on file  . Intimate partner violence:    Fear of current or ex partner: Not on file    Emotionally abused: Not on file    Physically abused: Not on file    Forced sexual activity: Not on file  Other Topics Concern  . Not on file  Social History Narrative   Lives with boyfriend   Caffeine use: Drinks 1 soda a day      No Known Allergies   Outpatient Medications Prior to Visit  Medication Sig Dispense Refill  . ACCU-CHEK AVIVA PLUS test strip USE TWICE DAILY TO CHECK SUGAR 100 each 5  . albuterol (PROVENTIL) (2.5 MG/3ML) 0.083% nebulizer solution Take 3 mLs (2.5 mg total) by nebulization every 6 (six) hours as needed for wheezing. 75 mL 12  . amitriptyline (ELAVIL) 25 MG tablet Take 25 mg by mouth at bedtime.    Marland Kitchen aspirin EC 81 MG tablet Take 81 mg by mouth daily.    Marland Kitchen atorvastatin (LIPITOR) 20 MG tablet TAKE ONE TABLET BY MOUTH DAILY 30 tablet 2  . Blood Glucose Monitoring Suppl (ACCU-CHEK AVIVA) device Use as instructed 1 each 0  . buPROPion (WELLBUTRIN SR) 150 MG 12 hr tablet Take 150 mg by mouth 2 (two) times daily.  1  . cetirizine (ZYRTEC) 10 MG tablet Take 10 mg by mouth daily.    . citalopram (CELEXA) 10 MG tablet Take 20 mg daily by mouth.   6  . clonazePAM (KLONOPIN) 0.5 MG tablet Take 0.25-0.5 mg by mouth every 8  (eight) hours as needed for anxiety. for anxiety  3  . ferrous sulfate 325 (65 FE) MG tablet Take 325 mg by mouth 2 (two) times daily with a meal.    . gabapentin (NEURONTIN) 300 MG capsule Take 300 mg by mouth 2 (two) times daily.    Marland Kitchen glucose blood (ACCU-CHEK AVIVA) test strip To test 4 times a day 150 each 3  . hydrochlorothiazide (MICROZIDE) 12.5 MG capsule Take 12.5 mg by mouth daily.    Marland Kitchen lisinopril (PRINIVIL,ZESTRIL) 20 MG tablet Take 20 mg by mouth daily.    Marland Kitchen  metFORMIN (GLUCOPHAGE) 850 MG tablet Take 850 mg by mouth 2 (two) times daily with a meal.    . montelukast (SINGULAIR) 10 MG tablet Take 10 mg by mouth at bedtime.  4  . omeprazole (PRILOSEC) 20 MG capsule Take 20 mg by mouth daily.    Marland Kitchen. PROAIR HFA 108 (90 Base) MCG/ACT inhaler Inhale 2 puffs into the lungs every 4 (four) hours as needed for wheezing or shortness of breath.   0  . rOPINIRole (REQUIP) 0.25 MG tablet Take 0.25 mg by mouth every evening.     Marland Kitchen. TOUJEO SOLOSTAR 300 UNIT/ML SOPN INJECT 70 UNITS INTO THE SKIN AT BEDTIME. 9 mL 2  . TRUEPLUS PEN NEEDLES 31G X 6 MM MISC USE AS DIRECTED ONCE DAILY. 100 each 5  . valsartan (DIOVAN) 160 MG tablet Take 1 tablet (160 mg total) by mouth daily. 30 tablet 5   No facility-administered medications prior to visit.         Objective:   Physical Exam Vitals:   06/24/18 1005  BP: 128/82  Pulse: 83  SpO2: 92%  Weight: 210 lb (95.3 kg)  Height: 5' (1.524 m)   Gen: Pleasant, obese woman, in no distress,  normal affect  ENT: No lesions,  mouth clear,  oropharynx clear, no postnasal drip, slightly gravely voice  Neck: No JVD, no stridor  Lungs: No use of accessory muscles, no crackles or wheeze on normal breath. She does have wheeze on a forced exp  Cardiovascular: RRR, heart sounds normal, no murmur or gallops, no peripheral edema  Musculoskeletal: No deformities, no cyanosis or clubbing  Neuro: alert, non focal  Skin: Warm, no lesions or rash      Assessment &  Plan:  COPD (chronic obstructive pulmonary disease) (HCC) History of tobacco and now obstruction confirmed on her pulmonary function testing today.  FEV1 1.49 L (70% predicted).  I think she may benefit from scheduled bronchodilator therapy and we will do a trial of Spiriva Respimat.  Keep your albuterol available.  Restrictive lung disease Multifactorial dyspnea with restriction as a component.  Principally due to obesity.  Discussed with her today  Obstructive sleep apnea Currently untreated but she is willing to retry.  The biggest barrier was financial in obtaining her CPAP originally from 2017.  There may be a way for us to get her support from a refurbished device more economically.  We will try to do this.  If we are able to obtain then we need to set her up on an auto set pressure 5-20  Chronic cough With contributions from allergic rhinitis, GERD, her COPD.  Also ACE inhibitor which we have changed.  She will continue the valsartan, adjust with her PCP based on its effectiveness.  Levy Pupaobert Abigial Newville, MD, PhD 06/24/2018, 10:30 AM  [facility] [phone] or if no answer [phone]

## 2018-06-24 NOTE — Assessment & Plan Note (Signed)
History of tobacco and now obstruction confirmed on her pulmonary function testing today.  FEV1 1.49 L (70% predicted).  I think she may benefit from scheduled bronchodilator therapy and we will do a trial of Spiriva Respimat.  Keep your albuterol available.

## 2018-06-24 NOTE — Progress Notes (Signed)
Patient seen in the office today and instructed on use of Spiriva 2.625mcg.  Patient expressed understanding and demonstrated technique.  Victoria LoronCherina Jarell Mcewen [facility] Hospital[facility]MA 06/24/18

## 2018-06-24 NOTE — Assessment & Plan Note (Signed)
Multifactorial dyspnea with restriction as a component.  Principally due to obesity.  Discussed with her today

## 2018-07-02 DIAGNOSIS — R262 Difficulty in walking, not elsewhere classified: Secondary | ICD-10-CM | POA: Diagnosis not present

## 2018-07-02 DIAGNOSIS — M545 Low back pain: Secondary | ICD-10-CM | POA: Diagnosis not present

## 2018-07-02 DIAGNOSIS — M48061 Spinal stenosis, lumbar region without neurogenic claudication: Secondary | ICD-10-CM | POA: Diagnosis not present

## 2018-07-05 DIAGNOSIS — R262 Difficulty in walking, not elsewhere classified: Secondary | ICD-10-CM | POA: Diagnosis not present

## 2018-07-05 DIAGNOSIS — M48061 Spinal stenosis, lumbar region without neurogenic claudication: Secondary | ICD-10-CM | POA: Diagnosis not present

## 2018-07-05 DIAGNOSIS — M545 Low back pain: Secondary | ICD-10-CM | POA: Diagnosis not present

## 2018-07-08 DIAGNOSIS — M545 Low back pain: Secondary | ICD-10-CM | POA: Diagnosis not present

## 2018-07-08 DIAGNOSIS — R262 Difficulty in walking, not elsewhere classified: Secondary | ICD-10-CM | POA: Diagnosis not present

## 2018-07-08 DIAGNOSIS — M48061 Spinal stenosis, lumbar region without neurogenic claudication: Secondary | ICD-10-CM | POA: Diagnosis not present

## 2018-07-10 DIAGNOSIS — J449 Chronic obstructive pulmonary disease, unspecified: Secondary | ICD-10-CM | POA: Diagnosis not present

## 2018-07-12 DIAGNOSIS — M545 Low back pain: Secondary | ICD-10-CM | POA: Diagnosis not present

## 2018-07-12 DIAGNOSIS — R262 Difficulty in walking, not elsewhere classified: Secondary | ICD-10-CM | POA: Diagnosis not present

## 2018-07-12 DIAGNOSIS — M48061 Spinal stenosis, lumbar region without neurogenic claudication: Secondary | ICD-10-CM | POA: Diagnosis not present

## 2018-07-15 DIAGNOSIS — M48061 Spinal stenosis, lumbar region without neurogenic claudication: Secondary | ICD-10-CM | POA: Diagnosis not present

## 2018-07-15 DIAGNOSIS — M545 Low back pain: Secondary | ICD-10-CM | POA: Diagnosis not present

## 2018-07-15 DIAGNOSIS — R262 Difficulty in walking, not elsewhere classified: Secondary | ICD-10-CM | POA: Diagnosis not present

## 2018-07-18 DIAGNOSIS — E1165 Type 2 diabetes mellitus with hyperglycemia: Secondary | ICD-10-CM | POA: Diagnosis not present

## 2018-07-18 LAB — HEMOGLOBIN A1C: Hemoglobin A1C: 7.2

## 2018-07-19 DIAGNOSIS — M48061 Spinal stenosis, lumbar region without neurogenic claudication: Secondary | ICD-10-CM | POA: Diagnosis not present

## 2018-07-19 DIAGNOSIS — R262 Difficulty in walking, not elsewhere classified: Secondary | ICD-10-CM | POA: Diagnosis not present

## 2018-07-19 DIAGNOSIS — M545 Low back pain: Secondary | ICD-10-CM | POA: Diagnosis not present

## 2018-07-22 DIAGNOSIS — R262 Difficulty in walking, not elsewhere classified: Secondary | ICD-10-CM | POA: Diagnosis not present

## 2018-07-22 DIAGNOSIS — M545 Low back pain: Secondary | ICD-10-CM | POA: Diagnosis not present

## 2018-07-22 DIAGNOSIS — M48061 Spinal stenosis, lumbar region without neurogenic claudication: Secondary | ICD-10-CM | POA: Diagnosis not present

## 2018-07-23 DIAGNOSIS — J984 Other disorders of lung: Secondary | ICD-10-CM | POA: Diagnosis not present

## 2018-07-23 DIAGNOSIS — I10 Essential (primary) hypertension: Secondary | ICD-10-CM | POA: Diagnosis not present

## 2018-07-23 DIAGNOSIS — E1165 Type 2 diabetes mellitus with hyperglycemia: Secondary | ICD-10-CM | POA: Diagnosis not present

## 2018-07-23 DIAGNOSIS — Z720 Tobacco use: Secondary | ICD-10-CM | POA: Diagnosis not present

## 2018-07-25 ENCOUNTER — Encounter: Payer: Self-pay | Admitting: "Endocrinology

## 2018-07-25 ENCOUNTER — Ambulatory Visit (INDEPENDENT_AMBULATORY_CARE_PROVIDER_SITE_OTHER): Payer: Medicare Other | Admitting: "Endocrinology

## 2018-07-25 VITALS — BP 115/61 | HR 80 | Ht 60.0 in | Wt 209.0 lb

## 2018-07-25 DIAGNOSIS — I10 Essential (primary) hypertension: Secondary | ICD-10-CM

## 2018-07-25 DIAGNOSIS — F17200 Nicotine dependence, unspecified, uncomplicated: Secondary | ICD-10-CM

## 2018-07-25 DIAGNOSIS — E782 Mixed hyperlipidemia: Secondary | ICD-10-CM

## 2018-07-25 DIAGNOSIS — E1169 Type 2 diabetes mellitus with other specified complication: Secondary | ICD-10-CM

## 2018-07-25 DIAGNOSIS — E669 Obesity, unspecified: Secondary | ICD-10-CM

## 2018-07-25 NOTE — Progress Notes (Signed)
Endocrinology follow-up note    Subjective:    Patient ID: Victoria Lara, female    DOB: [birth year]. Patient is being seen in f/u for management of symptomatic type 2 diabetes, hyperlipidemia, hypertension. PMD:   Juliette AlcideBurdine, Steven E, MD  Past Medical History:  Diagnosis Date  . Anemia   . Anxiety   . Arthritis   . Bronchitis   . Complication of anesthesia    had to come back to ER after shoulder surgery at [facility]; hard to wake up; low O2 sats  . Depression   . Diabetes mellitus without complication (HCC)   . GERD (gastroesophageal reflux disease)   . Hyperlipidemia   . Hypertension   . Neuropathy    Takes Gabapentin  . Pneumonia 2016  . Restless leg syndrome   . Seasonal allergies   . Shortness of breath dyspnea   . Syncope   . TIA (transient ischemic attack) 2006   Past Surgical History:  Procedure Laterality Date  . BREAST LUMPECTOMY Bilateral   . CATARACT EXTRACTION W/PHACO Right 08/20/2014   Procedure: CATARACT EXTRACTION PHACO AND INTRAOCULAR LENS PLACEMENT (IOC);  Surgeon: Gemma PayorKerry Hunt, MD;  Location: [facility];  Service: Ophthalmology;  Laterality: Right;  CDE:5.31  . HAND SURGERY Bilateral   . NASAL SEPTOPLASTY W/ TURBINOPLASTY Bilateral 01/26/2016   Procedure: NASAL SEPTOPLASTY WITH BILATERAL TURBINATE REDUCTION;  Surgeon: Newman PiesSu Teoh, MD;  Location: [facility];  Service: ENT;  Laterality: Bilateral;  . NASAL SEPTUM SURGERY  01/26/2016  . NASAL SINUS SURGERY Bilateral 01/26/2016   Procedure: BILATERAL ENDOSCOPIC MAXILLARY ANTROSTOMY;  Surgeon: Newman PiesSu Teoh, MD;  Location: [facility];  Service: ENT;  Laterality: Bilateral;  . SHOULDER ARTHROSCOPY WITH ROTATOR CUFF REPAIR Right   . TUBAL LIGATION     Social History   Socioeconomic History  . Marital status: Widowed    Spouse name: Not on file  . Number of children: 1  . Years of education: 7012  . Highest education level: Not on file  Occupational History  . Occupation: Unemployed  Social Needs  . Financial resource strain:  Not on file  . Food insecurity:    Worry: Not on file    Inability: Not on file  . Transportation needs:    Medical: Not on file    Non-medical: Not on file  Tobacco Use  . Smoking status: Current Every Day Smoker    Packs/day: 0.10    Years: 44.00    Pack years: 4.40    Types: Cigarettes  . Smokeless tobacco: Never Used  Substance and Sexual Activity  . Alcohol use: No    Alcohol/week: 0.0 standard drinks  . Drug use: No  . Sexual activity: Not on file  Lifestyle  . Physical activity:    Days per week: Not on file    Minutes per session: Not on file  . Stress: Not on file  Relationships  . Social connections:    Talks on phone: Not on file    Gets together: Not on file    Attends religious service: Not on file    Active member of club or organization: Not on file    Attends meetings of clubs or organizations: Not on file    Relationship status: Not on file  Other Topics Concern  . Not on file  Social History Narrative   Lives with boyfriend   Caffeine use: Drinks 1 soda a day    Outpatient Encounter Medications as of 07/25/2018  Medication Sig  . ACCU-CHEK  AVIVA PLUS test strip USE TWICE DAILY TO CHECK SUGAR  . albuterol (PROVENTIL) (2.5 MG/3ML) 0.083% nebulizer solution Take 3 mLs (2.5 mg total) by nebulization every 6 (six) hours as needed for wheezing.  Marland Kitchen amitriptyline (ELAVIL) 25 MG tablet Take 25 mg by mouth at bedtime.  Marland Kitchen aspirin EC 81 MG tablet Take 81 mg by mouth daily.  Marland Kitchen atorvastatin (LIPITOR) 20 MG tablet TAKE ONE TABLET BY MOUTH DAILY  . Blood Glucose Monitoring Suppl (ACCU-CHEK AVIVA) device Use as instructed  . buPROPion (WELLBUTRIN SR) 150 MG 12 hr tablet Take 150 mg by mouth 2 (two) times daily.  . cetirizine (ZYRTEC) 10 MG tablet Take 10 mg by mouth daily.  . citalopram (CELEXA) 10 MG tablet Take 20 mg daily by mouth.   . clonazePAM (KLONOPIN) 0.5 MG tablet Take 0.25-0.5 mg by mouth every 8 (eight) hours as needed for anxiety. for anxiety  .  gabapentin (NEURONTIN) 300 MG capsule Take 300 mg by mouth 2 (two) times daily.  Marland Kitchen glucose blood (ACCU-CHEK AVIVA) test strip To test 4 times a day  . hydrochlorothiazide (MICROZIDE) 12.5 MG capsule Take 12.5 mg by mouth daily.  . metFORMIN (GLUCOPHAGE) 850 MG tablet Take 850 mg by mouth 2 (two) times daily with a meal.  . montelukast (SINGULAIR) 10 MG tablet Take 10 mg by mouth at bedtime.  Marland Kitchen omeprazole (PRILOSEC) 20 MG capsule Take 20 mg by mouth daily.  Marland Kitchen PROAIR HFA 108 (90 Base) MCG/ACT inhaler Inhale 2 puffs into the lungs every 4 (four) hours as needed for wheezing or shortness of breath.   Marland Kitchen rOPINIRole (REQUIP) 0.25 MG tablet Take 0.25 mg by mouth every evening.   . Tiotropium Bromide Monohydrate (SPIRIVA RESPIMAT) 2.5 MCG/ACT AERS Inhale 2 puffs into the lungs daily.  . TOUJEO SOLOSTAR 300 UNIT/ML SOPN INJECT 70 UNITS INTO THE SKIN AT BEDTIME.  Marland Kitchen TRUEPLUS PEN NEEDLES 31G X 6 MM MISC USE AS DIRECTED ONCE DAILY.  . valsartan (DIOVAN) 160 MG tablet Take 1 tablet (160 mg total) by mouth daily. (Patient not taking: Reported on 07/25/2018)  . [DISCONTINUED] ferrous sulfate 325 (65 FE) MG tablet Take 325 mg by mouth 2 (two) times daily with a meal.  . [DISCONTINUED] lisinopril (PRINIVIL,ZESTRIL) 20 MG tablet Take 20 mg by mouth daily.   No facility-administered encounter medications on file as of 07/25/2018.    ALLERGIES: Allergies  Allergen Reactions  . Lisinopril Cough   VACCINATION STATUS: Immunization History  Administered Date(s) Administered  . Influenza,inj,Quad PF,6+ Mos 03/26/2018    Diabetes  She presents for her follow-up diabetic visit. She has type 2 diabetes mellitus. Onset time: She was diagnosed at approximate age of 44 years. Her disease course has been stable. There are no hypoglycemic associated symptoms. Pertinent negatives for hypoglycemia include no confusion, headaches, pallor or seizures. Associated symptoms include blurred vision and fatigue. Pertinent negatives  for diabetes include no chest pain and no polyphagia. There are no hypoglycemic complications. Symptoms are stable. Diabetic complications include a CVA. (She is a chronic heavy smoker.) Risk factors for coronary artery disease include diabetes mellitus, dyslipidemia, hypertension, obesity, sedentary lifestyle and tobacco exposure. Current diabetic treatment includes oral agent (monotherapy). Her weight is fluctuating minimally. She is following a generally unhealthy diet. When asked about meal planning, she reported none. She rarely participates in exercise. Her breakfast blood glucose range is generally 140-180 mg/dl. Her overall blood glucose range is 140-180 mg/dl. An ACE inhibitor/angiotensin II receptor blocker is being taken. Eye exam is  current.  Hyperlipidemia  This is a chronic problem. The current episode started more than 1 year ago. The problem is controlled. Recent lipid tests were reviewed and are high. Exacerbating diseases include diabetes and obesity. Associated symptoms include shortness of breath. Pertinent negatives include no chest pain or myalgias. Current antihyperlipidemic treatment includes statins. Risk factors for coronary artery disease include diabetes mellitus, dyslipidemia, hypertension, a sedentary lifestyle, obesity and post-menopausal.  Hypertension  This is a chronic problem. The current episode started more than 1 year ago. The problem is controlled. Associated symptoms include blurred vision and shortness of breath. Pertinent negatives include no chest pain, headaches or palpitations. Risk factors for coronary artery disease include diabetes mellitus, dyslipidemia, sedentary lifestyle, smoking/tobacco exposure, post-menopausal state, family history and obesity. Past treatments include ACE inhibitors. Hypertensive end-organ damage includes CVA.     Review of Systems  Constitutional: Positive for fatigue. Negative for activity change, chills, fever and unexpected weight  change.  HENT: Negative for trouble swallowing and voice change.   Eyes: Positive for blurred vision. Negative for visual disturbance.  Respiratory: Positive for cough and shortness of breath. Negative for wheezing.   Cardiovascular: Negative for chest pain, palpitations and leg swelling.  Gastrointestinal: Negative for diarrhea, nausea and vomiting.  Endocrine: Negative for cold intolerance, heat intolerance and polyphagia.  Genitourinary: Negative for dysuria, flank pain and frequency.  Musculoskeletal: Negative for arthralgias and myalgias.  Skin: Negative for color change, pallor, rash and wound.  Neurological: Negative for seizures and headaches.  Psychiatric/Behavioral: Negative for confusion, dysphoric mood and suicidal ideas.    Objective:    BP 115/61   Pulse 80   Ht 5' (1.524 m)   Wt 209 lb (94.8 kg)   BMI 40.82 kg/m   Wt Readings from Last 3 Encounters:  07/25/18 209 lb (94.8 kg)  06/24/18 210 lb (95.3 kg)  05/09/18 212 lb (96.2 kg)    Physical Exam  Constitutional: She is oriented to person, place, and time. She appears well-developed.  HENT:  Head: Normocephalic and atraumatic.  Eyes: EOM are normal.  Neck: Normal range of motion. Neck supple. No tracheal deviation present. No thyromegaly present.  Cardiovascular: Normal rate.  Pulmonary/Chest: Effort normal. She has wheezes.  Abdominal: Soft. Bowel sounds are normal. There is no abdominal tenderness. There is no guarding.  Musculoskeletal: Normal range of motion.        General: No edema.  Neurological: She is alert and oriented to person, place, and time. She has normal reflexes. No cranial nerve deficit. Coordination normal.  Skin: Skin is warm and dry. No rash noted. No erythema. No pallor.  Psychiatric:  Hesitant affect.     CMP ( most recent) CMP     Component Value Date/Time   NA 135 01/18/2016 1630   NA 140 11/24/2015 1624   K 4.7 01/18/2016 1630   CL 98 (L) 01/18/2016 1630   CO2 27 01/18/2016  1630   GLUCOSE 245 (H) 01/18/2016 1630   BUN 24 (A) 03/21/2018   CREATININE 0.8 03/21/2018   CREATININE 0.63 01/18/2016 1630   CALCIUM 9.6 01/18/2016 1630   PROT 6.6 11/24/2015 1624   ALBUMIN 4.2 11/24/2015 1624   AST 25 11/24/2015 1624   ALT 24 11/24/2015 1624   ALKPHOS 79 11/24/2015 1624   BILITOT 0.4 11/24/2015 1624   GFRNONAA >60 01/18/2016 1630   GFRAA >60 01/18/2016 1630     Diabetic Labs (most recent): Lab Results  Component Value Date   HGBA1C 7.2 07/18/2018  HGBA1C 6.9 03/21/2018   HGBA1C 6.8 11/09/2017   Lipid Panel     Component Value Date/Time   CHOL 115 03/21/2018   TRIG 120 03/21/2018   HDL 31 (A) 03/21/2018   LDLCALC 60 03/21/2018      Assessment & Plan:   1. Uncontrolled type 2 diabetes mellitus with other circulatory complication, without long-term current use of insulin (HCC)   - Patient has currently improving  type 2 DM since  63 years of age. -She returns with stable glycemic profile and A1c of 7.2%, generally improving from 10.2%.    - Her fasting blood glucose profile is considered controlled to near target.  - Recent labs reviewed.   Her diabetes is complicated by TIA, chronic heavy smoking and patient remains at a high risk for more acute and chronic complications of diabetes which include CAD, CVA, CKD, retinopathy, and neuropathy. These are all discussed in detail with the patient.  - I have counseled the patient on diet management and weight loss, by adopting a carbohydrate restricted/protein rich diet.  - Patient admits there is a room for improvement in her diet and drink choices. -  Suggestion is made for her to avoid simple carbohydrates  from her diet including Cakes, Sweet Desserts / Pastries, Ice Cream, Soda (diet and regular), Sweet Tea, Candies, Chips, Cookies, Store Bought Juices, Alcohol in Excess of  1-2 drinks a day, Artificial Sweeteners, and "Sugar-free" Products. This will help patient to have stable blood glucose profile  and potentially avoid unintended weight gain.   - I encouraged the patient to switch to  unprocessed or minimally processed complex starch and increased protein intake (animal or plant source), fruits, and vegetables.  - Patient is advised to stick to a routine mealtimes to eat 3 meals  a day and avoid unnecessary snacks ( to snack only to correct hypoglycemia).   - I have approached patient with the following individualized plan to manage diabetes and patient agrees:    -Based on her presentation with controlled glycemia and A1c of 7.29%, she will not require prandial insulin for now.  -She is advised to continue Toujeo 70 units nightly, associated with monitoring of blood glucose before breakfast and at bedtime.  -Patient is encouraged to call clinic for blood glucose levels less than 70 or above 200 mg /dl.  - I will continue metformin 850 mg by mouth twice a day, therapeutically suitable for patient.  - Patient specific target  A1c;  LDL, HDL, Triglycerides, and  Waist Circumference were discussed in detail.  2) BP/HTN: Her blood pressure is controlled to target.  She is advised to continue her current blood pressure medications including lisinopril 20 mg p.o. daily.   3) Lipids/HPL: Her recent lipid panel showed controlled LDL at 60, improving from 99, triglycerides improving to 120 improving from 213, I advised her to continue  atorvastatin 20 mg by mouth daily at bedtime.   4)  Weight/Diet:  She did not have any further success in weight loss, she still admits significant  dietary indiscretion, CDE Consult  Is in progress  , exercise, and detailed carbohydrates information provided.  5) Chronic Care/Health Maintenance:  -Patient is on ACEI and Statin medications and encouraged to continue to follow up with Ophthalmology, Podiatrist at least yearly or according to recommendations, and advised to  quit smoking. I have recommended yearly flu vaccine and pneumonia vaccination at least  every 5 years; moderate intensity exercise for up to 150 minutes weekly; and  sleep  for at least 7 hours a day.  - I advised patient to maintain close follow up with Burdine, Ananias Pilgrim, MD for primary care needs.  - Time spent with the patient: 25 min, of which >50% was spent in reviewing her blood glucose logs , discussing her hypoglycemia and hyperglycemia episodes, reviewing her current and  previous labs / studies and medications  doses and developing a plan to avoid hypoglycemia and hyperglycemia. Please refer to Patient Instructions for Blood Glucose Monitoring and Insulin/Medications Dosing Guide"  in media tab for additional information. Victoria Horns participated in the discussions, expressed understanding, and voiced agreement with the above plans.  All questions were answered to her satisfaction. she is encouraged to contact clinic should she have any questions or concerns prior to her return visit.   Follow up plan: - Return in about 4 months (around 11/23/2018) for Meter, and Logs.  Marquis Lunch, MD Phone: [phone]  Fax: [phone]   -  This note was partially dictated with voice recognition software. Similar sounding words can be transcribed inadequately or may not  be corrected upon review.  07/25/2018, 11:20 AM

## 2018-07-25 NOTE — Patient Instructions (Signed)

## 2018-07-29 DIAGNOSIS — R262 Difficulty in walking, not elsewhere classified: Secondary | ICD-10-CM | POA: Diagnosis not present

## 2018-07-29 DIAGNOSIS — M545 Low back pain: Secondary | ICD-10-CM | POA: Diagnosis not present

## 2018-07-29 DIAGNOSIS — M48061 Spinal stenosis, lumbar region without neurogenic claudication: Secondary | ICD-10-CM | POA: Diagnosis not present

## 2018-07-30 DIAGNOSIS — M48061 Spinal stenosis, lumbar region without neurogenic claudication: Secondary | ICD-10-CM | POA: Diagnosis not present

## 2018-07-30 DIAGNOSIS — M722 Plantar fascial fibromatosis: Secondary | ICD-10-CM | POA: Diagnosis not present

## 2018-08-02 DIAGNOSIS — R262 Difficulty in walking, not elsewhere classified: Secondary | ICD-10-CM | POA: Diagnosis not present

## 2018-08-02 DIAGNOSIS — M48061 Spinal stenosis, lumbar region without neurogenic claudication: Secondary | ICD-10-CM | POA: Diagnosis not present

## 2018-08-02 DIAGNOSIS — M545 Low back pain: Secondary | ICD-10-CM | POA: Diagnosis not present

## 2018-08-09 DIAGNOSIS — M48061 Spinal stenosis, lumbar region without neurogenic claudication: Secondary | ICD-10-CM | POA: Diagnosis not present

## 2018-08-09 DIAGNOSIS — R262 Difficulty in walking, not elsewhere classified: Secondary | ICD-10-CM | POA: Diagnosis not present

## 2018-08-09 DIAGNOSIS — M545 Low back pain: Secondary | ICD-10-CM | POA: Diagnosis not present

## 2018-08-10 DIAGNOSIS — J449 Chronic obstructive pulmonary disease, unspecified: Secondary | ICD-10-CM | POA: Diagnosis not present

## 2018-09-08 DIAGNOSIS — J449 Chronic obstructive pulmonary disease, unspecified: Secondary | ICD-10-CM | POA: Diagnosis not present

## 2018-09-17 DIAGNOSIS — M25562 Pain in left knee: Secondary | ICD-10-CM | POA: Diagnosis not present

## 2018-10-09 DIAGNOSIS — J449 Chronic obstructive pulmonary disease, unspecified: Secondary | ICD-10-CM | POA: Diagnosis not present

## 2018-10-24 ENCOUNTER — Other Ambulatory Visit: Payer: Self-pay | Admitting: "Endocrinology

## 2018-11-01 DIAGNOSIS — I10 Essential (primary) hypertension: Secondary | ICD-10-CM | POA: Diagnosis not present

## 2018-11-01 DIAGNOSIS — R5382 Chronic fatigue, unspecified: Secondary | ICD-10-CM | POA: Diagnosis not present

## 2018-11-01 DIAGNOSIS — J441 Chronic obstructive pulmonary disease with (acute) exacerbation: Secondary | ICD-10-CM | POA: Diagnosis not present

## 2018-11-01 DIAGNOSIS — E1165 Type 2 diabetes mellitus with hyperglycemia: Secondary | ICD-10-CM | POA: Diagnosis not present

## 2018-11-01 DIAGNOSIS — E7800 Pure hypercholesterolemia, unspecified: Secondary | ICD-10-CM | POA: Diagnosis not present

## 2018-11-01 LAB — BASIC METABOLIC PANEL
BUN: 18 (ref 4–21)
Creatinine: 0.9 (ref 0.5–1.1)

## 2018-11-01 LAB — LIPID PANEL
Cholesterol: 133 (ref 0–200)
HDL: 41 (ref 35–70)
LDL Cholesterol: 66
Triglycerides: 129 (ref 40–160)

## 2018-11-01 LAB — TSH: TSH: 4.33 (ref 0.41–5.90)

## 2018-11-01 LAB — HEMOGLOBIN A1C: Hemoglobin A1C: 7.1

## 2018-11-01 LAB — MICROALBUMIN, URINE: Microalb, Ur: 3.3

## 2018-11-08 DIAGNOSIS — E1165 Type 2 diabetes mellitus with hyperglycemia: Secondary | ICD-10-CM | POA: Diagnosis not present

## 2018-11-08 DIAGNOSIS — Z720 Tobacco use: Secondary | ICD-10-CM | POA: Diagnosis not present

## 2018-11-08 DIAGNOSIS — J449 Chronic obstructive pulmonary disease, unspecified: Secondary | ICD-10-CM | POA: Diagnosis not present

## 2018-11-08 DIAGNOSIS — Z0001 Encounter for general adult medical examination with abnormal findings: Secondary | ICD-10-CM | POA: Diagnosis not present

## 2018-11-08 DIAGNOSIS — I10 Essential (primary) hypertension: Secondary | ICD-10-CM | POA: Diagnosis not present

## 2018-11-26 ENCOUNTER — Other Ambulatory Visit: Payer: Self-pay

## 2018-11-26 ENCOUNTER — Encounter: Payer: Self-pay | Admitting: "Endocrinology

## 2018-11-26 ENCOUNTER — Ambulatory Visit (INDEPENDENT_AMBULATORY_CARE_PROVIDER_SITE_OTHER): Payer: Medicare Other | Admitting: "Endocrinology

## 2018-11-26 ENCOUNTER — Encounter (INDEPENDENT_AMBULATORY_CARE_PROVIDER_SITE_OTHER): Payer: Self-pay

## 2018-11-26 ENCOUNTER — Telehealth: Payer: Self-pay

## 2018-11-26 VITALS — BP 119/71 | HR 82 | Temp 98.0°F | Ht 61.0 in | Wt 215.0 lb

## 2018-11-26 DIAGNOSIS — E669 Obesity, unspecified: Secondary | ICD-10-CM | POA: Diagnosis not present

## 2018-11-26 DIAGNOSIS — I10 Essential (primary) hypertension: Secondary | ICD-10-CM

## 2018-11-26 DIAGNOSIS — E782 Mixed hyperlipidemia: Secondary | ICD-10-CM | POA: Diagnosis not present

## 2018-11-26 DIAGNOSIS — E1169 Type 2 diabetes mellitus with other specified complication: Secondary | ICD-10-CM | POA: Diagnosis not present

## 2018-11-26 NOTE — Patient Instructions (Signed)

## 2018-11-26 NOTE — Progress Notes (Signed)
Endocrinology follow-up note    Subjective:    Patient ID: Victoria Lara, female    DOB: 10-06-1955. Patient is being seen in f/u for management of symptomatic type 2 diabetes, hyperlipidemia, hypertension. PMD:   Juliette Alcide, MD  Past Medical History:  Diagnosis Date  . Anemia   . Anxiety   . Arthritis   . Bronchitis   . Complication of anesthesia    had to come back to ER after shoulder surgery at [facility]; hard to wake up; low O2 sats  . Depression   . Diabetes mellitus without complication (HCC)   . GERD (gastroesophageal reflux disease)   . Hyperlipidemia   . Hypertension   . Neuropathy    Takes Gabapentin  . Pneumonia 2016  . Restless leg syndrome   . Seasonal allergies   . Shortness of breath dyspnea   . Syncope   . TIA (transient ischemic attack) 2006   Past Surgical History:  Procedure Laterality Date  . BREAST LUMPECTOMY Bilateral   . CATARACT EXTRACTION W/PHACO Right 08/20/2014   Procedure: CATARACT EXTRACTION PHACO AND INTRAOCULAR LENS PLACEMENT (IOC);  Surgeon: Gemma Payor, MD;  Location: [facility];  Service: Ophthalmology;  Laterality: Right;  CDE:5.31  . HAND SURGERY Bilateral   . NASAL SEPTOPLASTY W/ TURBINOPLASTY Bilateral 01/26/2016   Procedure: NASAL SEPTOPLASTY WITH BILATERAL TURBINATE REDUCTION;  Surgeon: Newman Pies, MD;  Location: [facility];  Service: ENT;  Laterality: Bilateral;  . NASAL SEPTUM SURGERY  01/26/2016  . NASAL SINUS SURGERY Bilateral 01/26/2016   Procedure: BILATERAL ENDOSCOPIC MAXILLARY ANTROSTOMY;  Surgeon: Newman Pies, MD;  Location: [facility];  Service: ENT;  Laterality: Bilateral;  . SHOULDER ARTHROSCOPY WITH ROTATOR CUFF REPAIR Right   . TUBAL LIGATION     Social History   Socioeconomic History  . Marital status: Widowed    Spouse name: Not on file  . Number of children: 1  . Years of education: 61  . Highest education level: Not on file  Occupational History  . Occupation: Unemployed  Social Needs  . Financial resource strain:  Not on file  . Food insecurity:    Worry: Not on file    Inability: Not on file  . Transportation needs:    Medical: Not on file    Non-medical: Not on file  Tobacco Use  . Smoking status: Current Every Day Smoker    Packs/day: 0.10    Years: 44.00    Pack years: 4.40    Types: Cigarettes  . Smokeless tobacco: Never Used  Substance and Sexual Activity  . Alcohol use: No    Alcohol/week: 0.0 standard drinks  . Drug use: No  . Sexual activity: Not on file  Lifestyle  . Physical activity:    Days per week: Not on file    Minutes per session: Not on file  . Stress: Not on file  Relationships  . Social connections:    Talks on phone: Not on file    Gets together: Not on file    Attends religious service: Not on file    Active member of club or organization: Not on file    Attends meetings of clubs or organizations: Not on file    Relationship status: Not on file  Other Topics Concern  . Not on file  Social History Narrative   Lives with boyfriend   Caffeine use: Drinks 1 soda a day    Outpatient Encounter Medications as of 11/26/2018  Medication Sig  . amLODipine (  NORVASC) 10 MG tablet Take 10 mg by mouth daily.  Marland Kitchen ACCU-CHEK AVIVA PLUS test strip USE TWICE DAILY TO CHECK SUGAR  . albuterol (PROVENTIL) (2.5 MG/3ML) 0.083% nebulizer solution Take 3 mLs (2.5 mg total) by nebulization every 6 (six) hours as needed for wheezing.  Marland Kitchen amitriptyline (ELAVIL) 25 MG tablet Take 25 mg by mouth at bedtime.  Marland Kitchen aspirin EC 81 MG tablet Take 81 mg by mouth daily.  Marland Kitchen atorvastatin (LIPITOR) 20 MG tablet TAKE ONE TABLET BY MOUTH DAILY  . Blood Glucose Monitoring Suppl (ACCU-CHEK AVIVA) device Use as instructed  . buPROPion (WELLBUTRIN SR) 150 MG 12 hr tablet Take 150 mg by mouth 2 (two) times daily.  . cetirizine (ZYRTEC) 10 MG tablet Take 10 mg by mouth daily.  . citalopram (CELEXA) 10 MG tablet Take 20 mg daily by mouth.   . clonazePAM (KLONOPIN) 0.5 MG tablet Take 0.25-0.5 mg by mouth every  8 (eight) hours as needed for anxiety. for anxiety  . gabapentin (NEURONTIN) 300 MG capsule Take 300 mg by mouth 2 (two) times daily.  Marland Kitchen glucose blood (ACCU-CHEK AVIVA) test strip To test 4 times a day  . hydrochlorothiazide (MICROZIDE) 12.5 MG capsule Take 12.5 mg by mouth daily.  . metFORMIN (GLUCOPHAGE) 850 MG tablet Take 850 mg by mouth 2 (two) times daily with a meal.  . montelukast (SINGULAIR) 10 MG tablet Take 10 mg by mouth at bedtime.  Marland Kitchen omeprazole (PRILOSEC) 20 MG capsule Take 20 mg by mouth daily.  Marland Kitchen PROAIR HFA 108 (90 Base) MCG/ACT inhaler Inhale 2 puffs into the lungs every 4 (four) hours as needed for wheezing or shortness of breath.   Marland Kitchen rOPINIRole (REQUIP) 0.25 MG tablet Take 0.25 mg by mouth every evening.   . Tiotropium Bromide Monohydrate (SPIRIVA RESPIMAT) 2.5 MCG/ACT AERS Inhale 2 puffs into the lungs daily.  . TOUJEO SOLOSTAR 300 UNIT/ML SOPN INJECT 70 UNITS INTO THE SKIN AT BEDTIME.  Marland Kitchen TRUEPLUS PEN NEEDLES 31G X 6 MM MISC USE AS DIRECTED ONCE DAILY.  . [DISCONTINUED] valsartan (DIOVAN) 160 MG tablet Take 1 tablet (160 mg total) by mouth daily. (Patient not taking: Reported on 07/25/2018)   No facility-administered encounter medications on file as of 11/26/2018.    ALLERGIES: Allergies  Allergen Reactions  . Lisinopril Cough   VACCINATION STATUS: Immunization History  Administered Date(s) Administered  . Influenza,inj,Quad PF,6+ Mos 03/26/2018    Diabetes  She presents for her follow-up diabetic visit. She has type 2 diabetes mellitus. Onset time: She was diagnosed at approximate age of 44 years. Her disease course has been stable. There are no hypoglycemic associated symptoms. Pertinent negatives for hypoglycemia include no confusion, headaches, pallor or seizures. Pertinent negatives for diabetes include no blurred vision, no chest pain, no fatigue and no polyphagia. There are no hypoglycemic complications. Symptoms are stable. Diabetic complications include a CVA.  (She is a chronic heavy smoker.) Risk factors for coronary artery disease include diabetes mellitus, dyslipidemia, hypertension, obesity, sedentary lifestyle and tobacco exposure. Current diabetic treatment includes oral agent (monotherapy). Her weight is fluctuating minimally. She is following a generally unhealthy diet. When asked about meal planning, she reported none. She rarely participates in exercise. (She did not bring any logs nor meter to review.  She reports that she did not have readings below 70 nor greater than 200 mg per DL.  Her previsit A1c was 7.1%.) An ACE inhibitor/angiotensin II receptor blocker is being taken. Eye exam is current.  Hyperlipidemia  This is a  chronic problem. The current episode started more than 1 year ago. The problem is controlled. Recent lipid tests were reviewed and are high. Exacerbating diseases include diabetes and obesity. Associated symptoms include shortness of breath. Pertinent negatives include no chest pain or myalgias. Current antihyperlipidemic treatment includes statins. Risk factors for coronary artery disease include diabetes mellitus, dyslipidemia, hypertension, a sedentary lifestyle, obesity and post-menopausal.  Hypertension  This is a chronic problem. The current episode started more than 1 year ago. The problem is controlled. Associated symptoms include shortness of breath. Pertinent negatives include no blurred vision, chest pain, headaches or palpitations. Risk factors for coronary artery disease include diabetes mellitus, dyslipidemia, sedentary lifestyle, smoking/tobacco exposure, post-menopausal state, family history and obesity. Past treatments include ACE inhibitors. Hypertensive end-organ damage includes CVA.     Review of Systems  Constitutional: Negative for activity change, chills, fatigue, fever and unexpected weight change.  HENT: Negative for trouble swallowing and voice change.   Eyes: Negative for blurred vision and visual  disturbance.  Respiratory: Positive for cough and shortness of breath. Negative for wheezing.   Cardiovascular: Negative for chest pain, palpitations and leg swelling.  Gastrointestinal: Negative for diarrhea, nausea and vomiting.  Endocrine: Negative for cold intolerance, heat intolerance and polyphagia.  Genitourinary: Negative for dysuria, flank pain and frequency.  Musculoskeletal: Negative for arthralgias and myalgias.  Skin: Negative for color change, pallor, rash and wound.  Neurological: Negative for seizures and headaches.  Psychiatric/Behavioral: Negative for confusion, dysphoric mood and suicidal ideas.    Objective:    BP 119/71   Pulse 82   Temp 98 F (36.7 C)   Ht  (1.549 m)   Wt 215 lb (97.5 kg)   SpO2 94%   BMI 40.62 kg/m   Wt Readings from Last 3 Encounters:  11/26/18 215 lb (97.5 kg)  07/25/18 209 lb (94.8 kg)  06/24/18 210 lb (95.3 kg)    Physical Exam  Constitutional: She is oriented to person, place, and time. She appears well-developed.  HENT:  Head: Normocephalic and atraumatic.  Eyes: EOM are normal.  Neck: Normal range of motion. Neck supple. No tracheal deviation present. No thyromegaly present.  Cardiovascular: Normal rate.  Pulmonary/Chest: Effort normal. She has no wheezes.  Abdominal: There is no abdominal tenderness. There is no guarding.  Musculoskeletal: Normal range of motion.        General: No edema.  Neurological: She is alert and oriented to person, place, and time. She has normal reflexes. No cranial nerve deficit. Coordination normal.  Skin: Skin is warm and dry. No rash noted. No erythema. No pallor.  Psychiatric:  Hesitant affect.     CMP ( most recent) CMP     Component Value Date/Time   NA 135 01/18/2016 1630   NA 140 11/24/2015 1624   K 4.7 01/18/2016 1630   CL 98 (L) 01/18/2016 1630   CO2 27 01/18/2016 1630   GLUCOSE 245 (H) 01/18/2016 1630   BUN 18 11/01/2018   CREATININE 0.9 11/01/2018   CREATININE 0.63  01/18/2016 1630   CALCIUM 9.6 01/18/2016 1630   PROT 6.6 11/24/2015 1624   ALBUMIN 4.2 11/24/2015 1624   AST 25 11/24/2015 1624   ALT 24 11/24/2015 1624   ALKPHOS 79 11/24/2015 1624   BILITOT 0.4 11/24/2015 1624   GFRNONAA >60 01/18/2016 1630   GFRAA >60 01/18/2016 1630     Diabetic Labs (most recent): Lab Results  Component Value Date   HGBA1C 7.1 11/01/2018   HGBA1C 7.2 07/18/2018  HGBA1C 6.9 03/21/2018   Lipid Panel     Component Value Date/Time   CHOL 133 11/01/2018   TRIG 129 11/01/2018   HDL 41 11/01/2018   LDLCALC 66 11/01/2018      Assessment & Plan:   1. Uncontrolled type 2 diabetes mellitus with other circulatory complication, without long-term current use of insulin (HCC)   - Patient has currently improving  type 2 DM since  63 years of age. -She returns with stable A1c of 7.1%,  generally improving from 10.2%.    - Her fasting blood glucose profile is considered controlled to near target.  - Recent labs reviewed.   Her diabetes is complicated by TIA, chronic heavy smoking and patient remains at a high risk for more acute and chronic complications of diabetes which include CAD, CVA, CKD, retinopathy, and neuropathy. These are all discussed in detail with the patient.  - I have counseled the patient on diet management and weight loss, by adopting a carbohydrate restricted/protein rich diet.  - Patient admits there is a room for improvement in her diet and drink choices. -  Suggestion is made for her to avoid simple carbohydrates  from her diet including Cakes, Sweet Desserts / Pastries, Ice Cream, Soda (diet and regular), Sweet Tea, Candies, Chips, Cookies, Store Bought Juices, Alcohol in Excess of  1-2 drinks a day, Artificial Sweeteners, and "Sugar-free" Products. This will help patient to have stable blood glucose profile and potentially avoid unintended weight gain.  - I encouraged the patient to switch to  unprocessed or minimally processed complex  starch and increased protein intake (animal or plant source), fruits, and vegetables.  - Patient is advised to stick to a routine mealtimes to eat 3 meals  a day and avoid unnecessary snacks ( to snack only to correct hypoglycemia).   - I have approached patient with the following individualized plan to manage diabetes and patient agrees:    -Based on her presentation with controlled glycemia and A1c of 7.1%, she will not require prandial insulin for now.  -She is advised to continue Toujeo 70 units nightly, associated with monitoring of blood glucose before breakfast and at bedtime.  -Patient is encouraged to call clinic for blood glucose levels less than 70 or above 200 mg /dl.  - I will continue metformin 850 mg by mouth twice a day, therapeutically suitable for patient.  - Patient specific target  A1c;  LDL, HDL, Triglycerides, and  Waist Circumference were discussed in detail.  2) BP/HTN: Her blood pressure is controlled to target.  She is advised to continue her current blood pressure medications including lisinopril 20 mg p.o. daily.   3) Lipids/HPL: Her recent lipid panel showed controlled LDL at 60, improving from 99, triglycerides improving to 120 improving from 213, I advised her to continue  atorvastatin 20 mg by mouth daily at bedtime.   4)  Weight/Diet:  She did not have any further success in weight loss, she still admits significant  dietary indiscretion, CDE Consult  Is in progress  , exercise, and detailed carbohydrates information provided.  5) Chronic Care/Health Maintenance:  -Patient is on ACEI and Statin medications and encouraged to continue to follow up with Ophthalmology, Podiatrist at least yearly or according to recommendations, and advised to  quit smoking. I have recommended yearly flu vaccine and pneumonia vaccination at least every 5 years; moderate intensity exercise for up to 150 minutes weekly; and  sleep for at least 7 hours a day.  - I  advised patient to  maintain close follow up with Burdine, Ananias Pilgrim, MD for primary care needs.  - Time spent with the patient: 25 min, of which >50% was spent in reviewing her blood glucose logs , discussing her hypoglycemia and hyperglycemia episodes, reviewing her current and  previous labs / studies and medications  doses and developing a plan to avoid hypoglycemia and hyperglycemia. Please refer to Patient Instructions for Blood Glucose Monitoring and Insulin/Medications Dosing Guide"  in media tab for additional information. Please  also refer to " Patient Self Inventory" in the Media  tab for reviewed elements of pertinent patient history.  Lurlean Horns participated in the discussions, expressed understanding, and voiced agreement with the above plans.  All questions were answered to her satisfaction. she is encouraged to contact clinic should she have any questions or concerns prior to her return visit.    Follow up plan: - Return in about 4 months (around 03/28/2019) for Follow up with Pre-visit Labs, Meter, and Logs.  Marquis Lunch, MD Phone: [phone]  Fax: [phone]   -  This note was partially dictated with voice recognition software. Similar sounding words can be transcribed inadequately or may not  be corrected upon review.  11/26/2018, 1:08 PM

## 2018-11-26 NOTE — Telephone Encounter (Signed)
Opened in error

## 2018-12-09 DIAGNOSIS — J449 Chronic obstructive pulmonary disease, unspecified: Secondary | ICD-10-CM | POA: Diagnosis not present

## 2018-12-24 DIAGNOSIS — I10 Essential (primary) hypertension: Secondary | ICD-10-CM | POA: Diagnosis not present

## 2018-12-24 DIAGNOSIS — E1165 Type 2 diabetes mellitus with hyperglycemia: Secondary | ICD-10-CM | POA: Diagnosis not present

## 2019-01-08 DIAGNOSIS — J449 Chronic obstructive pulmonary disease, unspecified: Secondary | ICD-10-CM | POA: Diagnosis not present

## 2019-01-15 DIAGNOSIS — R600 Localized edema: Secondary | ICD-10-CM | POA: Diagnosis not present

## 2019-01-15 DIAGNOSIS — Z720 Tobacco use: Secondary | ICD-10-CM | POA: Diagnosis not present

## 2019-01-15 DIAGNOSIS — E1165 Type 2 diabetes mellitus with hyperglycemia: Secondary | ICD-10-CM | POA: Diagnosis not present

## 2019-01-15 DIAGNOSIS — J449 Chronic obstructive pulmonary disease, unspecified: Secondary | ICD-10-CM | POA: Diagnosis not present

## 2019-02-03 DIAGNOSIS — E7800 Pure hypercholesterolemia, unspecified: Secondary | ICD-10-CM | POA: Diagnosis not present

## 2019-02-03 DIAGNOSIS — E1165 Type 2 diabetes mellitus with hyperglycemia: Secondary | ICD-10-CM | POA: Diagnosis not present

## 2019-02-03 DIAGNOSIS — G2581 Restless legs syndrome: Secondary | ICD-10-CM | POA: Diagnosis not present

## 2019-02-03 LAB — HEMOGLOBIN A1C: Hemoglobin A1C: 7

## 2019-02-06 DIAGNOSIS — J449 Chronic obstructive pulmonary disease, unspecified: Secondary | ICD-10-CM | POA: Diagnosis not present

## 2019-02-06 DIAGNOSIS — Z1389 Encounter for screening for other disorder: Secondary | ICD-10-CM | POA: Diagnosis not present

## 2019-02-06 DIAGNOSIS — I10 Essential (primary) hypertension: Secondary | ICD-10-CM | POA: Diagnosis not present

## 2019-02-06 DIAGNOSIS — R600 Localized edema: Secondary | ICD-10-CM | POA: Diagnosis not present

## 2019-02-06 DIAGNOSIS — E1165 Type 2 diabetes mellitus with hyperglycemia: Secondary | ICD-10-CM | POA: Diagnosis not present

## 2019-02-08 DIAGNOSIS — J449 Chronic obstructive pulmonary disease, unspecified: Secondary | ICD-10-CM | POA: Diagnosis not present

## 2019-03-11 DIAGNOSIS — J449 Chronic obstructive pulmonary disease, unspecified: Secondary | ICD-10-CM | POA: Diagnosis not present

## 2019-03-26 DIAGNOSIS — E1165 Type 2 diabetes mellitus with hyperglycemia: Secondary | ICD-10-CM | POA: Diagnosis not present

## 2019-03-27 DIAGNOSIS — Z23 Encounter for immunization: Secondary | ICD-10-CM | POA: Diagnosis not present

## 2019-03-28 DIAGNOSIS — R5382 Chronic fatigue, unspecified: Secondary | ICD-10-CM | POA: Diagnosis not present

## 2019-03-28 DIAGNOSIS — E1165 Type 2 diabetes mellitus with hyperglycemia: Secondary | ICD-10-CM | POA: Diagnosis not present

## 2019-03-31 ENCOUNTER — Other Ambulatory Visit: Payer: Self-pay

## 2019-03-31 ENCOUNTER — Encounter: Payer: Self-pay | Admitting: "Endocrinology

## 2019-03-31 ENCOUNTER — Ambulatory Visit (INDEPENDENT_AMBULATORY_CARE_PROVIDER_SITE_OTHER): Payer: Medicare Other | Admitting: "Endocrinology

## 2019-03-31 DIAGNOSIS — E782 Mixed hyperlipidemia: Secondary | ICD-10-CM | POA: Diagnosis not present

## 2019-03-31 DIAGNOSIS — E669 Obesity, unspecified: Secondary | ICD-10-CM

## 2019-03-31 DIAGNOSIS — I10 Essential (primary) hypertension: Secondary | ICD-10-CM | POA: Diagnosis not present

## 2019-03-31 DIAGNOSIS — E1169 Type 2 diabetes mellitus with other specified complication: Secondary | ICD-10-CM | POA: Diagnosis not present

## 2019-03-31 MED ORDER — TOUJEO SOLOSTAR 300 UNIT/ML ~~LOC~~ SOPN: 70.0000 [IU] | PEN_INJECTOR | Freq: Every day | SUBCUTANEOUS | 2 refills | Status: DC

## 2019-03-31 NOTE — Progress Notes (Signed)
03/31/2019                                                    Endocrinology Telehealth Visit Follow up Note -During COVID -19 Pandemic  This visit type was conducted due to national recommendations for restrictions regarding the COVID-19 Pandemic  in an effort to limit this patient's exposure and mitigate transmission of the corona virus.  Due to her co-morbid illnesses, Victoria Lara is at  moderate to high risk for complications without adequate follow up.  This format is felt to be most appropriate for her at this time.  I connected with this patient on 03/31/2019   by telephone and verified that I am speaking with the correct person using two identifiers. Victoria Lara, August 14, 1955. she has verbally consented to this visit. All issues noted in this document were discussed and addressed. The format was not optimal for physical exam.    Subjective:    Patient ID: Victoria Lara, female    DOB: 1955-09-23. Patient is being engaged in telehealth via telephone in f/u for management of symptomatic type 2 diabetes, hyperlipidemia, hypertension. PMD:   Victoria Alcide, MD  Past Medical History:  Diagnosis Date  . Anemia   . Anxiety   . Arthritis   . Bronchitis   . Complication of anesthesia    had to come back to ER after shoulder surgery at [facility]; hard to wake up; low O2 sats  . Depression   . Diabetes mellitus without complication (HCC)   . GERD (gastroesophageal reflux disease)   . Hyperlipidemia   . Hypertension   . Neuropathy    Takes Gabapentin  . Pneumonia 2016  . Restless leg syndrome   . Seasonal allergies   . Shortness of breath dyspnea   . Syncope   . TIA (transient ischemic attack) 2006   Past Surgical History:  Procedure Laterality Date  . BREAST LUMPECTOMY Bilateral   . CATARACT EXTRACTION W/PHACO Right 08/20/2014   Procedure: CATARACT EXTRACTION PHACO AND INTRAOCULAR LENS PLACEMENT (IOC);  Surgeon: Gemma Payor, MD;  Location: [facility];  Service:  Ophthalmology;  Laterality: Right;  CDE:5.31  . HAND SURGERY Bilateral   . NASAL SEPTOPLASTY W/ TURBINOPLASTY Bilateral 01/26/2016   Procedure: NASAL SEPTOPLASTY WITH BILATERAL TURBINATE REDUCTION;  Surgeon: Newman Pies, MD;  Location: [facility];  Service: ENT;  Laterality: Bilateral;  . NASAL SEPTUM SURGERY  01/26/2016  . NASAL SINUS SURGERY Bilateral 01/26/2016   Procedure: BILATERAL ENDOSCOPIC MAXILLARY ANTROSTOMY;  Surgeon: Newman Pies, MD;  Location: [facility];  Service: ENT;  Laterality: Bilateral;  . SHOULDER ARTHROSCOPY WITH ROTATOR CUFF REPAIR Right   . TUBAL LIGATION     Social History   Socioeconomic History  . Marital status: Widowed    Spouse name: Not on file  . Number of children: 1  . Years of education: 50  . Highest education level: Not on file  Occupational History  . Occupation: Unemployed  Social Needs  . Financial resource strain: Not on file  . Food insecurity    Worry: Not on file    Inability: Not on file  . Transportation needs    Medical: Not on file    Non-medical: Not on file  Tobacco Use  . Smoking status: Current Every Day Smoker    Packs/day: 0.10  Years: 44.00    Pack years: 4.40    Types: Cigarettes  . Smokeless tobacco: Never Used  Substance and Sexual Activity  . Alcohol use: No    Alcohol/week: 0.0 standard drinks  . Drug use: No  . Sexual activity: Not on file  Lifestyle  . Physical activity    Days per week: Not on file    Minutes per session: Not on file  . Stress: Not on file  Relationships  . Social Herbalist on phone: Not on file    Gets together: Not on file    Attends religious service: Not on file    Active member of club or organization: Not on file    Attends meetings of clubs or organizations: Not on file    Relationship status: Not on file  Other Topics Concern  . Not on file  Social History Narrative   Lives with boyfriend   Caffeine use: Drinks 1 soda a day    Outpatient Encounter Medications as of 03/31/2019   Medication Sig  . ACCU-CHEK AVIVA PLUS test strip USE TWICE DAILY TO CHECK SUGAR  . albuterol (PROVENTIL) (2.5 MG/3ML) 0.083% nebulizer solution Take 3 mLs (2.5 mg total) by nebulization every 6 (six) hours as needed for wheezing.  Marland Kitchen amitriptyline (ELAVIL) 25 MG tablet Take 25 mg by mouth at bedtime.  Marland Kitchen amLODipine (NORVASC) 10 MG tablet Take 10 mg by mouth daily.  Marland Kitchen aspirin EC 81 MG tablet Take 81 mg by mouth daily.  Marland Kitchen atorvastatin (LIPITOR) 20 MG tablet TAKE ONE TABLET BY MOUTH DAILY  . Blood Glucose Monitoring Suppl (ACCU-CHEK AVIVA) device Use as instructed  . buPROPion (WELLBUTRIN SR) 150 MG 12 hr tablet Take 150 mg by mouth 2 (two) times daily.  . cetirizine (ZYRTEC) 10 MG tablet Take 10 mg by mouth daily.  . citalopram (CELEXA) 10 MG tablet Take 20 mg daily by mouth.   . clonazePAM (KLONOPIN) 0.5 MG tablet Take 0.25-0.5 mg by mouth every 8 (eight) hours as needed for anxiety. for anxiety  . gabapentin (NEURONTIN) 300 MG capsule Take 300 mg by mouth 2 (two) times daily.  Marland Kitchen glucose blood (ACCU-CHEK AVIVA) test strip To test 4 times a day  . hydrochlorothiazide (MICROZIDE) 12.5 MG capsule Take 12.5 mg by mouth daily.  . Insulin Glargine, 1 Unit Dial, (TOUJEO SOLOSTAR) 300 UNIT/ML SOPN Inject 70 Units into the skin at bedtime.  . metFORMIN (GLUCOPHAGE) 850 MG tablet Take 850 mg by mouth 2 (two) times daily with a meal.  . montelukast (SINGULAIR) 10 MG tablet Take 10 mg by mouth at bedtime.  Marland Kitchen omeprazole (PRILOSEC) 20 MG capsule Take 20 mg by mouth daily.  Marland Kitchen PROAIR HFA 108 (90 Base) MCG/ACT inhaler Inhale 2 puffs into the lungs every 4 (four) hours as needed for wheezing or shortness of breath.   Marland Kitchen rOPINIRole (REQUIP) 0.25 MG tablet Take 0.25 mg by mouth every evening.   . Tiotropium Bromide Monohydrate (SPIRIVA RESPIMAT) 2.5 MCG/ACT AERS Inhale 2 puffs into the lungs daily.  . TRUEPLUS PEN NEEDLES 31G X 6 MM MISC USE AS DIRECTED ONCE DAILY.  . [DISCONTINUED] TOUJEO SOLOSTAR 300 UNIT/ML  SOPN INJECT 70 UNITS INTO THE SKIN AT BEDTIME.   No facility-administered encounter medications on file as of 03/31/2019.    ALLERGIES: Allergies  Allergen Reactions  . Lisinopril Cough   VACCINATION STATUS: Immunization History  Administered Date(s) Administered  . Influenza,inj,Quad PF,6+ Mos 03/26/2018    Diabetes She presents for  her follow-up diabetic visit. She has type 2 diabetes mellitus. Onset time: She was diagnosed at approximate age of 44 years. Her disease course has been stable. There are no hypoglycemic associated symptoms. Pertinent negatives for hypoglycemia include no confusion, headaches, pallor or seizures. Pertinent negatives for diabetes include no blurred vision, no chest pain, no fatigue and no polyphagia. There are no hypoglycemic complications. Symptoms are stable. Diabetic complications include a CVA. (She is a chronic heavy smoker.) Risk factors for coronary artery disease include diabetes mellitus, dyslipidemia, hypertension, obesity, sedentary lifestyle and tobacco exposure. Current diabetic treatment includes oral agent (monotherapy). She is following a generally unhealthy diet. When asked about meal planning, she reported none. She rarely participates in exercise. Her breakfast blood glucose range is generally 130-140 mg/dl. Her bedtime blood glucose range is generally 140-180 mg/dl. Her overall blood glucose range is 140-180 mg/dl. An ACE inhibitor/angiotensin II receptor blocker is being taken. Eye exam is current.  Hyperlipidemia This is a chronic problem. The current episode started more than 1 year ago. The problem is controlled. Recent lipid tests were reviewed and are high. Exacerbating diseases include diabetes and obesity. Associated symptoms include shortness of breath. Pertinent negatives include no chest pain or myalgias. Current antihyperlipidemic treatment includes statins. Risk factors for coronary artery disease include diabetes mellitus, dyslipidemia,  hypertension, a sedentary lifestyle, obesity and post-menopausal.  Hypertension This is a chronic problem. The current episode started more than 1 year ago. The problem is controlled. Associated symptoms include shortness of breath. Pertinent negatives include no blurred vision, chest pain, headaches or palpitations. Risk factors for coronary artery disease include diabetes mellitus, dyslipidemia, sedentary lifestyle, smoking/tobacco exposure, post-menopausal state, family history and obesity. Past treatments include ACE inhibitors. Hypertensive end-organ damage includes CVA.    Review of systems: Limited as above.  Objective:    There were no vitals taken for this visit.  Wt Readings from Last 3 Encounters:  11/26/18 215 lb (97.5 kg)  07/25/18 209 lb (94.8 kg)  06/24/18 210 lb (95.3 kg)      CMP ( most recent) CMP     Component Value Date/Time   NA 135 01/18/2016 1630   NA 140 11/24/2015 1624   K 4.7 01/18/2016 1630   CL 98 (L) 01/18/2016 1630   CO2 27 01/18/2016 1630   GLUCOSE 245 (H) 01/18/2016 1630   BUN 18 11/01/2018   CREATININE 0.9 11/01/2018   CREATININE 0.63 01/18/2016 1630   CALCIUM 9.6 01/18/2016 1630   PROT 6.6 11/24/2015 1624   ALBUMIN 4.2 11/24/2015 1624   AST 25 11/24/2015 1624   ALT 24 11/24/2015 1624   ALKPHOS 79 11/24/2015 1624   BILITOT 0.4 11/24/2015 1624   GFRNONAA >60 01/18/2016 1630   GFRAA >60 01/18/2016 1630     Diabetic Labs (most recent): Lab Results  Component Value Date   HGBA1C 7.0 02/03/2019   HGBA1C 7.1 11/01/2018   HGBA1C 7.2 07/18/2018   Lipid Panel     Component Value Date/Time   CHOL 133 11/01/2018   TRIG 129 11/01/2018   HDL 41 11/01/2018   LDLCALC 66 11/01/2018      Assessment & Plan:   1. Uncontrolled type 2 diabetes mellitus with other circulatory complication, without long-term current use of insulin (HCC)   - Patient has currently improving  type 2 DM since  63 years of age. -She reports near target glycemic  profile both fasting and postprandial, and her A1c of 7% is improved from 10.2%    -  Recent labs reviewed.   Her diabetes is complicated by TIA, chronic heavy smoking and patient remains at a high risk for more acute and chronic complications of diabetes which include CAD, CVA, CKD, retinopathy, and neuropathy. These are all discussed in detail with the patient.  - I have counseled the patient on diet management and weight loss, by adopting a carbohydrate restricted/protein rich diet.  - she  admits there is a room for improvement in her diet and drink choices. -  Suggestion is made for her to avoid simple carbohydrates  from her diet including Cakes, Sweet Desserts / Pastries, Ice Cream, Soda (diet and regular), Sweet Tea, Candies, Chips, Cookies, Sweet Pastries,  Store Bought Juices, Alcohol in Excess of  1-2 drinks a day, Artificial Sweeteners, Coffee Creamer, and "Sugar-free" Products. This will help patient to have stable blood glucose profile and potentially avoid unintended weight gain.   - I encouraged the patient to switch to  unprocessed or minimally processed complex starch and increased protein intake (animal or plant source), fruits, and vegetables.  - Patient is advised to stick to a routine mealtimes to eat 3 meals  a day and avoid unnecessary snacks ( to snack only to correct hypoglycemia).   - I have approached patient with the following individualized plan to manage diabetes and patient agrees:    -Based on her presentation with controlled glycemia and A1c of 7%, she will not require prandial insulin for now.   -She is advised to continue Toujeo 70 units nightly , associated with monitoring of blood glucose before breakfast and at bedtime.  -Patient is encouraged to call clinic for blood glucose levels less than 70 or above 200 mg /dl.  -She is advised to continue metformin 850 mg by mouth twice a day, therapeutically suitable for patient.  - Patient specific target  A1c;   LDL, HDL, Triglycerides, and  Waist Circumference were discussed in detail.  2) BP/HTN: she is advised to home monitor blood pressure and report if > 140/90 on 2 separate readings.   She is advised to continue her current blood pressure medications including lisinopril 20 mg p.o. daily.   3) Lipids/HPL: Her recent lipid panel showed controlled LDL at 60, improving from 99, triglycerides improving to 120 improving from 213, I advised her to continue atorvastatin 20 mg p.o. nightly at bedtime.     4)  Weight/Diet:  She did not have any further success in weight loss, she still admits significant  dietary indiscretion, CDE Consult  Is in progress  , exercise, and detailed carbohydrates information provided.  5) Chronic Care/Health Maintenance:  -Patient is on ACEI and Statin medications and encouraged to continue to follow up with Ophthalmology, Podiatrist at least yearly or according to recommendations, and advised to  quit smoking. I have recommended yearly flu vaccine and pneumonia vaccination at least every 5 years; moderate intensity exercise for up to 150 minutes weekly; and  sleep for at least 7 hours a day.  - I advised patient to maintain close follow up with Burdine, Ananias Pilgrim, MD for primary care needs.  - Patient Care Time Today:  25 min, of which >50% was spent in  counseling and the rest reviewing her  current and  previous labs/studies, previous treatments, her blood glucose readings, and medications' doses and developing a plan for long-term care based on the latest recommendations for standards of care.   Victoria Lara participated in the discussions, expressed understanding, and voiced agreement with the above  plans.  All questions were answered to her satisfaction. she is encouraged to contact clinic should she have any questions or concerns prior to her return visit.   Follow up plan: - Return in about 6 months (around 09/29/2019) for Bring Meter and Logs- A1c in Office,  Include 8 log sheets.  Marquis LunchGebre Ryver Poblete, MD Phone: [phone]  Fax: [phone]   -  This note was partially dictated with voice recognition software. Similar sounding words can be transcribed inadequately or may not  be corrected upon review.  03/31/2019, 11:52 AM

## 2019-04-10 DIAGNOSIS — J449 Chronic obstructive pulmonary disease, unspecified: Secondary | ICD-10-CM | POA: Diagnosis not present

## 2019-05-05 ENCOUNTER — Other Ambulatory Visit: Payer: Self-pay | Admitting: "Endocrinology

## 2019-05-07 DIAGNOSIS — E1165 Type 2 diabetes mellitus with hyperglycemia: Secondary | ICD-10-CM | POA: Diagnosis not present

## 2019-05-07 DIAGNOSIS — Z720 Tobacco use: Secondary | ICD-10-CM | POA: Diagnosis not present

## 2019-05-07 DIAGNOSIS — I10 Essential (primary) hypertension: Secondary | ICD-10-CM | POA: Diagnosis not present

## 2019-05-07 DIAGNOSIS — G2581 Restless legs syndrome: Secondary | ICD-10-CM | POA: Diagnosis not present

## 2019-05-07 DIAGNOSIS — E7800 Pure hypercholesterolemia, unspecified: Secondary | ICD-10-CM | POA: Diagnosis not present

## 2019-05-09 DIAGNOSIS — Z720 Tobacco use: Secondary | ICD-10-CM | POA: Diagnosis not present

## 2019-05-09 DIAGNOSIS — J449 Chronic obstructive pulmonary disease, unspecified: Secondary | ICD-10-CM | POA: Diagnosis not present

## 2019-05-09 DIAGNOSIS — R600 Localized edema: Secondary | ICD-10-CM | POA: Diagnosis not present

## 2019-05-09 DIAGNOSIS — E1165 Type 2 diabetes mellitus with hyperglycemia: Secondary | ICD-10-CM | POA: Diagnosis not present

## 2019-05-09 DIAGNOSIS — I10 Essential (primary) hypertension: Secondary | ICD-10-CM | POA: Diagnosis not present

## 2019-05-09 DIAGNOSIS — M7542 Impingement syndrome of left shoulder: Secondary | ICD-10-CM | POA: Diagnosis not present

## 2019-05-11 DIAGNOSIS — J449 Chronic obstructive pulmonary disease, unspecified: Secondary | ICD-10-CM | POA: Diagnosis not present

## 2019-05-26 DIAGNOSIS — E782 Mixed hyperlipidemia: Secondary | ICD-10-CM | POA: Diagnosis not present

## 2019-05-26 DIAGNOSIS — I10 Essential (primary) hypertension: Secondary | ICD-10-CM | POA: Diagnosis not present

## 2019-06-03 DIAGNOSIS — H109 Unspecified conjunctivitis: Secondary | ICD-10-CM | POA: Diagnosis not present

## 2019-06-10 DIAGNOSIS — J449 Chronic obstructive pulmonary disease, unspecified: Secondary | ICD-10-CM | POA: Diagnosis not present

## 2019-06-19 ENCOUNTER — Other Ambulatory Visit: Payer: Self-pay | Admitting: "Endocrinology

## 2019-07-11 DIAGNOSIS — J449 Chronic obstructive pulmonary disease, unspecified: Secondary | ICD-10-CM | POA: Diagnosis not present

## 2019-07-25 ENCOUNTER — Other Ambulatory Visit: Payer: Self-pay | Admitting: "Endocrinology

## 2019-07-25 DIAGNOSIS — E1165 Type 2 diabetes mellitus with hyperglycemia: Secondary | ICD-10-CM | POA: Diagnosis not present

## 2019-07-25 DIAGNOSIS — I10 Essential (primary) hypertension: Secondary | ICD-10-CM | POA: Diagnosis not present

## 2019-07-25 DIAGNOSIS — E7849 Other hyperlipidemia: Secondary | ICD-10-CM | POA: Diagnosis not present

## 2019-08-05 DIAGNOSIS — I10 Essential (primary) hypertension: Secondary | ICD-10-CM | POA: Diagnosis not present

## 2019-08-05 DIAGNOSIS — E1165 Type 2 diabetes mellitus with hyperglycemia: Secondary | ICD-10-CM | POA: Diagnosis not present

## 2019-08-11 DIAGNOSIS — J449 Chronic obstructive pulmonary disease, unspecified: Secondary | ICD-10-CM | POA: Diagnosis not present

## 2019-08-21 DIAGNOSIS — Z720 Tobacco use: Secondary | ICD-10-CM | POA: Diagnosis not present

## 2019-08-21 DIAGNOSIS — J449 Chronic obstructive pulmonary disease, unspecified: Secondary | ICD-10-CM | POA: Diagnosis not present

## 2019-08-21 DIAGNOSIS — E1165 Type 2 diabetes mellitus with hyperglycemia: Secondary | ICD-10-CM | POA: Diagnosis not present

## 2019-09-24 DIAGNOSIS — I10 Essential (primary) hypertension: Secondary | ICD-10-CM | POA: Diagnosis not present

## 2019-09-25 DIAGNOSIS — J449 Chronic obstructive pulmonary disease, unspecified: Secondary | ICD-10-CM | POA: Diagnosis not present

## 2019-09-27 DIAGNOSIS — J441 Chronic obstructive pulmonary disease with (acute) exacerbation: Secondary | ICD-10-CM | POA: Diagnosis not present

## 2019-09-27 DIAGNOSIS — J301 Allergic rhinitis due to pollen: Secondary | ICD-10-CM | POA: Diagnosis not present

## 2019-09-30 ENCOUNTER — Ambulatory Visit: Payer: Medicare Other | Admitting: "Endocrinology

## 2019-10-08 DIAGNOSIS — H25011 Cortical age-related cataract, right eye: Secondary | ICD-10-CM | POA: Diagnosis not present

## 2019-10-20 ENCOUNTER — Other Ambulatory Visit: Payer: Self-pay | Admitting: "Endocrinology

## 2019-10-24 DIAGNOSIS — I10 Essential (primary) hypertension: Secondary | ICD-10-CM | POA: Diagnosis not present

## 2019-10-24 DIAGNOSIS — J441 Chronic obstructive pulmonary disease with (acute) exacerbation: Secondary | ICD-10-CM | POA: Diagnosis not present

## 2019-10-31 ENCOUNTER — Ambulatory Visit (INDEPENDENT_AMBULATORY_CARE_PROVIDER_SITE_OTHER): Payer: Medicare Other | Admitting: "Endocrinology

## 2019-10-31 ENCOUNTER — Other Ambulatory Visit: Payer: Self-pay

## 2019-10-31 ENCOUNTER — Encounter: Payer: Self-pay | Admitting: "Endocrinology

## 2019-10-31 VITALS — BP 145/74 | HR 76 | Ht 61.0 in | Wt 214.0 lb

## 2019-10-31 DIAGNOSIS — E669 Obesity, unspecified: Secondary | ICD-10-CM | POA: Diagnosis not present

## 2019-10-31 DIAGNOSIS — E782 Mixed hyperlipidemia: Secondary | ICD-10-CM | POA: Diagnosis not present

## 2019-10-31 DIAGNOSIS — E1169 Type 2 diabetes mellitus with other specified complication: Secondary | ICD-10-CM | POA: Diagnosis not present

## 2019-10-31 DIAGNOSIS — F17200 Nicotine dependence, unspecified, uncomplicated: Secondary | ICD-10-CM

## 2019-10-31 DIAGNOSIS — I10 Essential (primary) hypertension: Secondary | ICD-10-CM

## 2019-10-31 LAB — POCT GLYCOSYLATED HEMOGLOBIN (HGB A1C): Hemoglobin A1C: 7.8 % — AB (ref 4.0–5.6)

## 2019-10-31 MED ORDER — TOUJEO SOLOSTAR 300 UNIT/ML ~~LOC~~ SOPN: 80.0000 [IU] | PEN_INJECTOR | Freq: Every day | SUBCUTANEOUS | 2 refills | Status: DC

## 2019-10-31 NOTE — Progress Notes (Signed)
10/31/2019   Endocrinology follow-up note   Subjective:    Patient ID: Victoria Lara, female    DOB: 27-Dec-1955.  She is here in follow-up for the management of  symptomatic type 2 diabetes, hyperlipidemia, hypertension.  PMD:   Juliette Alcide, MD  Past Medical History:  Diagnosis Date  . Anemia   . Anxiety   . Arthritis   . Bronchitis   . Complication of anesthesia    had to come back to ER after shoulder surgery at [facility]; hard to wake up; low O2 sats  . Depression   . Diabetes mellitus without complication (HCC)   . GERD (gastroesophageal reflux disease)   . Hyperlipidemia   . Hypertension   . Neuropathy    Takes Gabapentin  . Pneumonia 2016  . Restless leg syndrome   . Seasonal allergies   . Shortness of breath dyspnea   . Syncope   . TIA (transient ischemic attack) 2006   Past Surgical History:  Procedure Laterality Date  . BREAST LUMPECTOMY Bilateral   . CATARACT EXTRACTION W/PHACO Right 08/20/2014   Procedure: CATARACT EXTRACTION PHACO AND INTRAOCULAR LENS PLACEMENT (IOC);  Surgeon: Gemma Payor, MD;  Location: [facility];  Service: Ophthalmology;  Laterality: Right;  CDE:5.31  . HAND SURGERY Bilateral   . NASAL SEPTOPLASTY W/ TURBINOPLASTY Bilateral 01/26/2016   Procedure: NASAL SEPTOPLASTY WITH BILATERAL TURBINATE REDUCTION;  Surgeon: Newman Pies, MD;  Location: [facility];  Service: ENT;  Laterality: Bilateral;  . NASAL SEPTUM SURGERY  01/26/2016  . NASAL SINUS SURGERY Bilateral 01/26/2016   Procedure: BILATERAL ENDOSCOPIC MAXILLARY ANTROSTOMY;  Surgeon: Newman Pies, MD;  Location: [facility];  Service: ENT;  Laterality: Bilateral;  . SHOULDER ARTHROSCOPY WITH ROTATOR CUFF REPAIR Right   . TUBAL LIGATION     Social History   Socioeconomic History  . Marital status: Widowed    Spouse name: Not on file  . Number of children: 1  . Years of education: 20  . Highest education level: Not on file  Occupational History  . Occupation: Unemployed  Tobacco Use  . Smoking status:  Current Every Day Smoker    Packs/day: 0.10    Years: 44.00    Pack years: 4.40    Types: Cigarettes  . Smokeless tobacco: Never Used  Substance and Sexual Activity  . Alcohol use: No    Alcohol/week: 0.0 standard drinks  . Drug use: No  . Sexual activity: Not on file  Other Topics Concern  . Not on file  Social History Narrative   Lives with boyfriend   Caffeine use: Drinks 1 soda a day    Social Determinants of Health   Financial Resource Strain:   . Difficulty of Paying Living Expenses:   Food Insecurity:   . Worried About Programme researcher, broadcasting/film/video in the Last Year:   . Barista in the Last Year:   Transportation Needs:   . Freight forwarder (Medical):   Marland Kitchen Lack of Transportation (Non-Medical):   Physical Activity:   . Days of Exercise per Week:   . Minutes of Exercise per Session:   Stress:   . Feeling of Stress :   Social Connections:   . Frequency of Communication with Friends and Family:   . Frequency of Social Gatherings with Friends and Family:   . Attends Religious Services:   . Active Member of Clubs or Organizations:   . Attends Banker Meetings:   Marland Kitchen Marital Status:  Outpatient Encounter Medications as of 10/31/2019  Medication Sig  . ACCU-CHEK AVIVA PLUS test strip USE TWICE DAILY TO CHECK SUGAR  . albuterol (PROVENTIL) (2.5 MG/3ML) 0.083% nebulizer solution Take 3 mLs (2.5 mg total) by nebulization every 6 (six) hours as needed for wheezing.  Marland Kitchen amitriptyline (ELAVIL) 25 MG tablet Take 25 mg by mouth at bedtime.  Marland Kitchen amLODipine (NORVASC) 10 MG tablet Take 10 mg by mouth daily.  Marland Kitchen aspirin EC 81 MG tablet Take 81 mg by mouth daily.  Marland Kitchen atorvastatin (LIPITOR) 20 MG tablet TAKE ONE TABLET BY MOUTH DAILY  . Blood Glucose Monitoring Suppl (ACCU-CHEK AVIVA) device Use as instructed  . buPROPion (WELLBUTRIN SR) 150 MG 12 hr tablet Take 150 mg by mouth 2 (two) times daily.  . cetirizine (ZYRTEC) 10 MG tablet Take 10 mg by mouth daily.  .  citalopram (CELEXA) 10 MG tablet Take 20 mg daily by mouth.   . clonazePAM (KLONOPIN) 0.5 MG tablet Take 0.25-0.5 mg by mouth every 8 (eight) hours as needed for anxiety. for anxiety  . gabapentin (NEURONTIN) 300 MG capsule Take 300 mg by mouth 2 (two) times daily.  Marland Kitchen glucose blood (ACCU-CHEK AVIVA) test strip To test 4 times a day  . hydrochlorothiazide (MICROZIDE) 12.5 MG capsule Take 12.5 mg by mouth daily.  . insulin glargine, 1 Unit Dial, (TOUJEO SOLOSTAR) 300 UNIT/ML Solostar Pen Inject 80 Units into the skin at bedtime.  . metFORMIN (GLUCOPHAGE) 850 MG tablet Take 850 mg by mouth 2 (two) times daily with a meal.  . montelukast (SINGULAIR) 10 MG tablet Take 10 mg by mouth at bedtime.  Marland Kitchen omeprazole (PRILOSEC) 20 MG capsule Take 20 mg by mouth daily.  Marland Kitchen PROAIR HFA 108 (90 Base) MCG/ACT inhaler Inhale 2 puffs into the lungs every 4 (four) hours as needed for wheezing or shortness of breath.   Marland Kitchen rOPINIRole (REQUIP) 0.25 MG tablet Take 0.25 mg by mouth every evening.   . Tiotropium Bromide Monohydrate (SPIRIVA RESPIMAT) 2.5 MCG/ACT AERS Inhale 2 puffs into the lungs daily.  Marland Kitchen ULTICARE MINI PEN NEEDLES 31G X 6 MM MISC USE AS DIRECTED ONCE DAILY.  . [DISCONTINUED] TOUJEO SOLOSTAR 300 UNIT/ML Solostar Pen INJECT 70 UNITS INTO THE SKIN AT BEDTIME.   No facility-administered encounter medications on file as of 10/31/2019.   ALLERGIES: Allergies  Allergen Reactions  . Lisinopril Cough   VACCINATION STATUS: Immunization History  Administered Date(s) Administered  . Influenza,inj,Quad PF,6+ Mos 03/26/2018    Diabetes She presents for her follow-up diabetic visit. She has type 2 diabetes mellitus. Onset time: She was diagnosed at approximate age of 64 years. Her disease course has been worsening. There are no hypoglycemic associated symptoms. Pertinent negatives for hypoglycemia include no confusion, headaches, pallor or seizures. Associated symptoms include polydipsia and polyuria. Pertinent  negatives for diabetes include no blurred vision, no chest pain, no fatigue and no polyphagia. There are no hypoglycemic complications. Symptoms are worsening. Diabetic complications include a CVA. (She is a chronic heavy smoker.) Risk factors for coronary artery disease include diabetes mellitus, dyslipidemia, hypertension, obesity, sedentary lifestyle and tobacco exposure. Current diabetic treatment includes oral agent (monotherapy). Her weight is fluctuating minimally. She is following a generally unhealthy diet. When asked about meal planning, she reported none. She rarely participates in exercise. Her home blood glucose trend is fluctuating minimally. Her breakfast blood glucose range is generally 140-180 mg/dl. Her bedtime blood glucose range is generally 140-180 mg/dl. Her overall blood glucose range is 140-180 mg/dl. (She presents  with above target glycemic profile at fasting, point-of-care A1c today 7.8% increasing from 7%.) An ACE inhibitor/angiotensin II receptor blocker is being taken. Eye exam is current.  Hyperlipidemia This is a chronic problem. The current episode started more than 1 year ago. The problem is controlled. Recent lipid tests were reviewed and are high. Exacerbating diseases include diabetes and obesity. Associated symptoms include shortness of breath. Pertinent negatives include no chest pain or myalgias. Current antihyperlipidemic treatment includes statins. Risk factors for coronary artery disease include diabetes mellitus, dyslipidemia, hypertension, a sedentary lifestyle, obesity and post-menopausal.  Hypertension This is a chronic problem. The current episode started more than 1 year ago. The problem is controlled. Associated symptoms include shortness of breath. Pertinent negatives include no blurred vision, chest pain, headaches or palpitations. Risk factors for coronary artery disease include diabetes mellitus, dyslipidemia, sedentary lifestyle, smoking/tobacco exposure,  post-menopausal state, family history and obesity. Past treatments include ACE inhibitors. Hypertensive end-organ damage includes CVA.    Review of systems  Constitutional: + Minimally fluctuating body weight,  current  Body mass index is 40.43 kg/m. , no fatigue, no subjective hyperthermia, no subjective hypothermia Eyes: no blurry vision, no xerophthalmia ENT: no sore throat, no nodules palpated in throat, no dysphagia/odynophagia, no hoarseness Cardiovascular: no Chest Pain, no Shortness of Breath, no palpitations, no leg swelling Respiratory: + cough, + shortness of breath on exertion Gastrointestinal: no Nausea/Vomiting/Diarhhea Musculoskeletal: no muscle/joint aches Skin: no rashes, no hyperemia Neurological: no tremors, no numbness, no tingling, no dizziness Psychiatric: no depression, no anxiety   Objective:    BP (!) 145/74   Pulse 76   Ht 5\' 1"  (1.549 m)   Wt 214 lb (97.1 kg)   BMI 40.43 kg/m   Wt Readings from Last 3 Encounters:  10/31/19 214 lb (97.1 kg)  11/26/18 215 lb (97.5 kg)  07/25/18 209 lb (94.8 kg)     Physical Exam- Limited  Constitutional:  Body mass index is 40.43 kg/m. , not in acute distress, normal state of mind Eyes:  EOMI, no exophthalmos Neck: Supple Thyroid: No gross goiter Respiratory: Adequate breathing efforts Musculoskeletal: no gross deformities, strength intact in all four extremities, no gross restriction of joint movements Skin:  no rashes, no hyperemia Neurological: no tremor with outstretched hands,    CMP     Component Value Date/Time   NA 135 01/18/2016 1630   NA 140 11/24/2015 1624   K 4.7 01/18/2016 1630   CL 98 (L) 01/18/2016 1630   CO2 27 01/18/2016 1630   GLUCOSE 245 (H) 01/18/2016 1630   BUN 18 11/01/2018 0000   CREATININE 0.9 11/01/2018 0000   CREATININE 0.63 01/18/2016 1630   CALCIUM 9.6 01/18/2016 1630   PROT 6.6 11/24/2015 1624   ALBUMIN 4.2 11/24/2015 1624   AST 25 11/24/2015 1624   ALT 24 11/24/2015  1624   ALKPHOS 79 11/24/2015 1624   BILITOT 0.4 11/24/2015 1624   GFRNONAA >60 01/18/2016 1630   GFRAA >60 01/18/2016 1630     Diabetic Labs (most recent): Lab Results  Component Value Date   HGBA1C 7.8 (A) 10/31/2019   HGBA1C 7.0 02/03/2019   HGBA1C 7.1 11/01/2018   Lipid Panel     Component Value Date/Time   CHOL 133 11/01/2018 0000   TRIG 129 11/01/2018 0000   HDL 41 11/01/2018 0000   LDLCALC 66 11/01/2018 0000      Assessment & Plan:   1. Uncontrolled type 2 diabetes mellitus with other circulatory complication, without long-term current use  of insulin ([place])   - Patient has currently improving  type 2 DM since  64 years of age.  She presents with above target glycemic profile at fasting, point-of-care A1c today 7.8% increasing from 7%.   - Recent labs reviewed.   Her diabetes is complicated by TIA, chronic heavy smoking and patient remains at a high risk for more acute and chronic complications of diabetes which include CAD, CVA, CKD, retinopathy, and neuropathy. These are all discussed in detail with the patient.  - I have counseled the patient on diet management and weight loss, by adopting a carbohydrate restricted/protein rich diet.  - she  admits there is a room for improvement in her diet and drink choices. -  Suggestion is made for her to avoid simple carbohydrates  from her diet including Cakes, Sweet Desserts / Pastries, Ice Cream, Soda (diet and regular), Sweet Tea, Candies, Chips, Cookies, Sweet Pastries,  Store Bought Juices, Alcohol in Excess of  1-2 drinks a day, Artificial Sweeteners, Coffee Creamer, and "Sugar-free" Products. This will help patient to have stable blood glucose profile and potentially avoid unintended weight gain.   - I encouraged the patient to switch to  unprocessed or minimally processed complex starch and increased protein intake (animal or plant source), fruits, and vegetables.  - Patient is advised to stick to a routine  mealtimes to eat 3 meals  a day and avoid unnecessary snacks ( to snack only to correct hypoglycemia).   - I have approached patient with the following individualized plan to manage diabetes and patient agrees:    -Based on her presentation with above target fasting glycemic profile, she will be considered for higher dose of Toujeo.    -I discussed and increase her Toujeo to 80 units nightly, associated with monitoring of blood glucose before breakfast and at bedtime.  -Patient is encouraged to call clinic for blood glucose levels less than 70 or above 200 mg /dl.  -She is advised to continue metformin 850 mg by mouth twice a day, therapeutically suitable for patient.  - Patient specific target  A1c;  LDL, HDL, Triglycerides,  were discussed in detail.  2) BP/HTN:  Her blood pressure is not controlled to target.   She is advised to continue her current blood pressure medications including lisinopril 20 mg p.o. daily.   3) Lipids/HPL: Her recent lipid panel showed controlled LDL at 60, improving from 99, triglycerides improving to 120 improving from 213, I advised her to continue atorvastatin 20 mg p.o. nightly.  Side effects and precautions discussed with her.    4)  Weight/Diet: Her BMI is 40.4-clearly complicating her diabetes care.  She is a candidate for modest weight loss.  She did not have any further success in weight loss, she still admits significant  dietary indiscretion, CDE Consult  Is in progress  , exercise, and detailed carbohydrates information provided.  5) Chronic Care/Health Maintenance:  -Patient is on ACEI and Statin medications and encouraged to continue to follow up with Ophthalmology, Podiatrist at least yearly or according to recommendations, and advised to  quit smoking. I have recommended yearly flu vaccine and pneumonia vaccination at least every 5 years; moderate intensity exercise for up to 150 minutes weekly; and  sleep for at least 7 hours a day.  The patient  was counseled on the dangers of tobacco use, and was advised to quit.  Reviewed strategies to maximize success, including removing cigarettes and smoking materials from environment.   - I advised patient  to maintain close follow up with Burdine, Ananias Pilgrim, MD for primary care needs.  - Time spent on this patient care encounter:  35 min, of which > 50% was spent in  counseling and the rest reviewing her blood glucose logs , discussing her hypoglycemia and hyperglycemia episodes, reviewing her current and  previous labs / studies  ( including abstraction from other facilities) and medications  doses and developing a  long term treatment plan and documenting her care.   Please refer to Patient Instructions for Blood Glucose Monitoring and Insulin/Medications Dosing Guide"  in media tab for additional information. Please  also refer to " Patient Self Inventory" in the Media  tab for reviewed elements of pertinent patient history.  Lurlean Horns participated in the discussions, expressed understanding, and voiced agreement with the above plans.  All questions were answered to her satisfaction. she is encouraged to contact clinic should she have any questions or concerns prior to her return visit.    Follow up plan: - Return in about 3 months (around 01/31/2020) for Bring Meter and Logs- A1c in Office, Follow up with Pre-visit Labs.  Marquis Lunch, MD Phone: [phone]  Fax: [phone]   -  This note was partially dictated with voice recognition software. Similar sounding words can be transcribed inadequately or may not  be corrected upon review.  10/31/2019, 11:21 AM

## 2019-10-31 NOTE — Patient Instructions (Signed)

## 2019-11-08 DIAGNOSIS — J449 Chronic obstructive pulmonary disease, unspecified: Secondary | ICD-10-CM | POA: Diagnosis not present

## 2019-11-10 DIAGNOSIS — H02834 Dermatochalasis of left upper eyelid: Secondary | ICD-10-CM | POA: Diagnosis not present

## 2019-11-10 DIAGNOSIS — H25812 Combined forms of age-related cataract, left eye: Secondary | ICD-10-CM | POA: Diagnosis not present

## 2019-11-10 DIAGNOSIS — H02831 Dermatochalasis of right upper eyelid: Secondary | ICD-10-CM | POA: Diagnosis not present

## 2019-11-10 DIAGNOSIS — Z961 Presence of intraocular lens: Secondary | ICD-10-CM | POA: Diagnosis not present

## 2019-11-10 DIAGNOSIS — E119 Type 2 diabetes mellitus without complications: Secondary | ICD-10-CM | POA: Diagnosis not present

## 2019-11-24 DIAGNOSIS — E1165 Type 2 diabetes mellitus with hyperglycemia: Secondary | ICD-10-CM | POA: Diagnosis not present

## 2019-11-24 DIAGNOSIS — J441 Chronic obstructive pulmonary disease with (acute) exacerbation: Secondary | ICD-10-CM | POA: Diagnosis not present

## 2019-11-24 DIAGNOSIS — Z794 Long term (current) use of insulin: Secondary | ICD-10-CM | POA: Diagnosis not present

## 2019-11-24 DIAGNOSIS — I10 Essential (primary) hypertension: Secondary | ICD-10-CM | POA: Diagnosis not present

## 2019-11-24 DIAGNOSIS — Z720 Tobacco use: Secondary | ICD-10-CM | POA: Diagnosis not present

## 2019-11-27 ENCOUNTER — Encounter: Payer: Self-pay | Admitting: "Endocrinology

## 2019-11-27 DIAGNOSIS — E1165 Type 2 diabetes mellitus with hyperglycemia: Secondary | ICD-10-CM | POA: Diagnosis not present

## 2019-11-27 DIAGNOSIS — E1169 Type 2 diabetes mellitus with other specified complication: Secondary | ICD-10-CM | POA: Diagnosis not present

## 2019-11-27 DIAGNOSIS — I10 Essential (primary) hypertension: Secondary | ICD-10-CM | POA: Diagnosis not present

## 2019-11-27 DIAGNOSIS — J441 Chronic obstructive pulmonary disease with (acute) exacerbation: Secondary | ICD-10-CM | POA: Diagnosis not present

## 2019-11-27 DIAGNOSIS — R5382 Chronic fatigue, unspecified: Secondary | ICD-10-CM | POA: Diagnosis not present

## 2019-11-27 LAB — LIPID PANEL
Cholesterol: 138 (ref 0–200)
HDL: 36 (ref 35–70)
LDL Cholesterol: 75
Triglycerides: 156 (ref 40–160)

## 2019-11-27 LAB — MICROALBUMIN, URINE: Microalb, Ur: 22.8

## 2019-11-27 LAB — TSH: TSH: 3.17 (ref 0.41–5.90)

## 2019-12-04 DIAGNOSIS — G2581 Restless legs syndrome: Secondary | ICD-10-CM | POA: Diagnosis not present

## 2019-12-04 DIAGNOSIS — E1165 Type 2 diabetes mellitus with hyperglycemia: Secondary | ICD-10-CM | POA: Diagnosis not present

## 2019-12-04 DIAGNOSIS — J449 Chronic obstructive pulmonary disease, unspecified: Secondary | ICD-10-CM | POA: Diagnosis not present

## 2019-12-04 DIAGNOSIS — I10 Essential (primary) hypertension: Secondary | ICD-10-CM | POA: Diagnosis not present

## 2019-12-09 DIAGNOSIS — J449 Chronic obstructive pulmonary disease, unspecified: Secondary | ICD-10-CM | POA: Diagnosis not present

## 2019-12-10 DIAGNOSIS — Z122 Encounter for screening for malignant neoplasm of respiratory organs: Secondary | ICD-10-CM | POA: Diagnosis not present

## 2019-12-10 DIAGNOSIS — F17210 Nicotine dependence, cigarettes, uncomplicated: Secondary | ICD-10-CM | POA: Diagnosis not present

## 2019-12-24 DIAGNOSIS — I10 Essential (primary) hypertension: Secondary | ICD-10-CM | POA: Diagnosis not present

## 2019-12-24 DIAGNOSIS — Z794 Long term (current) use of insulin: Secondary | ICD-10-CM | POA: Diagnosis not present

## 2019-12-24 DIAGNOSIS — Z720 Tobacco use: Secondary | ICD-10-CM | POA: Diagnosis not present

## 2019-12-24 DIAGNOSIS — J441 Chronic obstructive pulmonary disease with (acute) exacerbation: Secondary | ICD-10-CM | POA: Diagnosis not present

## 2019-12-24 DIAGNOSIS — E1165 Type 2 diabetes mellitus with hyperglycemia: Secondary | ICD-10-CM | POA: Diagnosis not present

## 2020-01-08 DIAGNOSIS — J449 Chronic obstructive pulmonary disease, unspecified: Secondary | ICD-10-CM | POA: Diagnosis not present

## 2020-01-12 DIAGNOSIS — H25812 Combined forms of age-related cataract, left eye: Secondary | ICD-10-CM | POA: Diagnosis not present

## 2020-01-13 NOTE — H&P (Signed)
Surgical History & Physical  Patient Name: Victoria Lara DOB: [birth year]  Surgery: Cataract extraction with intraocular lens implant phacoemulsification; Left Eye  Surgeon: Fabio Pierce MD Surgery Date:  01/19/2020 Pre-Op Date:  01/12/2020  HPI: A 89 Yr. old female patient presents for a cataract evaluation. PT has had cataract surgery in her right eye. PT is wearing glasses currently full time. PT states she is unable to see well out of her left eye. She reports having mild headaches that she feels is due to her blurred vision. She feels her glasses are no longer helping. PT states she is noticing more halos and glare especially while driving at night. This is negatively affecting the patient's quality of life. Pt is DM II- BS this AM was 163. Last A1C was 7. No eye pain. No flashes and floaters. HPI was performed by Fabio Pierce .  Medical History: Cataract Diabetic Retinopathy (mild NPDR) OU Anxiety Depression Diabetes - DM Type 2 High Blood Pressure Lung Problems  Review of Systems Cardiovascular High Blood Pressure All recorded systems are negative except as noted above.  Social   Current every day smoker   Medication Albuterol, Spiriva, Fluticasone, Amlodipine Besylate, Bupropion HCL, Citalopram, Clonazepam, Furosemide, Gabapentin, Hydrochlorothiazide, Montelukast, Omeprazole, Ropinrole hcl, Valsartan, Toujeo,   Sx/Procedures Right shoulder,   Drug Allergies   NKDA  History & Physical: Heent:  Cataract, Left eye NECK: supple without bruits LUNGS: lungs clear to auscultation CV: regular rate and rhythm Abdomen: soft and non-tender  Impression & Plan: Assessment: 1.  COMBINED FORMS AGE RELATED CATARACT; Left Eye (H25.812) 2.  Diabetes Type 2 No retinopathy (E11.9) 3.  DERMATOCHALASIS, no surgery; Right Upper Lid, Left Upper Lid (H02.831, H02.834) 4.  INTRAOCULAR LENS IOL ; Right Eye (Z96.1)  Plan: 1.  Cataract accounts for the patient's decreased vision.  This visual impairment is not correctable with a tolerable change in glasses or contact lenses. Cataract surgery with an implantation of a new lens should significantly improve the visual and functional status of the patient. Discussed all risks, benefits, alternatives, and potential complications. Discussed the procedures and recovery. Patient desires to have surgery. A-scan ordered and performed today for intra-ocular lens calculations. The surgery will be performed in order to improve vision for driving, reading, and for eye examinations. Recommend phacoemulsification with intra-ocular lens. Left Eye. Surgery required to correct imbalance of vision. Dilates well - shugarcaine by protocol. 2.  Stressed importance of blood sugar and blood pressure control, and also yearly eye examinations. Discussed the need for ongoing proactive ocular exams and treatment, hopefully before visual symptoms develop. 3.  May be visually significant. will evaluate after surgery. 4.  Stable. Doing well since surgery

## 2020-01-15 NOTE — Patient Instructions (Signed)
Victoria Lara  01/15/2020     @PREFPERIOPPHARMACY @   Your procedure is scheduled on  01/19/2020 .  Report to [facility] at  1030  A.M.  Call this number if you have problems the morning of surgery:  [phone]   Remember:  Do not eat or drink after midnight.                      Take these medicines the morning of surgery with A SIP OF WATER  Wellbutrin, celexa, clonazepam, gabapentin, prilosec. Take 1/2 of your night time insulin the night before your procedure. DO NOT take ant medications for diabetes the morning of your procedure.    Do not wear jewelry, make-up or nail polish.  Do not wear lotions, powders, or perfumes. Please wear deodorant and brush your teeth.  Do not shave 48 hours prior to surgery.  Men may shave face and neck.  Do not bring valuables to the hospital.  [facility] is not responsible for any belongings or valuables.  Contacts, dentures or bridgework may not be worn into surgery.  Leave your suitcase in the car.  After surgery it may be brought to your room.  For patients admitted to the hospital, discharge time will be determined by your treatment team.  Patients discharged the day of surgery will not be allowed to drive home.   Name and phone number of your driver:   family Special instructions:  DO NOT smoke the morning of your procedure.  Please read over the following fact sheets that you were given. Surgical Site Infection Prevention, Anesthesia Post-op Instructions and Care and Recovery After Surgery       Cataract Surgery, Care After This sheet gives you information about how to care for yourself after your procedure. Your health care provider may also give you more specific instructions. If you have problems or questions, contact your health care provider. What can I expect after the procedure? After the procedure, it is common to have:  Itching.  Discomfort.  Fluid discharge.  Sensitivity to light and to  touch.  Bruising in or around the eye.  Mild blurred vision. Follow these instructions at home: Eye care   Do not touch or rub your eyes.  Protect your eyes as told by your health care provider. You may be told to wear a protective eye shield or sunglasses.  Do not put a contact lens into the affected eye or eyes until your health care provider approves.  Keep the area around your eye clean and dry: ? Avoid swimming. ? Do not allow water to hit you directly in the face while showering. ? Keep soap and shampoo out of your eyes.  Check your eye every day for signs of infection. Watch for: ? Redness, swelling, or pain. ? Fluid, blood, or pus. ? Warmth. ? A bad smell. ? Vision that is getting worse. ? Sensitivity that is getting worse. Activity  Do not drive for 24 hours if you were given a sedative during your procedure.  Avoid strenuous activities, such as playing contact sports, for as long as told by your health care provider.  Do not drive or use heavy machinery until your health care provider approves.  Do not bend or lift heavy objects. Bending increases pressure in the eye. You can walk, climb stairs, and do light household chores.  Ask your health care provider when you can return to work. If you  work in a dusty environment, you may be advised to wear protective eyewear for a period of time. General instructions  Take or apply over-the-counter and prescription medicines only as told by your health care provider. This includes eye drops.  Keep all follow-up visits as told by your health care provider. This is important. Contact a health care provider if:  You have increased bruising around your eye.  You have pain that is not helped with medicine.  You have a fever.  You have redness, swelling, or pain in your eye.  You have fluid, blood, or pus coming from your incision.  Your vision gets worse.  Your sensitivity to light gets worse. Get help right away  if:  You have sudden loss of vision.  You see flashes of light or spots (floaters).  You have severe eye pain.  You develop nausea or vomiting. Summary  After your procedure, it is common to have itching, discomfort, bruising, fluid discharge, or sensitivity to light.  Follow instructions from your health care provider about caring for your eye after the procedure.  Do not rub your eye after the procedure. You may need to wear eye protection or sunglasses. Do not wear contact lenses. Keep the area around your eye clean and dry.  Avoid activities that require a lot of effort. These include playing sports and lifting heavy objects.  Contact a health care provider if you have increased bruising, pain that does not go away, or a fever. Get help right away if you suddenly lose your vision, see flashes of light or spots, or have severe pain in the eye. This information is not intended to replace advice given to you by your health care provider. Make sure you discuss any questions you have with your health care provider. Document Revised: 04/08/2019 Document Reviewed: 12/10/2017 Elsevier Patient Education  2020 [employer] Monitored Anesthesia Care, Care After These instructions provide you with information about caring for yourself after your procedure. Your health care provider may also give you more specific instructions. Your treatment has been planned according to current medical practices, but problems sometimes occur. Call your health care provider if you have any problems or questions after your procedure. What can I expect after the procedure? After your procedure, you may:  Feel sleepy for several hours.  Feel clumsy and have poor balance for several hours.  Feel forgetful about what happened after the procedure.  Have poor judgment for several hours.  Feel nauseous or vomit.  Have a sore throat if you had a breathing tube during the procedure. Follow these instructions  at home: For at least 24 hours after the procedure:      Have a responsible adult stay with you. It is important to have someone help care for you until you are awake and alert.  Rest as needed.  Do not: ? Participate in activities in which you could fall or become injured. ? Drive. ? Use heavy machinery. ? Drink alcohol. ? Take sleeping pills or medicines that cause drowsiness. ? Make important decisions or sign legal documents. ? Take care of children on your own. Eating and drinking  Follow the diet that is recommended by your health care provider.  If you vomit, drink water, juice, or soup when you can drink without vomiting.  Make sure you have little or no nausea before eating solid foods. General instructions  Take over-the-counter and prescription medicines only as told by your health care provider.  If you have sleep  apnea, surgery and certain medicines can increase your risk for breathing problems. Follow instructions from your health care provider about wearing your sleep device: ? Anytime you are sleeping, including during daytime naps. ? While taking prescription pain medicines, sleeping medicines, or medicines that make you drowsy.  If you smoke, do not smoke without supervision.  Keep all follow-up visits as told by your health care provider. This is important. Contact a health care provider if:  You keep feeling nauseous or you keep vomiting.  You feel light-headed.  You develop a rash.  You have a fever. Get help right away if:  You have trouble breathing. Summary  For several hours after your procedure, you may feel sleepy and have poor judgment.  Have a responsible adult stay with you for at least 24 hours or until you are awake and alert. This information is not intended to replace advice given to you by your health care provider. Make sure you discuss any questions you have with your health care provider. Document Revised: 09/10/2017 Document  Reviewed: 10/03/2015 Elsevier Patient Education  2020 [employer].

## 2020-01-16 ENCOUNTER — Other Ambulatory Visit (HOSPITAL_COMMUNITY)
Admission: RE | Admit: 2020-01-16 | Discharge: 2020-01-16 | Disposition: A | Discharge status: Home / Self Care | Payer: Medicare Other | Source: Ambulatory Visit | Attending: Ophthalmology | Admitting: Ophthalmology

## 2020-01-16 ENCOUNTER — Encounter (HOSPITAL_COMMUNITY): Payer: Self-pay

## 2020-01-16 ENCOUNTER — Encounter (HOSPITAL_COMMUNITY)
Admission: RE | Admit: 2020-01-16 | Discharge: 2020-01-16 | Disposition: A | Discharge status: Home / Self Care | Payer: Medicare Other | Source: Ambulatory Visit | Attending: Ophthalmology | Admitting: Ophthalmology

## 2020-01-16 ENCOUNTER — Other Ambulatory Visit: Payer: Self-pay

## 2020-01-16 DIAGNOSIS — Z01818 Encounter for other preprocedural examination: Secondary | ICD-10-CM | POA: Insufficient documentation

## 2020-01-16 DIAGNOSIS — Z20822 Contact with and (suspected) exposure to covid-19: Secondary | ICD-10-CM | POA: Insufficient documentation

## 2020-01-16 HISTORY — DX: Sleep apnea, unspecified: G47.30

## 2020-01-16 LAB — CBC WITH DIFFERENTIAL/PLATELET
Abs Immature Granulocytes: 0.06 10*3/uL (ref 0.00–0.07)
Basophils Absolute: 0.1 10*3/uL (ref 0.0–0.1)
Basophils Relative: 1 %
Eosinophils Absolute: 0.2 10*3/uL (ref 0.0–0.5)
Eosinophils Relative: 2 %
HCT: 45.7 % (ref 36.0–46.0)
Hemoglobin: 15 g/dL (ref 12.0–15.0)
Immature Granulocytes: 1 %
Lymphocytes Relative: 25 %
Lymphs Abs: 2 10*3/uL (ref 0.7–4.0)
MCH: 32.1 pg (ref 26.0–34.0)
MCHC: 32.8 g/dL (ref 30.0–36.0)
MCV: 97.6 fL (ref 80.0–100.0)
Monocytes Absolute: 0.7 10*3/uL (ref 0.1–1.0)
Monocytes Relative: 9 %
Neutro Abs: 5 10*3/uL (ref 1.7–7.7)
Neutrophils Relative %: 62 %
Platelets: 201 10*3/uL (ref 150–400)
RBC: 4.68 MIL/uL (ref 3.87–5.11)
RDW: 13.3 % (ref 11.5–15.5)
WBC: 7.9 10*3/uL (ref 4.0–10.5)
nRBC: 0 % (ref 0.0–0.2)

## 2020-01-16 LAB — BASIC METABOLIC PANEL
Anion gap: 11 (ref 5–15)
BUN: 19 mg/dL (ref 8–23)
CO2: 27 mmol/L (ref 22–32)
Calcium: 9.3 mg/dL (ref 8.9–10.3)
Chloride: 102 mmol/L (ref 98–111)
Creatinine, Ser: 0.67 mg/dL (ref 0.44–1.00)
GFR calc Af Amer: >60 mL/min (ref >60)
GFR calc non Af Amer: >60 mL/min (ref >60)
Glucose, Bld: 85 mg/dL (ref 70–99)
Potassium: 4.2 mmol/L (ref 3.5–5.1)
Sodium: 140 mmol/L (ref 135–145)

## 2020-01-16 LAB — HEMOGLOBIN A1C
Hgb A1c MFr Bld: 7.2 % — ABNORMAL HIGH (ref 4.8–5.6)
Mean Plasma Glucose: 159.94 mg/dL

## 2020-01-17 LAB — SARS CORONAVIRUS 2 (TAT 6-24 HRS): SARS Coronavirus 2: NEGATIVE

## 2020-01-19 ENCOUNTER — Ambulatory Visit (HOSPITAL_COMMUNITY): Payer: Medicare Other | Admitting: Anesthesiology

## 2020-01-19 ENCOUNTER — Encounter (HOSPITAL_COMMUNITY)
Admission: RE | Disposition: A | Discharge status: Home / Self Care | Payer: Self-pay | Source: Home / Self Care | Attending: Ophthalmology

## 2020-01-19 ENCOUNTER — Ambulatory Visit (HOSPITAL_COMMUNITY)
Admission: RE | Admit: 2020-01-19 | Discharge: 2020-01-19 | Disposition: A | Discharge status: Home / Self Care | Payer: Medicare Other | Attending: Ophthalmology | Admitting: Ophthalmology

## 2020-01-19 ENCOUNTER — Encounter (HOSPITAL_COMMUNITY): Payer: Self-pay | Admitting: Ophthalmology

## 2020-01-19 DIAGNOSIS — K219 Gastro-esophageal reflux disease without esophagitis: Secondary | ICD-10-CM | POA: Insufficient documentation

## 2020-01-19 DIAGNOSIS — E119 Type 2 diabetes mellitus without complications: Secondary | ICD-10-CM | POA: Diagnosis not present

## 2020-01-19 DIAGNOSIS — Z794 Long term (current) use of insulin: Secondary | ICD-10-CM | POA: Insufficient documentation

## 2020-01-19 DIAGNOSIS — E113293 Type 2 diabetes mellitus with mild nonproliferative diabetic retinopathy without macular edema, bilateral: Secondary | ICD-10-CM | POA: Diagnosis not present

## 2020-01-19 DIAGNOSIS — Z8673 Personal history of transient ischemic attack (TIA), and cerebral infarction without residual deficits: Secondary | ICD-10-CM | POA: Diagnosis not present

## 2020-01-19 DIAGNOSIS — F329 Major depressive disorder, single episode, unspecified: Secondary | ICD-10-CM | POA: Diagnosis not present

## 2020-01-19 DIAGNOSIS — I10 Essential (primary) hypertension: Secondary | ICD-10-CM | POA: Diagnosis not present

## 2020-01-19 DIAGNOSIS — F17200 Nicotine dependence, unspecified, uncomplicated: Secondary | ICD-10-CM | POA: Diagnosis not present

## 2020-01-19 DIAGNOSIS — H25812 Combined forms of age-related cataract, left eye: Secondary | ICD-10-CM | POA: Insufficient documentation

## 2020-01-19 DIAGNOSIS — E1136 Type 2 diabetes mellitus with diabetic cataract: Secondary | ICD-10-CM | POA: Diagnosis not present

## 2020-01-19 DIAGNOSIS — F419 Anxiety disorder, unspecified: Secondary | ICD-10-CM | POA: Insufficient documentation

## 2020-01-19 DIAGNOSIS — J449 Chronic obstructive pulmonary disease, unspecified: Secondary | ICD-10-CM | POA: Diagnosis not present

## 2020-01-19 DIAGNOSIS — Z79899 Other long term (current) drug therapy: Secondary | ICD-10-CM | POA: Insufficient documentation

## 2020-01-19 HISTORY — PX: CATARACT EXTRACTION W/PHACO: SHX586

## 2020-01-19 LAB — GLUCOSE, CAPILLARY: Glucose-Capillary: 112 mg/dL — ABNORMAL HIGH (ref 70–99)

## 2020-01-19 SURGERY — PHACOEMULSIFICATION, CATARACT, WITH IOL INSERTION
Anesthesia: Monitor Anesthesia Care | Site: Eye | Laterality: Left

## 2020-01-19 MED ORDER — LACTATED RINGERS IV SOLN: INTRAVENOUS | Status: DC

## 2020-01-19 MED ORDER — TETRACAINE HCL 0.5 % OP SOLN
1.0000 [drp] | OPHTHALMIC | Status: AC | PRN
  Administered 2020-01-19 (×3): 1 [drp] via OPHTHALMIC

## 2020-01-19 MED ORDER — NEOMYCIN-POLYMYXIN-DEXAMETH 3.5-10000-0.1 OP SUSP
OPHTHALMIC | Status: DC | PRN
  Administered 2020-01-19: 1 [drp] via OPHTHALMIC

## 2020-01-19 MED ORDER — EPINEPHRINE PF 1 MG/ML IJ SOLN
INTRAOCULAR | Status: DC | PRN
  Administered 2020-01-19: 500 mL

## 2020-01-19 MED ORDER — LIDOCAINE HCL 3.5 % OP GEL
1.0000 "application " | Freq: Once | OPHTHALMIC | Status: AC
  Administered 2020-01-19: 1 via OPHTHALMIC

## 2020-01-19 MED ORDER — LIDOCAINE HCL (PF) 1 % IJ SOLN
INTRAOCULAR | Status: DC | PRN
  Administered 2020-01-19: 1 mL via OPHTHALMIC

## 2020-01-19 MED ORDER — PROVISC 10 MG/ML IO SOLN
INTRAOCULAR | Status: DC | PRN
  Administered 2020-01-19: 0.85 mL via INTRAOCULAR

## 2020-01-19 MED ORDER — POVIDONE-IODINE 5 % OP SOLN
OPHTHALMIC | Status: DC | PRN
  Administered 2020-01-19: 1 via OPHTHALMIC

## 2020-01-19 MED ORDER — BSS IO SOLN
INTRAOCULAR | Status: DC | PRN
  Administered 2020-01-19: 15 mL via INTRAOCULAR

## 2020-01-19 MED ORDER — CYCLOPENTOLATE-PHENYLEPHRINE 0.2-1 % OP SOLN
1.0000 [drp] | OPHTHALMIC | Status: AC | PRN
  Administered 2020-01-19 (×3): 1 [drp] via OPHTHALMIC

## 2020-01-19 MED ORDER — PHENYLEPHRINE HCL 2.5 % OP SOLN
1.0000 [drp] | OPHTHALMIC | Status: AC | PRN
  Administered 2020-01-19 (×3): 1 [drp] via OPHTHALMIC

## 2020-01-19 MED ORDER — IPRATROPIUM-ALBUTEROL 0.5-2.5 (3) MG/3ML IN SOLN
RESPIRATORY_TRACT | Status: AC
  Filled 2020-01-19: qty 3

## 2020-01-19 MED ORDER — IPRATROPIUM-ALBUTEROL 0.5-2.5 (3) MG/3ML IN SOLN
3.0000 mL | Freq: Once | RESPIRATORY_TRACT | Status: AC
  Administered 2020-01-19: 3 mL via RESPIRATORY_TRACT

## 2020-01-19 MED ORDER — SODIUM HYALURONATE 23 MG/ML IO SOLN
INTRAOCULAR | Status: DC | PRN
  Administered 2020-01-19: 0.6 mL via INTRAOCULAR

## 2020-01-19 SURGICAL SUPPLY — 12 items
CLOTH BEACON ORANGE TIMEOUT ST (SAFETY) ×2 IMPLANT
EYE SHIELD UNIVERSAL CLEAR (GAUZE/BANDAGES/DRESSINGS) ×2 IMPLANT
GLOVE BIOGEL PI IND STRL 7.0 (GLOVE) ×2 IMPLANT
GLOVE BIOGEL PI INDICATOR 7.0 (GLOVE) ×2
LENS ALC ACRYL/TECN (Ophthalmic Related) ×2 IMPLANT
NEEDLE HYPO 18GX1.5 BLUNT FILL (NEEDLE) ×2 IMPLANT
PAD ARMBOARD 7.5X6 YLW CONV (MISCELLANEOUS) ×2 IMPLANT
SYR TB 1ML LL NO SAFETY (SYRINGE) ×2 IMPLANT
TAPE SURG TRANSPORE 1 IN (GAUZE/BANDAGES/DRESSINGS) ×1 IMPLANT
TAPE SURGICAL TRANSPORE 1 IN (GAUZE/BANDAGES/DRESSINGS) ×2
VISCOELASTIC ADDITIONAL (OPHTHALMIC RELATED) ×2 IMPLANT
WATER STERILE IRR 250ML POUR (IV SOLUTION) ×2 IMPLANT

## 2020-01-19 NOTE — Discharge Instructions (Signed)
Please discharge patient when stable, will follow up today with Dr. Berenize Gatlin at the [facility] office immediately following discharge.  Leave shield in place until visit.  All paperwork with discharge instructions will be given at the office.   [facility] Address:  [address]  [place], [place] [address]  

## 2020-01-19 NOTE — Transfer of Care (Signed)
Immediate Anesthesia Transfer of Care Note  Patient: Victoria Lara  Procedure(s) Performed: CATARACT EXTRACTION PHACO AND INTRAOCULAR LENS PLACEMENT (IOC) (Left Eye)  Patient Location: PACU  Anesthesia Type:MAC  Level of Consciousness: awake, alert , oriented and patient cooperative  Airway & Oxygen Therapy: Patient Spontanous Breathing  Post-op Assessment: Report given to RN, Post -op Vital signs reviewed and stable and Patient moving all extremities X 4  Post vital signs: Reviewed and stable  Last Vitals:  Vitals Value Taken Time  BP    Temp    Pulse    Resp    SpO2      Last Pain:  Vitals:   01/19/20 1218  TempSrc: Oral  PainSc: 0-No pain      Patients Stated Pain Goal: 8 (41/03/01 3143)  Complications: No complications documented.

## 2020-01-19 NOTE — Anesthesia Postprocedure Evaluation (Signed)
Anesthesia Post Note  Patient: Victoria Lara  Procedure(s) Performed: CATARACT EXTRACTION PHACO AND INTRAOCULAR LENS PLACEMENT (IOC) (Left Eye)  Patient location during evaluation: Phase II Anesthesia Type: MAC Level of consciousness: awake, oriented, awake and alert and patient cooperative Pain management: satisfactory to patient Vital Signs Assessment: post-procedure vital signs reviewed and stable Respiratory status: spontaneous breathing, respiratory function stable and nonlabored ventilation Cardiovascular status: stable Postop Assessment: no apparent nausea or vomiting Anesthetic complications: no   No complications documented.   Last Vitals:  Vitals:   01/19/20 1218  BP: (!) 131/70  Resp: 19  Temp: 36.9 C  SpO2: 93%    Last Pain:  Vitals:   01/19/20 1218  TempSrc: Oral  PainSc: 0-No pain                 Coye Dawood

## 2020-01-19 NOTE — Interval H&P Note (Signed)
History and Physical Interval Note:  01/19/2020 1:11 PM  [name]  has presented today for surgery, with the diagnosis of Nuclear sclerotic cataract - Left eye.  The various methods of treatment have been discussed with the patient and family. After consideration of risks, benefits and other options for treatment, the patient has consented to  Procedure(s) with comments: CATARACT EXTRACTION PHACO AND INTRAOCULAR LENS PLACEMENT (IOC) (Left) - left as a surgical intervention.  The patient's history has been reviewed, patient examined, no change in status, stable for surgery.  I have reviewed the patient's chart and labs.  Questions were answered to the patient's satisfaction.     Victoria Lara

## 2020-01-19 NOTE — Anesthesia Preprocedure Evaluation (Addendum)
Anesthesia Evaluation  Patient identified by MRN, date of birth, ID band Patient awake    Reviewed: Allergy & Precautions, NPO status , Patient's Chart, lab work & pertinent test results  History of Anesthesia Complications (+) history of anesthetic complications (SOB after sx, low O2 sats)  Airway Mallampati: II  TM Distance: >3 FB Neck ROM: Full    Dental  (+) Partial Upper, Partial Lower, Missing, Dental Advisory Given   Pulmonary shortness of breath, with exertion and lying, sleep apnea , pneumonia, resolved, COPD,  COPD inhaler, Current SmokerPatient did not abstain from smoking.,    breath sounds clear to auscultation + decreased breath sounds      Cardiovascular Exercise Tolerance: Poor hypertension, Pt. on medications Normal cardiovascular exam Rhythm:Regular Rate:Normal     Neuro/Psych PSYCHIATRIC DISORDERS Anxiety Depression TIA   GI/Hepatic GERD  Medicated and Controlled,  Endo/Other  diabetes, Well Controlled, Type 2Morbid obesity  Renal/GU      Musculoskeletal  (+) Arthritis ,   Abdominal   Peds  Hematology  (+) anemia ,   Anesthesia Other Findings   Reproductive/Obstetrics                           Anesthesia Physical Anesthesia Plan  ASA: III  Anesthesia Plan: MAC   Post-op Pain Management:    Induction:   PONV Risk Score and Plan:   Airway Management Planned: Nasal Cannula and Natural Airway  Additional Equipment:   Intra-op Plan:   Post-operative Plan:   Informed Consent: I have reviewed the patients History and Physical, chart, labs and discussed the procedure including the risks, benefits and alternatives for the proposed anesthesia with the patient or authorized representative who has indicated his/her understanding and acceptance.     Dental advisory given  Plan Discussed with: CRNA and Surgeon  Anesthesia Plan Comments: (Will give duoneb nebulizer  treatment before the procedure.)       Anesthesia Quick Evaluation

## 2020-01-19 NOTE — Op Note (Signed)
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 Alizza Sacra, MD, MA  Anesthesia: MAC, Topical Akten  Complications: None  Estimated Blood Loss: <41m (minimal)  Specimens: None  Implants: As above  Indications:  Visually significant age-related cataract, Left Eye  Procedure:  The patient was seen and identified in the pre-operative area. The operative eye was identified and dilated.  The operative eye was marked.  Topical anesthesia was administered to the operative eye.     The patient was then to the operative suite and placed in the supine position.  A timeout was performed confirming the patient, procedure to be performed, and all other relevant information.   The patient's face was prepped and draped in the usual fashion for intra-ocular surgery.  A lid speculum was placed into the operative eye and the surgical microscope moved into place and focused.  An inferotemporal paracentesis was created using a 20 gauge paracentesis blade.  Shugarcaine was injected into the anterior chamber.  Viscoelastic was injected into the anterior chamber.  A temporal clear-corneal main wound incision was created using a 2.427mmicrokeratome.  A continuous curvilinear capsulorrhexis was initiated using an irrigating cystitome and completed using capsulorrhexis forceps.  Hydrodissection and hydrodeliniation were performed.  Viscoelastic was injected into the anterior chamber.  A phacoemulsification handpiece and a chopper as a second instrument were used to remove the nucleus and epinucleus. The irrigation/aspiration handpiece was used to remove  any remaining cortical material.   The capsular bag was reinflated with viscoelastic, checked, and found to be intact.  The intraocular lens was inserted into the capsular bag.  The irrigation/aspiration handpiece was used to remove any remaining viscoelastic.  The clear corneal wound and paracentesis wounds were then hydrated and checked with Weck-Cels to be watertight.  The lid-speculum was removed.  The drape was removed.  The patient's face was cleaned with a wet and dry 4x4.   Maxitrol was instilled in the eye. A clear shield was taped over the eye. The patient was taken to the post-operative care unit in good condition, having tolerated the procedure well.  Post-Op Instructions: [facility] and will receive all other orders and instructions.

## 2020-01-20 ENCOUNTER — Encounter (HOSPITAL_COMMUNITY): Payer: Self-pay | Admitting: Ophthalmology

## 2020-01-23 DIAGNOSIS — Z794 Long term (current) use of insulin: Secondary | ICD-10-CM | POA: Diagnosis not present

## 2020-01-23 DIAGNOSIS — I10 Essential (primary) hypertension: Secondary | ICD-10-CM | POA: Diagnosis not present

## 2020-01-23 DIAGNOSIS — Z720 Tobacco use: Secondary | ICD-10-CM | POA: Diagnosis not present

## 2020-01-23 DIAGNOSIS — J441 Chronic obstructive pulmonary disease with (acute) exacerbation: Secondary | ICD-10-CM | POA: Diagnosis not present

## 2020-01-23 DIAGNOSIS — E1165 Type 2 diabetes mellitus with hyperglycemia: Secondary | ICD-10-CM | POA: Diagnosis not present

## 2020-02-02 ENCOUNTER — Other Ambulatory Visit: Payer: Self-pay | Admitting: "Endocrinology

## 2020-02-04 ENCOUNTER — Ambulatory Visit: Payer: Medicare Other | Admitting: "Endocrinology

## 2020-02-08 DIAGNOSIS — J449 Chronic obstructive pulmonary disease, unspecified: Secondary | ICD-10-CM | POA: Diagnosis not present

## 2020-02-11 DIAGNOSIS — E1169 Type 2 diabetes mellitus with other specified complication: Secondary | ICD-10-CM | POA: Diagnosis not present

## 2020-02-12 LAB — MICROALBUMIN / CREATININE URINE RATIO
Creatinine, Urine: 73 mg/dL (ref 20–275)
Microalb Creat Ratio: 15 mcg/mg creat (ref <30)
Microalb, Ur: 1.1 mg/dL

## 2020-02-12 LAB — LIPID PANEL
Cholesterol: 126 mg/dL (ref <200)
HDL: 38 mg/dL — ABNORMAL LOW (ref >=50)
LDL Cholesterol (Calc): 68 mg/dL (calc)
Non-HDL Cholesterol (Calc): 88 mg/dL (calc) (ref <130)
Total CHOL/HDL Ratio: 3.3 (calc) (ref <5.0)
Triglycerides: 122 mg/dL (ref <150)

## 2020-02-12 LAB — COMPLETE METABOLIC PANEL WITH GFR
AG Ratio: 1.6 (calc) (ref 1.0–2.5)
ALT: 17 U/L (ref 6–29)
AST: 18 U/L (ref 10–35)
Albumin: 3.9 g/dL (ref 3.6–5.1)
Alkaline phosphatase (APISO): 58 U/L (ref 37–153)
BUN: 21 mg/dL (ref 7–25)
CO2: 36 mmol/L — ABNORMAL HIGH (ref 20–32)
Calcium: 9.3 mg/dL (ref 8.6–10.4)
Chloride: 101 mmol/L (ref 98–110)
Creat: 0.89 mg/dL (ref 0.50–0.99)
GFR, Est African American: 79 mL/min/{1.73_m2} (ref >=60)
GFR, Est Non African American: 68 mL/min/{1.73_m2} (ref >=60)
Globulin: 2.5 g/dL (calc) (ref 1.9–3.7)
Glucose, Bld: 160 mg/dL — ABNORMAL HIGH (ref 65–99)
Potassium: 4.6 mmol/L (ref 3.5–5.3)
Sodium: 141 mmol/L (ref 135–146)
Total Bilirubin: 0.3 mg/dL (ref 0.2–1.2)
Total Protein: 6.4 g/dL (ref 6.1–8.1)

## 2020-02-12 LAB — TSH: TSH: 3.88 mIU/L (ref 0.40–4.50)

## 2020-02-12 LAB — T4, FREE: Free T4: 0.9 ng/dL (ref 0.8–1.8)

## 2020-02-18 ENCOUNTER — Encounter: Payer: Self-pay | Admitting: "Endocrinology

## 2020-02-18 ENCOUNTER — Other Ambulatory Visit: Payer: Self-pay

## 2020-02-18 ENCOUNTER — Ambulatory Visit (INDEPENDENT_AMBULATORY_CARE_PROVIDER_SITE_OTHER): Payer: Medicare Other | Admitting: "Endocrinology

## 2020-02-18 VITALS — BP 118/76 | HR 76 | Ht 61.0 in | Wt 216.2 lb

## 2020-02-18 DIAGNOSIS — I10 Essential (primary) hypertension: Secondary | ICD-10-CM

## 2020-02-18 DIAGNOSIS — F17200 Nicotine dependence, unspecified, uncomplicated: Secondary | ICD-10-CM | POA: Diagnosis not present

## 2020-02-18 DIAGNOSIS — E1169 Type 2 diabetes mellitus with other specified complication: Secondary | ICD-10-CM

## 2020-02-18 DIAGNOSIS — E669 Obesity, unspecified: Secondary | ICD-10-CM

## 2020-02-18 DIAGNOSIS — E782 Mixed hyperlipidemia: Secondary | ICD-10-CM | POA: Diagnosis not present

## 2020-02-18 NOTE — Patient Instructions (Signed)

## 2020-02-18 NOTE — Progress Notes (Signed)
02/18/2020   Endocrinology follow-up note   Subjective:    Patient ID: Victoria Lara, female    DOB: 16-Jun-1956.  She is here in follow-up for the management of  symptomatic type 2 diabetes, hyperlipidemia, hypertension.  PMD:   Juliette Alcide, MD  Past Medical History:  Diagnosis Date  . Anemia   . Anxiety   . Arthritis   . Bronchitis   . Complication of anesthesia    had to come back to ER after shoulder surgery at [facility]; hard to wake up; low O2 sats  . Depression   . Diabetes mellitus without complication (HCC)   . GERD (gastroesophageal reflux disease)   . Hyperlipidemia   . Hypertension   . Neuropathy    Takes Gabapentin  . Pneumonia 2016  . Restless leg syndrome   . Seasonal allergies   . Shortness of breath dyspnea   . Sleep apnea   . Syncope   . TIA (transient ischemic attack) 2006   Past Surgical History:  Procedure Laterality Date  . BREAST LUMPECTOMY Bilateral   . CATARACT EXTRACTION W/PHACO Right 08/20/2014   Procedure: CATARACT EXTRACTION PHACO AND INTRAOCULAR LENS PLACEMENT (IOC);  Surgeon: Gemma Payor, MD;  Location: [facility];  Service: Ophthalmology;  Laterality: Right;  CDE:5.31  . CATARACT EXTRACTION W/PHACO Left 01/19/2020   Procedure: CATARACT EXTRACTION PHACO AND INTRAOCULAR LENS PLACEMENT (IOC);  Surgeon: Fabio Pierce, MD;  Location: [facility];  Service: Ophthalmology;  Laterality: Left;  CDE: 12.32  . HAND SURGERY Bilateral   . NASAL SEPTOPLASTY W/ TURBINOPLASTY Bilateral 01/26/2016   Procedure: NASAL SEPTOPLASTY WITH BILATERAL TURBINATE REDUCTION;  Surgeon: Newman Pies, MD;  Location: [facility];  Service: ENT;  Laterality: Bilateral;  . NASAL SEPTUM SURGERY  01/26/2016  . NASAL SINUS SURGERY Bilateral 01/26/2016   Procedure: BILATERAL ENDOSCOPIC MAXILLARY ANTROSTOMY;  Surgeon: Newman Pies, MD;  Location: [facility];  Service: ENT;  Laterality: Bilateral;  . SHOULDER ARTHROSCOPY WITH ROTATOR CUFF REPAIR Right   . TUBAL LIGATION     Social History    Socioeconomic History  . Marital status: Widowed    Spouse name: Not on file  . Number of children: 1  . Years of education: 83  . Highest education level: Not on file  Occupational History  . Occupation: Unemployed  Tobacco Use  . Smoking status: Current Every Day Smoker    Packs/day: 0.10    Years: 44.00    Pack years: 4.40    Types: Cigarettes  . Smokeless tobacco: Never Used  Vaping Use  . Vaping Use: Never used  Substance and Sexual Activity  . Alcohol use: No    Alcohol/week: 0.0 standard drinks  . Drug use: No  . Sexual activity: Not on file  Other Topics Concern  . Not on file  Social History Narrative   Lives with boyfriend   Caffeine use: Drinks 1 soda a day    Social Determinants of Health   Financial Resource Strain:   . Difficulty of Paying Living Expenses: Not on file  Food Insecurity:   . Worried About Programme researcher, broadcasting/film/video in the Last Year: Not on file  . Ran Out of Food in the Last Year: Not on file  Transportation Needs:   . Lack of Transportation (Medical): Not on file  . Lack of Transportation (Non-Medical): Not on file  Physical Activity:   . Days of Exercise per Week: Not on file  . Minutes of Exercise per Session: Not on file  Stress:   . Feeling of Stress : Not on file  Social Connections:   . Frequency of Communication with Friends and Family: Not on file  . Frequency of Social Gatherings with Friends and Family: Not on file  . Attends Religious Services: Not on file  . Active Member of Clubs or Organizations: Not on file  . Attends BankerClub or Organization Meetings: Not on file  . Marital Status: Not on file   Outpatient Encounter Medications as of 02/18/2020  Medication Sig  . ACCU-CHEK AVIVA PLUS test strip USE TWICE DAILY TO CHECK SUGAR  . acetaminophen (TYLENOL) 500 MG tablet Take 1,500 mg by mouth 2 (two) times daily as needed for moderate pain or headache.  . albuterol (PROVENTIL) (2.5 MG/3ML) 0.083% nebulizer solution Take 3 mLs  (2.5 mg total) by nebulization every 6 (six) hours as needed for wheezing.  Marland Kitchen. aspirin EC 81 MG tablet Take 81 mg by mouth daily.  Marland Kitchen. atorvastatin (LIPITOR) 20 MG tablet TAKE ONE TABLET BY MOUTH DAILY (Patient taking differently: Take 20 mg by mouth daily. )  . Blood Glucose Monitoring Suppl (ACCU-CHEK AVIVA) device Use as instructed  . buPROPion (WELLBUTRIN SR) 150 MG 12 hr tablet Take 150 mg by mouth 2 (two) times daily.  . cetirizine (ZYRTEC) 10 MG tablet Take 10 mg by mouth daily.  . citalopram (CELEXA) 20 MG tablet Take 20 mg by mouth daily.  . clonazePAM (KLONOPIN) 0.5 MG tablet Take 0.5 mg by mouth every 8 (eight) hours as needed for anxiety.   . fluticasone (FLONASE) 50 MCG/ACT nasal spray Place 1 spray into both nostrils daily.  Marland Kitchen. gabapentin (NEURONTIN) 300 MG capsule Take 300 mg by mouth 2 (two) times daily.  Marland Kitchen. glucose blood (ACCU-CHEK AVIVA) test strip To test 4 times a day  . hydrochlorothiazide (HYDRODIURIL) 25 MG tablet Take 25 mg by mouth daily.  . metFORMIN (GLUCOPHAGE) 850 MG tablet Take 850 mg by mouth 2 (two) times daily with a meal.  . montelukast (SINGULAIR) 10 MG tablet Take 10 mg by mouth at bedtime.  Marland Kitchen. omeprazole (PRILOSEC) 20 MG capsule Take 20 mg by mouth daily.  Marland Kitchen. PROAIR HFA 108 (90 Base) MCG/ACT inhaler Inhale 2 puffs into the lungs every 4 (four) hours as needed for wheezing or shortness of breath.   Marland Kitchen. rOPINIRole (REQUIP) 0.25 MG tablet Take 0.25 mg by mouth See admin instructions. Take 0.25 mg at night may take a second 0.25 mg dose as needed for restless legs  . Tiotropium Bromide Monohydrate (SPIRIVA RESPIMAT) 2.5 MCG/ACT AERS Inhale 2 puffs into the lungs daily.  Nathen May. TOUJEO SOLOSTAR 300 UNIT/ML Solostar Pen INJECT 80 UNITS INTO THE SKIN AT BEDTIME  . ULTICARE MINI PEN NEEDLES 31G X 6 MM MISC USE AS DIRECTED ONCE DAILY.  . valsartan (DIOVAN) 160 MG tablet Take 160 mg by mouth daily.   No facility-administered encounter medications on file as of 02/18/2020.    ALLERGIES: Allergies  Allergen Reactions  . Lisinopril Cough   VACCINATION STATUS: Immunization History  Administered Date(s) Administered  . Influenza,inj,Quad PF,6+ Mos 03/26/2018    Diabetes She presents for her follow-up diabetic visit. She has type 2 diabetes mellitus. Onset time: She was diagnosed at approximate age of 44 years. Her disease course has been improving. There are no hypoglycemic associated symptoms. Pertinent negatives for hypoglycemia include no confusion, headaches, pallor or seizures. Associated symptoms include polydipsia and polyuria. Pertinent negatives for diabetes include no blurred vision, no chest pain, no fatigue and no  polyphagia. There are no hypoglycemic complications. Symptoms are stable. Diabetic complications include a CVA. (She is a chronic heavy smoker.) Risk factors for coronary artery disease include diabetes mellitus, dyslipidemia, hypertension, obesity, sedentary lifestyle and tobacco exposure. Current diabetic treatment includes oral agent (monotherapy). Her weight is fluctuating minimally. She is following a generally unhealthy diet. When asked about meal planning, she reported none. She rarely participates in exercise. Her home blood glucose trend is fluctuating minimally. Her breakfast blood glucose range is generally 140-180 mg/dl. Her bedtime blood glucose range is generally 140-180 mg/dl. Her overall blood glucose range is 140-180 mg/dl. (She presents with above target glycemic profile at fasting, point-of-care A1c today 7.8% increasing from 7%.) An ACE inhibitor/angiotensin II receptor blocker is being taken. Eye exam is current.  Hyperlipidemia This is a chronic problem. The current episode started more than 1 year ago. The problem is controlled. Recent lipid tests were reviewed and are high. Exacerbating diseases include diabetes and obesity. Associated symptoms include shortness of breath. Pertinent negatives include no chest pain or myalgias.  Current antihyperlipidemic treatment includes statins. Risk factors for coronary artery disease include diabetes mellitus, dyslipidemia, hypertension, a sedentary lifestyle, obesity and post-menopausal.  Hypertension This is a chronic problem. The current episode started more than 1 year ago. The problem is controlled. Associated symptoms include shortness of breath. Pertinent negatives include no blurred vision, chest pain, headaches or palpitations. Risk factors for coronary artery disease include diabetes mellitus, dyslipidemia, sedentary lifestyle, smoking/tobacco exposure, post-menopausal state, family history and obesity. Past treatments include ACE inhibitors. Hypertensive end-organ damage includes CVA.    Review of systems  Constitutional: + Minimally fluctuating body weight,  current  Body mass index is 40.85 kg/m. , no fatigue, no subjective hyperthermia, no subjective hypothermia Eyes: no blurry vision, no xerophthalmia ENT: no sore throat, no nodules palpated in throat, no dysphagia/odynophagia, no hoarseness Cardiovascular: no Chest Pain, no Shortness of Breath, no palpitations, no leg swelling Respiratory: + cough, + shortness of breath on exertion Gastrointestinal: no Nausea/Vomiting/Diarhhea Musculoskeletal: no muscle/joint aches Skin: no rashes, no hyperemia Neurological: no tremors, no numbness, no tingling, no dizziness Psychiatric: no depression, no anxiety   Objective:    BP 118/76   Pulse 76   Ht 5\' 1"  (1.549 m)   Wt 216 lb 3.2 oz (98.1 kg)   BMI 40.85 kg/m   Wt Readings from Last 3 Encounters:  02/18/20 216 lb 3.2 oz (98.1 kg)  01/16/20 (!) 213 lb (96.6 kg)  10/31/19 214 lb (97.1 kg)     Physical Exam- Limited  Constitutional:  Body mass index is 40.85 kg/m. , not in acute distress, normal state of mind Eyes:  EOMI, no exophthalmos Neck: Supple Thyroid: No gross goiter Respiratory: Adequate breathing efforts Musculoskeletal: no gross deformities,  strength intact in all four extremities, no gross restriction of joint movements Skin:  no rashes, no hyperemia Neurological: no tremor with outstretched hands    CMP     Component Value Date/Time   NA 141 02/11/2020 0727   NA 140 11/24/2015 1624   K 4.6 02/11/2020 0727   CL 101 02/11/2020 0727   CO2 36 (H) 02/11/2020 0727   GLUCOSE 160 (H) 02/11/2020 0727   BUN 21 02/11/2020 0727   BUN 18 11/01/2018 0000   CREATININE 0.89 02/11/2020 0727   CALCIUM 9.3 02/11/2020 0727   PROT 6.4 02/11/2020 0727   PROT 6.6 11/24/2015 1624   ALBUMIN 4.2 11/24/2015 1624   AST 18 02/11/2020 0727   ALT 17  02/11/2020 0727   ALKPHOS 79 11/24/2015 1624   BILITOT 0.3 02/11/2020 0727   BILITOT 0.4 11/24/2015 1624   GFRNONAA 68 02/11/2020 0727   GFRAA 79 02/11/2020 0727     Diabetic Labs (most recent): Lab Results  Component Value Date   HGBA1C 7.2 (H) 01/16/2020   HGBA1C 7.8 (A) 10/31/2019   HGBA1C 7.0 02/03/2019   Lipid Panel     Component Value Date/Time   CHOL 126 02/11/2020 0727   TRIG 122 02/11/2020 0727   HDL 38 (L) 02/11/2020 0727   CHOLHDL 3.3 02/11/2020 0727   LDLCALC 68 02/11/2020 0727      Assessment & Plan:   1. Uncontrolled type 2 diabetes mellitus with other circulatory complication, without long-term current use of insulin (HCC)   - Patient has currently improving  type 2 DM since  64 years of age.  She presents with controlled glycemic profile at fasting and point-of-care A1c of 7.2% improving from 7.8%.   - Recent labs reviewed.   Her diabetes is complicated by TIA, chronic heavy smoking and patient remains at a high risk for more acute and chronic complications of diabetes which include CAD, CVA, CKD, retinopathy, and neuropathy. These are all discussed in detail with the patient.  - I have counseled the patient on diet management and weight loss, by adopting a carbohydrate restricted/protein rich diet.  - she  admits there is a room for improvement in her diet  and drink choices. -  Suggestion is made for her to avoid simple carbohydrates  from her diet including Cakes, Sweet Desserts / Pastries, Ice Cream, Soda (diet and regular), Sweet Tea, Candies, Chips, Cookies, Sweet Pastries,  Store Bought Juices, Alcohol in Excess of  1-2 drinks a day, Artificial Sweeteners, Coffee Creamer, and "Sugar-free" Products. This will help patient to have stable blood glucose profile and potentially avoid unintended weight gain.  - I encouraged the patient to switch to  unprocessed or minimally processed complex starch and increased protein intake (animal or plant source), fruits, and vegetables.  - Patient is advised to stick to a routine mealtimes to eat 3 meals  a day and avoid unnecessary snacks ( to snack only to correct hypoglycemia).   - I have approached patient with the following individualized plan to manage diabetes and patient agrees:    -Based on her presentation with near target glycemic profile at fasting, she will not need bolus insulin for now.   -She is advised to continue Toujeo 80 units nightly,  associated with monitoring of blood glucose before breakfast and at bedtime.  -Patient is encouraged to call clinic for blood glucose levels less than 70 or above 200 mg /dl.  -She is advised to continue metformin 850 mg by mouth twice a day, therapeutically suitable for patient.  - Patient specific target  A1c;  LDL, HDL, Triglycerides,  were discussed in detail.  2) BP/HTN:  Her blood pressure is controlled to target.  She is advised to continue her current blood pressure medications including lisinopril 20 mg p.o. daily.   3) Lipids/HPL: Her recent lipid panel showed controlled LDL at 60, improving from 99, triglycerides improving to 120 improving from 213, I advised her to continue atorvastatin 20 mg p.o. nightly.   Side effects and precautions discussed with her.    4)  Weight/Diet: Her BMI is 40.4-clearly complicating her diabetes care.  She is a  candidate for modest weight loss.  She did not have any further success in weight loss,  she still admits significant  dietary indiscretion, CDE Consult  Is in progress  , exercise, and detailed carbohydrates information provided.  5) Chronic Care/Health Maintenance:  -Patient is on ACEI and Statin medications and encouraged to continue to follow up with Ophthalmology, Podiatrist at least yearly or according to recommendations, and advised to  quit smoking. I have recommended yearly flu vaccine and pneumonia vaccination at least every 5 years; moderate intensity exercise for up to 150 minutes weekly; and  sleep for at least 7 hours a day.  The patient was counseled on the dangers of tobacco use, and was advised to quit.  Reviewed strategies to maximize success, including removing cigarettes and smoking materials from environment.   - I advised patient to maintain close follow up with Burdine, Ananias Pilgrim, MD for primary care needs.  - Time spent on this patient care encounter:  35 min, of which > 50% was spent in  counseling and the rest reviewing her blood glucose logs , discussing her hypoglycemia and hyperglycemia episodes, reviewing her current and  previous labs / studies  ( including abstraction from other facilities) and medications  doses and developing a  long term treatment plan and documenting her care.   Please refer to Patient Instructions for Blood Glucose Monitoring and Insulin/Medications Dosing Guide"  in media tab for additional information. Please  also refer to " Patient Self Inventory" in the Media  tab for reviewed elements of pertinent patient history.  Lurlean Horns participated in the discussions, expressed understanding, and voiced agreement with the above plans.  All questions were answered to her satisfaction. she is encouraged to contact clinic should she have any questions or concerns prior to her return visit.   Follow up plan: - Return in about 9 weeks (around  04/21/2020), or in person, for Bring Meter and Logs- A1c in Office.  Marquis Lunch, MD Phone: [phone]  Fax: [phone]   -  This note was partially dictated with voice recognition software. Similar sounding words can be transcribed inadequately or may not  be corrected upon review.  02/18/2020, 4:21 PM

## 2020-02-24 DIAGNOSIS — I10 Essential (primary) hypertension: Secondary | ICD-10-CM | POA: Diagnosis not present

## 2020-02-24 DIAGNOSIS — J441 Chronic obstructive pulmonary disease with (acute) exacerbation: Secondary | ICD-10-CM | POA: Diagnosis not present

## 2020-02-24 DIAGNOSIS — E1165 Type 2 diabetes mellitus with hyperglycemia: Secondary | ICD-10-CM | POA: Diagnosis not present

## 2020-02-24 DIAGNOSIS — Z794 Long term (current) use of insulin: Secondary | ICD-10-CM | POA: Diagnosis not present

## 2020-02-26 DIAGNOSIS — N6320 Unspecified lump in the left breast, unspecified quadrant: Secondary | ICD-10-CM | POA: Diagnosis not present

## 2020-03-04 DIAGNOSIS — N6320 Unspecified lump in the left breast, unspecified quadrant: Secondary | ICD-10-CM | POA: Diagnosis not present

## 2020-03-04 DIAGNOSIS — N611 Abscess of the breast and nipple: Secondary | ICD-10-CM | POA: Diagnosis not present

## 2020-03-10 DIAGNOSIS — J449 Chronic obstructive pulmonary disease, unspecified: Secondary | ICD-10-CM | POA: Diagnosis not present

## 2020-03-24 DIAGNOSIS — N6489 Other specified disorders of breast: Secondary | ICD-10-CM | POA: Diagnosis not present

## 2020-03-24 DIAGNOSIS — N6320 Unspecified lump in the left breast, unspecified quadrant: Secondary | ICD-10-CM | POA: Diagnosis not present

## 2020-03-24 DIAGNOSIS — R928 Other abnormal and inconclusive findings on diagnostic imaging of breast: Secondary | ICD-10-CM | POA: Diagnosis not present

## 2020-03-25 DIAGNOSIS — J441 Chronic obstructive pulmonary disease with (acute) exacerbation: Secondary | ICD-10-CM | POA: Diagnosis not present

## 2020-03-25 DIAGNOSIS — E1165 Type 2 diabetes mellitus with hyperglycemia: Secondary | ICD-10-CM | POA: Diagnosis not present

## 2020-03-25 DIAGNOSIS — Z720 Tobacco use: Secondary | ICD-10-CM | POA: Diagnosis not present

## 2020-03-25 DIAGNOSIS — Z794 Long term (current) use of insulin: Secondary | ICD-10-CM | POA: Diagnosis not present

## 2020-03-25 DIAGNOSIS — I10 Essential (primary) hypertension: Secondary | ICD-10-CM | POA: Diagnosis not present

## 2020-04-09 DIAGNOSIS — J449 Chronic obstructive pulmonary disease, unspecified: Secondary | ICD-10-CM | POA: Diagnosis not present

## 2020-04-16 DIAGNOSIS — H5213 Myopia, bilateral: Secondary | ICD-10-CM | POA: Diagnosis not present

## 2020-04-22 ENCOUNTER — Ambulatory Visit: Payer: Medicare Other | Admitting: "Endocrinology

## 2020-04-24 DIAGNOSIS — Z794 Long term (current) use of insulin: Secondary | ICD-10-CM | POA: Diagnosis not present

## 2020-04-24 DIAGNOSIS — I10 Essential (primary) hypertension: Secondary | ICD-10-CM | POA: Diagnosis not present

## 2020-04-24 DIAGNOSIS — E1165 Type 2 diabetes mellitus with hyperglycemia: Secondary | ICD-10-CM | POA: Diagnosis not present

## 2020-04-24 DIAGNOSIS — J441 Chronic obstructive pulmonary disease with (acute) exacerbation: Secondary | ICD-10-CM | POA: Diagnosis not present

## 2020-05-04 ENCOUNTER — Encounter: Payer: Self-pay | Admitting: "Endocrinology

## 2020-05-04 ENCOUNTER — Ambulatory Visit (INDEPENDENT_AMBULATORY_CARE_PROVIDER_SITE_OTHER): Payer: Medicare Other | Admitting: "Endocrinology

## 2020-05-04 ENCOUNTER — Other Ambulatory Visit: Payer: Self-pay

## 2020-05-04 VITALS — BP 144/92 | HR 88 | Ht 61.0 in | Wt 217.6 lb

## 2020-05-04 DIAGNOSIS — E669 Obesity, unspecified: Secondary | ICD-10-CM | POA: Diagnosis not present

## 2020-05-04 DIAGNOSIS — F17200 Nicotine dependence, unspecified, uncomplicated: Secondary | ICD-10-CM

## 2020-05-04 DIAGNOSIS — I10 Essential (primary) hypertension: Secondary | ICD-10-CM

## 2020-05-04 DIAGNOSIS — E1169 Type 2 diabetes mellitus with other specified complication: Secondary | ICD-10-CM | POA: Diagnosis not present

## 2020-05-04 DIAGNOSIS — E782 Mixed hyperlipidemia: Secondary | ICD-10-CM | POA: Diagnosis not present

## 2020-05-04 LAB — HEMOGLOBIN A1C: Hemoglobin A1C: 7.2

## 2020-05-04 MED ORDER — TOUJEO SOLOSTAR 300 UNIT/ML ~~LOC~~ SOPN: 80.0000 [IU] | PEN_INJECTOR | Freq: Every day | SUBCUTANEOUS | 2 refills | Status: DC

## 2020-05-04 NOTE — Patient Instructions (Signed)

## 2020-05-04 NOTE — Progress Notes (Signed)
05/04/2020   Endocrinology follow-up note   Subjective:    Patient ID: Victoria Lara, female    DOB: Oct 11, 1955.  She is here in follow-up for the management of symptomatic type 2 diabetes, hyperlipidemia, hypertension.    PMD:   Juliette Alcide, MD  Past Medical History:  Diagnosis Date  . Anemia   . Anxiety   . Arthritis   . Bronchitis   . Complication of anesthesia    had to come back to ER after shoulder surgery at [facility]; hard to wake up; low O2 sats  . Depression   . Diabetes mellitus without complication (HCC)   . GERD (gastroesophageal reflux disease)   . Hyperlipidemia   . Hypertension   . Neuropathy    Takes Gabapentin  . Pneumonia 2016  . Restless leg syndrome   . Seasonal allergies   . Shortness of breath dyspnea   . Sleep apnea   . Syncope   . TIA (transient ischemic attack) 2006   Past Surgical History:  Procedure Laterality Date  . BREAST LUMPECTOMY Bilateral   . CATARACT EXTRACTION W/PHACO Right 08/20/2014   Procedure: CATARACT EXTRACTION PHACO AND INTRAOCULAR LENS PLACEMENT (IOC);  Surgeon: Gemma Payor, MD;  Location: [facility];  Service: Ophthalmology;  Laterality: Right;  CDE:5.31  . CATARACT EXTRACTION W/PHACO Left 01/19/2020   Procedure: CATARACT EXTRACTION PHACO AND INTRAOCULAR LENS PLACEMENT (IOC);  Surgeon: Fabio Pierce, MD;  Location: [facility];  Service: Ophthalmology;  Laterality: Left;  CDE: 12.32  . HAND SURGERY Bilateral   . NASAL SEPTOPLASTY W/ TURBINOPLASTY Bilateral 01/26/2016   Procedure: NASAL SEPTOPLASTY WITH BILATERAL TURBINATE REDUCTION;  Surgeon: Newman Pies, MD;  Location: [facility];  Service: ENT;  Laterality: Bilateral;  . NASAL SEPTUM SURGERY  01/26/2016  . NASAL SINUS SURGERY Bilateral 01/26/2016   Procedure: BILATERAL ENDOSCOPIC MAXILLARY ANTROSTOMY;  Surgeon: Newman Pies, MD;  Location: [facility];  Service: ENT;  Laterality: Bilateral;  . SHOULDER ARTHROSCOPY WITH ROTATOR CUFF REPAIR Right   . TUBAL LIGATION     Social History    Socioeconomic History  . Marital status: Widowed    Spouse name: Not on file  . Number of children: 1  . Years of education: 43  . Highest education level: Not on file  Occupational History  . Occupation: Unemployed  Tobacco Use  . Smoking status: Current Every Day Smoker    Packs/day: 0.10    Years: 44.00    Pack years: 4.40    Types: Cigarettes  . Smokeless tobacco: Never Used  Vaping Use  . Vaping Use: Never used  Substance and Sexual Activity  . Alcohol use: No    Alcohol/week: 0.0 standard drinks  . Drug use: No  . Sexual activity: Not on file  Other Topics Concern  . Not on file  Social History Narrative   Lives with boyfriend   Caffeine use: Drinks 1 soda a day    Social Determinants of Health   Financial Resource Strain:   . Difficulty of Paying Living Expenses: Not on file  Food Insecurity:   . Worried About Programme researcher, broadcasting/film/video in the Last Year: Not on file  . Ran Out of Food in the Last Year: Not on file  Transportation Needs:   . Lack of Transportation (Medical): Not on file  . Lack of Transportation (Non-Medical): Not on file  Physical Activity:   . Days of Exercise per Week: Not on file  . Minutes of Exercise per Session: Not on  file  Stress:   . Feeling of Stress : Not on file  Social Connections:   . Frequency of Communication with Friends and Family: Not on file  . Frequency of Social Gatherings with Friends and Family: Not on file  . Attends Religious Services: Not on file  . Active Member of Clubs or Organizations: Not on file  . Attends Banker Meetings: Not on file  . Marital Status: Not on file   Outpatient Encounter Medications as of 05/04/2020  Medication Sig  . ACCU-CHEK AVIVA PLUS test strip USE TWICE DAILY TO CHECK SUGAR  . acetaminophen (TYLENOL) 500 MG tablet Take 1,500 mg by mouth 2 (two) times daily as needed for moderate pain or headache.  . albuterol (PROVENTIL) (2.5 MG/3ML) 0.083% nebulizer solution Take 3 mLs  (2.5 mg total) by nebulization every 6 (six) hours as needed for wheezing.  Marland Kitchen aspirin EC 81 MG tablet Take 81 mg by mouth daily.  Marland Kitchen atorvastatin (LIPITOR) 20 MG tablet TAKE ONE TABLET BY MOUTH DAILY (Patient taking differently: Take 20 mg by mouth daily. )  . Blood Glucose Monitoring Suppl (ACCU-CHEK AVIVA) device Use as instructed  . buPROPion (WELLBUTRIN SR) 150 MG 12 hr tablet Take 150 mg by mouth 2 (two) times daily.  . cetirizine (ZYRTEC) 10 MG tablet Take 10 mg by mouth daily.  . citalopram (CELEXA) 20 MG tablet Take 20 mg by mouth daily.  . clonazePAM (KLONOPIN) 0.5 MG tablet Take 0.5 mg by mouth every 8 (eight) hours as needed for anxiety.   . fluticasone (FLONASE) 50 MCG/ACT nasal spray Place 1 spray into both nostrils daily.  Marland Kitchen gabapentin (NEURONTIN) 300 MG capsule Take 300 mg by mouth 2 (two) times daily.  Marland Kitchen glucose blood (ACCU-CHEK AVIVA) test strip To test 4 times a day  . hydrochlorothiazide (HYDRODIURIL) 25 MG tablet Take 25 mg by mouth daily.  . insulin glargine, 1 Unit Dial, (TOUJEO SOLOSTAR) 300 UNIT/ML Solostar Pen Inject 80 Units into the skin at bedtime.  . metFORMIN (GLUCOPHAGE) 850 MG tablet Take 850 mg by mouth 2 (two) times daily with a meal.  . montelukast (SINGULAIR) 10 MG tablet Take 10 mg by mouth at bedtime.  Marland Kitchen omeprazole (PRILOSEC) 20 MG capsule Take 20 mg by mouth daily.  Marland Kitchen PROAIR HFA 108 (90 Base) MCG/ACT inhaler Inhale 2 puffs into the lungs every 4 (four) hours as needed for wheezing or shortness of breath.   Marland Kitchen rOPINIRole (REQUIP) 0.25 MG tablet Take 0.25 mg by mouth See admin instructions. Take 0.25 mg at night may take a second 0.25 mg dose as needed for restless legs  . Tiotropium Bromide Monohydrate (SPIRIVA RESPIMAT) 2.5 MCG/ACT AERS Inhale 2 puffs into the lungs daily.  Marland Kitchen ULTICARE MINI PEN NEEDLES 31G X 6 MM MISC USE AS DIRECTED ONCE DAILY.  . valsartan (DIOVAN) 160 MG tablet Take 160 mg by mouth daily.  . [DISCONTINUED] TOUJEO SOLOSTAR 300 UNIT/ML  Solostar Pen INJECT 80 UNITS INTO THE SKIN AT BEDTIME   No facility-administered encounter medications on file as of 05/04/2020.   ALLERGIES: Allergies  Allergen Reactions  . Lisinopril Cough   VACCINATION STATUS: Immunization History  Administered Date(s) Administered  . Influenza,inj,Quad PF,6+ Mos 03/26/2018    Diabetes She presents for her follow-up diabetic visit. She has type 2 diabetes mellitus. Onset time: She was diagnosed at approximate age of 44 years. Her disease course has been improving. There are no hypoglycemic associated symptoms. Pertinent negatives for hypoglycemia include no confusion, headaches,  pallor or seizures. Associated symptoms include polydipsia and polyuria. Pertinent negatives for diabetes include no blurred vision, no chest pain, no fatigue and no polyphagia. There are no hypoglycemic complications. Symptoms are stable. Diabetic complications include a CVA. (She is a chronic heavy smoker.) Risk factors for coronary artery disease include diabetes mellitus, dyslipidemia, hypertension, obesity, sedentary lifestyle and tobacco exposure. Current diabetic treatment includes oral agent (monotherapy). Her weight is fluctuating minimally. She is following a generally unhealthy diet. When asked about meal planning, she reported none. She rarely participates in exercise. Her home blood glucose trend is fluctuating minimally. Her breakfast blood glucose range is generally 140-180 mg/dl. Her bedtime blood glucose range is generally 140-180 mg/dl. Her overall blood glucose range is 140-180 mg/dl. (She presents with above target glycemic profile at fasting, point-of-care A1c today 7.8% increasing from 7%.) An ACE inhibitor/angiotensin II receptor blocker is being taken. Eye exam is current.  Hyperlipidemia This is a chronic problem. The current episode started more than 1 year ago. The problem is controlled. Recent lipid tests were reviewed and are high. Exacerbating diseases  include diabetes and obesity. Associated symptoms include shortness of breath. Pertinent negatives include no chest pain or myalgias. Current antihyperlipidemic treatment includes statins. Risk factors for coronary artery disease include diabetes mellitus, dyslipidemia, hypertension, a sedentary lifestyle, obesity and post-menopausal.  Hypertension This is a chronic problem. The current episode started more than 1 year ago. The problem is controlled. Associated symptoms include shortness of breath. Pertinent negatives include no blurred vision, chest pain, headaches or palpitations. Risk factors for coronary artery disease include diabetes mellitus, dyslipidemia, sedentary lifestyle, smoking/tobacco exposure, post-menopausal state, family history and obesity. Past treatments include ACE inhibitors. Hypertensive end-organ damage includes CVA.    Review of systems  Limited as above.   Objective:    BP (!) 144/92   Pulse 88   Ht 5\' 1"  (1.549 m)   Wt 217 lb 9.6 oz (98.7 kg)   BMI 41.12 kg/m   Wt Readings from Last 3 Encounters:  05/04/20 217 lb 9.6 oz (98.7 kg)  02/18/20 216 lb 3.2 oz (98.1 kg)  01/16/20 (!) 213 lb (96.6 kg)     Physical Exam- Limited  Constitutional:  Body mass index is 41.12 kg/m. , not in acute distress, normal state of mind Eyes:  EOMI, no exophthalmos Neck: Supple Thyroid: No gross goiter Respiratory: Adequate breathing efforts Musculoskeletal: no gross deformities, strength intact in all four extremities, no gross restriction of joint movements Skin:  no rashes, no hyperemia Neurological: no tremor with outstretched hands    CMP     Component Value Date/Time   NA 141 02/11/2020 0727   NA 140 11/24/2015 1624   K 4.6 02/11/2020 0727   CL 101 02/11/2020 0727   CO2 36 (H) 02/11/2020 0727   GLUCOSE 160 (H) 02/11/2020 0727   BUN 21 02/11/2020 0727   BUN 18 11/01/2018 0000   CREATININE 0.89 02/11/2020 0727   CALCIUM 9.3 02/11/2020 0727   PROT 6.4  02/11/2020 0727   PROT 6.6 11/24/2015 1624   ALBUMIN 4.2 11/24/2015 1624   AST 18 02/11/2020 0727   ALT 17 02/11/2020 0727   ALKPHOS 79 11/24/2015 1624   BILITOT 0.3 02/11/2020 0727   BILITOT 0.4 11/24/2015 1624   GFRNONAA 68 02/11/2020 0727   GFRAA 79 02/11/2020 0727     Diabetic Labs (most recent): Lab Results  Component Value Date   HGBA1C 7.2 (H) 01/16/2020   HGBA1C 7.8 (A) 10/31/2019   HGBA1C  7.0 02/03/2019   Lipid Panel     Component Value Date/Time   CHOL 126 02/11/2020 0727   TRIG 122 02/11/2020 0727   HDL 38 (L) 02/11/2020 0727   CHOLHDL 3.3 02/11/2020 0727   LDLCALC 68 02/11/2020 0727      Assessment & Plan:   1. Uncontrolled type 2 diabetes mellitus with other circulatory complication, without long-term current use of insulin (HCC)   - Patient has currently improving  type 2 DM since  64 years of age.  She presents with controlled glycemic profile at fasting and postprandial.  Her point-of-care A1c is unchanged at 7.2%.   -No documented or reported hypoglycemia.  - Recent labs reviewed.   Her diabetes is complicated by TIA, chronic heavy smoking and patient remains at a high risk for more acute and chronic complications of diabetes which include CAD, CVA, CKD, retinopathy, and neuropathy. These are all discussed in detail with the patient.  - I have counseled the patient on diet management and weight loss, by adopting a carbohydrate restricted/protein rich diet.  - she  admits there is a room for improvement in her diet and drink choices. -  Suggestion is made for her to avoid simple carbohydrates  from her diet including Cakes, Sweet Desserts / Pastries, Ice Cream, Soda (diet and regular), Sweet Tea, Candies, Chips, Cookies, Sweet Pastries,  Store Bought Juices, Alcohol in Excess of  1-2 drinks a day, Artificial Sweeteners, Coffee Creamer, and "Sugar-free" Products. This will help patient to have stable blood glucose profile and potentially avoid unintended  weight gain.   - I encouraged the patient to switch to  unprocessed or minimally processed complex starch and increased protein intake (animal or plant source), fruits, and vegetables.  - Patient is advised to stick to a routine mealtimes to eat 3 meals  a day and avoid unnecessary snacks ( to snack only to correct hypoglycemia).   - I have approached patient with the following individualized plan to manage diabetes and patient agrees:    -Based on her presentation with near target glycemic profile at fasting, she will not need bolus insulin for now.  -She is advised to continue Toujeo 80 units nightly,  associated with monitoring of blood glucose before breakfast and at bedtime.  -Patient is encouraged to call clinic for blood glucose levels less than 70 or above 200 mg /dl.  -She is advised to continue metformin 850 mg by mouth twice a day, therapeutically suitable for patient.  - Patient specific target  A1c;  LDL, HDL, Triglycerides,  were discussed in detail.  2) BP/HTN:  Her blood pressure is not controlled to target.    She is advised to continue her current blood pressure medications including lisinopril 20 mg p.o. daily.   3) Lipids/HPL: Her recent lipid panel showed controlled LDL at 60, improving from 99, triglycerides improving to 120 improving from 213, she is advised to continue atorvastatin 20 mg p.o. nightly.   Side effects and precautions discussed with her.    4)  Weight/Diet: Her BMI is 41.1 -clearly complicating her diabetes care.  She is a candidate for modest weight loss.  She did not have any further success in weight loss, she still admits significant  dietary indiscretion, CDE Consult  Is in progress  , exercise, and detailed carbohydrates information provided.  5) Chronic Care/Health Maintenance:  -Patient is on ACEI and Statin medications and encouraged to continue to follow up with Ophthalmology, Podiatrist at least yearly or according to  recommendations, and  advised to  quit smoking. I have recommended yearly flu vaccine and pneumonia vaccination at least every 5 years; moderate intensity exercise for up to 150 minutes weekly; and  sleep for at least 7 hours a day.  The patient was counseled on the dangers of tobacco use, and was advised to quit.  Reviewed strategies to maximize success, including removing cigarettes and smoking materials from environment.    - I advised patient to maintain close follow up with Burdine, Ananias Pilgrim, MD for primary care needs.  - Time spent on this patient care encounter:  35 min, of which > 50% was spent in  counseling and the rest reviewing her blood glucose logs , discussing her hypoglycemia and hyperglycemia episodes, reviewing her current and  previous labs / studies  ( including abstraction from other facilities) and medications  doses and developing a  long term treatment plan and documenting her care.   Please refer to Patient Instructions for Blood Glucose Monitoring and Insulin/Medications Dosing Guide"  in media tab for additional information. Please  also refer to " Patient Self Inventory" in the Media  tab for reviewed elements of pertinent patient history.  Lurlean Horns participated in the discussions, expressed understanding, and voiced agreement with the above plans.  All questions were answered to her satisfaction. she is encouraged to contact clinic should she have any questions or concerns prior to her return visit.    Follow up plan: - Return in about 4 months (around 09/01/2020) for F/U with Pre-visit Labs, Meter, Logs, A1c here.Marquis Lunch, MD Phone: [phone]  Fax: [phone]   -  This note was partially dictated with voice recognition software. Similar sounding words can be transcribed inadequately or may not  be corrected upon review.  05/04/2020, 8:52 AM

## 2020-05-10 DIAGNOSIS — J449 Chronic obstructive pulmonary disease, unspecified: Secondary | ICD-10-CM | POA: Diagnosis not present

## 2020-05-25 DIAGNOSIS — E1165 Type 2 diabetes mellitus with hyperglycemia: Secondary | ICD-10-CM | POA: Diagnosis not present

## 2020-05-25 DIAGNOSIS — I10 Essential (primary) hypertension: Secondary | ICD-10-CM | POA: Diagnosis not present

## 2020-05-25 DIAGNOSIS — Z794 Long term (current) use of insulin: Secondary | ICD-10-CM | POA: Diagnosis not present

## 2020-05-26 DIAGNOSIS — E559 Vitamin D deficiency, unspecified: Secondary | ICD-10-CM | POA: Diagnosis not present

## 2020-05-26 DIAGNOSIS — E7800 Pure hypercholesterolemia, unspecified: Secondary | ICD-10-CM | POA: Diagnosis not present

## 2020-05-26 DIAGNOSIS — E1165 Type 2 diabetes mellitus with hyperglycemia: Secondary | ICD-10-CM | POA: Diagnosis not present

## 2020-05-26 DIAGNOSIS — J441 Chronic obstructive pulmonary disease with (acute) exacerbation: Secondary | ICD-10-CM | POA: Diagnosis not present

## 2020-05-31 DIAGNOSIS — Z23 Encounter for immunization: Secondary | ICD-10-CM | POA: Diagnosis not present

## 2020-05-31 DIAGNOSIS — N611 Abscess of the breast and nipple: Secondary | ICD-10-CM | POA: Diagnosis not present

## 2020-05-31 DIAGNOSIS — Z1389 Encounter for screening for other disorder: Secondary | ICD-10-CM | POA: Diagnosis not present

## 2020-05-31 DIAGNOSIS — J449 Chronic obstructive pulmonary disease, unspecified: Secondary | ICD-10-CM | POA: Diagnosis not present

## 2020-05-31 DIAGNOSIS — E1165 Type 2 diabetes mellitus with hyperglycemia: Secondary | ICD-10-CM | POA: Diagnosis not present

## 2020-05-31 DIAGNOSIS — Z0001 Encounter for general adult medical examination with abnormal findings: Secondary | ICD-10-CM | POA: Diagnosis not present

## 2020-05-31 DIAGNOSIS — I10 Essential (primary) hypertension: Secondary | ICD-10-CM | POA: Diagnosis not present

## 2020-06-02 DIAGNOSIS — R0789 Other chest pain: Secondary | ICD-10-CM | POA: Diagnosis not present

## 2020-06-02 DIAGNOSIS — R079 Chest pain, unspecified: Secondary | ICD-10-CM | POA: Diagnosis not present

## 2020-06-02 DIAGNOSIS — Z9181 History of falling: Secondary | ICD-10-CM | POA: Diagnosis not present

## 2020-06-02 DIAGNOSIS — R0781 Pleurodynia: Secondary | ICD-10-CM | POA: Diagnosis not present

## 2020-06-09 DIAGNOSIS — J449 Chronic obstructive pulmonary disease, unspecified: Secondary | ICD-10-CM | POA: Diagnosis not present

## 2020-06-11 DIAGNOSIS — H5213 Myopia, bilateral: Secondary | ICD-10-CM | POA: Diagnosis not present

## 2020-06-11 DIAGNOSIS — H52223 Regular astigmatism, bilateral: Secondary | ICD-10-CM | POA: Diagnosis not present

## 2020-06-25 DIAGNOSIS — E1165 Type 2 diabetes mellitus with hyperglycemia: Secondary | ICD-10-CM | POA: Diagnosis not present

## 2020-06-25 DIAGNOSIS — J441 Chronic obstructive pulmonary disease with (acute) exacerbation: Secondary | ICD-10-CM | POA: Diagnosis not present

## 2020-06-25 DIAGNOSIS — Z794 Long term (current) use of insulin: Secondary | ICD-10-CM | POA: Diagnosis not present

## 2020-06-25 DIAGNOSIS — I10 Essential (primary) hypertension: Secondary | ICD-10-CM | POA: Diagnosis not present

## 2020-07-10 DIAGNOSIS — J449 Chronic obstructive pulmonary disease, unspecified: Secondary | ICD-10-CM | POA: Diagnosis not present

## 2020-07-21 ENCOUNTER — Other Ambulatory Visit: Payer: Self-pay | Admitting: "Endocrinology

## 2020-08-04 ENCOUNTER — Other Ambulatory Visit: Payer: Self-pay | Admitting: "Endocrinology

## 2020-08-10 DIAGNOSIS — J449 Chronic obstructive pulmonary disease, unspecified: Secondary | ICD-10-CM | POA: Diagnosis not present

## 2020-08-18 DIAGNOSIS — J441 Chronic obstructive pulmonary disease with (acute) exacerbation: Secondary | ICD-10-CM | POA: Diagnosis not present

## 2020-08-18 DIAGNOSIS — J439 Emphysema, unspecified: Secondary | ICD-10-CM | POA: Diagnosis not present

## 2020-08-24 DIAGNOSIS — I10 Essential (primary) hypertension: Secondary | ICD-10-CM | POA: Diagnosis not present

## 2020-08-24 DIAGNOSIS — E1165 Type 2 diabetes mellitus with hyperglycemia: Secondary | ICD-10-CM | POA: Diagnosis not present

## 2020-08-24 LAB — COMPREHENSIVE METABOLIC PANEL
Albumin: 4.2 (ref 3.5–5.0)
Calcium: 10 (ref 8.7–10.7)
Globulin: 2.5

## 2020-08-24 LAB — BASIC METABOLIC PANEL
BUN: 24 — AB (ref 4–21)
CO2: 28 — AB (ref 13–22)
Chloride: 95 — AB (ref 99–108)
Creatinine: 1 (ref 0.5–1.1)
Glucose: 204
Potassium: 5.5 — AB (ref 3.4–5.3)
Sodium: 141 (ref 137–147)

## 2020-08-24 LAB — HEPATIC FUNCTION PANEL
ALT: 22 (ref 7–35)
AST: 19 (ref 13–35)
Alkaline Phosphatase: 77 (ref 25–125)
Bilirubin, Total: 0.4

## 2020-08-24 LAB — HEMOGLOBIN A1C: Hemoglobin A1C: 7.9

## 2020-08-27 DIAGNOSIS — J439 Emphysema, unspecified: Secondary | ICD-10-CM | POA: Diagnosis not present

## 2020-08-27 DIAGNOSIS — E875 Hyperkalemia: Secondary | ICD-10-CM | POA: Diagnosis not present

## 2020-08-27 DIAGNOSIS — E559 Vitamin D deficiency, unspecified: Secondary | ICD-10-CM | POA: Diagnosis not present

## 2020-08-27 DIAGNOSIS — I10 Essential (primary) hypertension: Secondary | ICD-10-CM | POA: Diagnosis not present

## 2020-08-27 DIAGNOSIS — E1165 Type 2 diabetes mellitus with hyperglycemia: Secondary | ICD-10-CM | POA: Diagnosis not present

## 2020-08-27 DIAGNOSIS — I700 Atherosclerosis of aorta: Secondary | ICD-10-CM | POA: Diagnosis not present

## 2020-08-27 DIAGNOSIS — J441 Chronic obstructive pulmonary disease with (acute) exacerbation: Secondary | ICD-10-CM | POA: Diagnosis not present

## 2020-09-01 ENCOUNTER — Other Ambulatory Visit: Payer: Self-pay

## 2020-09-01 ENCOUNTER — Encounter: Payer: Self-pay | Admitting: "Endocrinology

## 2020-09-01 ENCOUNTER — Ambulatory Visit (INDEPENDENT_AMBULATORY_CARE_PROVIDER_SITE_OTHER): Payer: Medicare Other | Admitting: "Endocrinology

## 2020-09-01 VITALS — BP 139/69 | HR 85 | Ht 61.0 in | Wt 218.2 lb

## 2020-09-01 DIAGNOSIS — E1169 Type 2 diabetes mellitus with other specified complication: Secondary | ICD-10-CM | POA: Diagnosis not present

## 2020-09-01 DIAGNOSIS — E782 Mixed hyperlipidemia: Secondary | ICD-10-CM

## 2020-09-01 DIAGNOSIS — I10 Essential (primary) hypertension: Secondary | ICD-10-CM | POA: Diagnosis not present

## 2020-09-01 DIAGNOSIS — F17200 Nicotine dependence, unspecified, uncomplicated: Secondary | ICD-10-CM | POA: Diagnosis not present

## 2020-09-01 DIAGNOSIS — E669 Obesity, unspecified: Secondary | ICD-10-CM

## 2020-09-01 MED ORDER — GLIPIZIDE ER 5 MG PO TB24
5.0000 mg | ORAL_TABLET | Freq: Every day | ORAL | 1 refills | Status: DC
Start: 2020-09-01 — End: 2020-11-15

## 2020-09-01 NOTE — Patient Instructions (Signed)
                                     Advice for Weight Management  -For most of us the best way to lose weight is by diet management. Generally speaking, diet management means consuming less calories intentionally which over time brings about progressive weight loss.  This can be achieved more effectively by restricting carbohydrate consumption to the minimum possible.  So, it is critically important to know your numbers: how much calorie you are consuming and how much calorie you need. More importantly, our carbohydrates sources should be unprocessed or minimally processed complex starch food items.   Sometimes, it is important to balance nutrition by increasing protein intake (animal or plant source), fruits, and vegetables.  -Sticking to a routine mealtime to eat 3 meals a day and avoiding unnecessary snacks is shown to have a big role in weight control. Under normal circumstances, the only time we lose real weight is when we are hungry, so allow hunger to take place- hunger means no food between meal times, only water.  It is not advisable to starve.   -It is better to avoid simple carbohydrates including: Cakes, Sweet Desserts, Ice Cream, Soda (diet and regular), Sweet Tea, Candies, Chips, Cookies, Store Bought Juices, Alcohol in Excess of  1-2 drinks a day, Lemonade,  Artificial Sweeteners, Doughnuts, Coffee Creamers, "Sugar-free" Products, etc, etc.  This is not a complete list.....    -Consulting with certified diabetes educators is proven to provide you with the most accurate and current information on diet.  Also, you may be  interested in discussing diet options/exchanges , we can schedule a visit with Victoria Lara, RDN, CDE for individualized nutrition education.  -Exercise: If you are able: 30 -60 minutes a day ,4 days a week, or 150 minutes a week.  The longer the better.  Combine stretch, strength, and aerobic activities.  If you were told in the  past that you have high risk for cardiovascular diseases, you may seek evaluation by your heart doctor prior to initiating moderate to intense exercise programs.                                  Additional Care Considerations for Diabetes   -Diabetes  is a chronic disease.  The most important care consideration is regular follow-up with your diabetes care provider with the goal being avoiding or delaying its complications and to take advantage of advances in medications and technology.    -Type 2 diabetes is known to coexist with other important comorbidities such as high blood pressure and high cholesterol.  It is critical to control not only the diabetes but also the high blood pressure and high cholesterol to minimize and delay the risk of complications including coronary artery disease, stroke, amputations, blindness, etc.    - Studies showed that people with diabetes will benefit from a class of medications known as ACE inhibitors and statins.  Unless there are specific reasons not to be on these medications, the standard of care is to consider getting one from these groups of medications at an optimal doses.  These medications are generally considered safe and proven to help protect the heart and the kidneys.    - People with diabetes are encouraged to initiate and maintain regular follow-up with eye doctors, foot   doctors, dentists , and if necessary heart and kidney doctors.     - It is highly recommended that people with diabetes quit smoking or stay away from smoking, and get yearly  flu vaccine and pneumonia vaccine at least every 5 years.  One other important lifestyle recommendation is to ensure adequate sleep - at least 6-7 hours of uninterrupted sleep at night.  -Exercise: If you are able: 30 -60 minutes a day, 4 days a week, or 150 minutes a week.  The longer the better.  Combine stretch, strength, and aerobic activities.  If you were told in the past that you have high risk for  cardiovascular diseases, you may seek evaluation by your heart doctor prior to initiating moderate to intense exercise programs.          

## 2020-09-01 NOTE — Progress Notes (Signed)
09/01/2020   Endocrinology follow-up note   Subjective:    Patient ID: Victoria Lara, female    DOB: 1955/12/08.  She is here in follow-up for the management of symptomatic type 2 diabetes, hyperlipidemia, hypertension.    PMD:   Juliette Alcide, MD  Past Medical History:  Diagnosis Date  . Anemia   . Anxiety   . Arthritis   . Bronchitis   . Complication of anesthesia    had to come back to ER after shoulder surgery at [facility]; hard to wake up; low O2 sats  . Depression   . Diabetes mellitus without complication (HCC)   . GERD (gastroesophageal reflux disease)   . Hyperlipidemia   . Hypertension   . Neuropathy    Takes Gabapentin  . Pneumonia 2016  . Restless leg syndrome   . Seasonal allergies   . Shortness of breath dyspnea   . Sleep apnea   . Syncope   . TIA (transient ischemic attack) 2006   Past Surgical History:  Procedure Laterality Date  . BREAST LUMPECTOMY Bilateral   . CATARACT EXTRACTION W/PHACO Right 08/20/2014   Procedure: CATARACT EXTRACTION PHACO AND INTRAOCULAR LENS PLACEMENT (IOC);  Surgeon: Gemma Payor, MD;  Location: [facility];  Service: Ophthalmology;  Laterality: Right;  CDE:5.31  . CATARACT EXTRACTION W/PHACO Left 01/19/2020   Procedure: CATARACT EXTRACTION PHACO AND INTRAOCULAR LENS PLACEMENT (IOC);  Surgeon: Fabio Pierce, MD;  Location: [facility];  Service: Ophthalmology;  Laterality: Left;  CDE: 12.32  . HAND SURGERY Bilateral   . NASAL SEPTOPLASTY W/ TURBINOPLASTY Bilateral 01/26/2016   Procedure: NASAL SEPTOPLASTY WITH BILATERAL TURBINATE REDUCTION;  Surgeon: Newman Pies, MD;  Location: [facility];  Service: ENT;  Laterality: Bilateral;  . NASAL SEPTUM SURGERY  01/26/2016  . NASAL SINUS SURGERY Bilateral 01/26/2016   Procedure: BILATERAL ENDOSCOPIC MAXILLARY ANTROSTOMY;  Surgeon: Newman Pies, MD;  Location: [facility];  Service: ENT;  Laterality: Bilateral;  . SHOULDER ARTHROSCOPY WITH ROTATOR CUFF REPAIR Right   . TUBAL LIGATION     Social History    Socioeconomic History  . Marital status: Widowed    Spouse name: Not on file  . Number of children: 1  . Years of education: 17  . Highest education level: Not on file  Occupational History  . Occupation: Unemployed  Tobacco Use  . Smoking status: Current Every Day Smoker    Packs/day: 0.10    Years: 44.00    Pack years: 4.40    Types: Cigarettes  . Smokeless tobacco: Never Used  Vaping Use  . Vaping Use: Never used  Substance and Sexual Activity  . Alcohol use: No    Alcohol/week: 0.0 standard drinks  . Drug use: No  . Sexual activity: Not on file  Other Topics Concern  . Not on file  Social History Narrative   Lives with boyfriend   Caffeine use: Drinks 1 soda a day    Social Determinants of Health   Financial Resource Strain: Not on file  Food Insecurity: Not on file  Transportation Needs: Not on file  Physical Activity: Not on file  Stress: Not on file  Social Connections: Not on file   Outpatient Encounter Medications as of 09/01/2020  Medication Sig  . glipiZIDE (GLUCOTROL XL) 5 MG 24 hr tablet Take 1 tablet (5 mg total) by mouth daily with breakfast.  . ACCU-CHEK AVIVA PLUS test strip USE TWICE DAILY TO CHECK SUGAR  . acetaminophen (TYLENOL) 500 MG tablet Take 1,500 mg by  mouth 2 (two) times daily as needed for moderate pain or headache.  . albuterol (PROVENTIL) (2.5 MG/3ML) 0.083% nebulizer solution Take 3 mLs (2.5 mg total) by nebulization every 6 (six) hours as needed for wheezing.  Marland Kitchen aspirin EC 81 MG tablet Take 81 mg by mouth daily.  Marland Kitchen atorvastatin (LIPITOR) 20 MG tablet TAKE ONE TABLET BY MOUTH DAILY (Patient taking differently: Take 20 mg by mouth daily. )  . Blood Glucose Monitoring Suppl (ACCU-CHEK AVIVA) device Use as instructed  . buPROPion (WELLBUTRIN SR) 150 MG 12 hr tablet Take 150 mg by mouth 2 (two) times daily.  . cetirizine (ZYRTEC) 10 MG tablet Take 10 mg by mouth daily.  . citalopram (CELEXA) 20 MG tablet Take 20 mg by mouth daily.  .  clonazePAM (KLONOPIN) 0.5 MG tablet Take 0.5 mg by mouth every 8 (eight) hours as needed for anxiety.   . fluticasone (FLONASE) 50 MCG/ACT nasal spray Place 1 spray into both nostrils daily.  Marland Kitchen gabapentin (NEURONTIN) 300 MG capsule Take 300 mg by mouth 2 (two) times daily.  Marland Kitchen glucose blood (ACCU-CHEK AVIVA) test strip To test 4 times a day  . hydrochlorothiazide (HYDRODIURIL) 25 MG tablet Take 25 mg by mouth daily.  . metFORMIN (GLUCOPHAGE) 850 MG tablet Take 850 mg by mouth 2 (two) times daily with a meal.  . montelukast (SINGULAIR) 10 MG tablet Take 10 mg by mouth at bedtime.  Marland Kitchen omeprazole (PRILOSEC) 20 MG capsule Take 20 mg by mouth daily.  Marland Kitchen PROAIR HFA 108 (90 Base) MCG/ACT inhaler Inhale 2 puffs into the lungs every 4 (four) hours as needed for wheezing or shortness of breath.   Marland Kitchen rOPINIRole (REQUIP) 0.25 MG tablet Take 0.25 mg by mouth See admin instructions. Take 0.25 mg at night may take a second 0.25 mg dose as needed for restless legs  . Tiotropium Bromide Monohydrate (SPIRIVA RESPIMAT) 2.5 MCG/ACT AERS Inhale 2 puffs into the lungs daily.  Nathen May SOLOSTAR 300 UNIT/ML Solostar Pen Inject 80 Units into the skin at bedtime.  Marland Kitchen ULTICARE MINI PEN NEEDLES 31G X 6 MM MISC USE AS DIRECTED ONCE DAILY.  . valsartan (DIOVAN) 160 MG tablet Take 160 mg by mouth daily.   No facility-administered encounter medications on file as of 09/01/2020.   ALLERGIES: Allergies  Allergen Reactions  . Lisinopril Cough   VACCINATION STATUS: Immunization History  Administered Date(s) Administered  . Influenza,inj,Quad PF,6+ Mos 03/26/2018    Diabetes She presents for her follow-up diabetic visit. She has type 2 diabetes mellitus. Onset time: She was diagnosed at approximate age of 44 years. Her disease course has been worsening. There are no hypoglycemic associated symptoms. Pertinent negatives for hypoglycemia include no confusion, headaches, pallor or seizures. Associated symptoms include polydipsia and  polyuria. Pertinent negatives for diabetes include no blurred vision, no chest pain, no fatigue and no polyphagia. There are no hypoglycemic complications. Symptoms are worsening. Diabetic complications include a CVA. (She is a chronic heavy smoker.) Risk factors for coronary artery disease include diabetes mellitus, dyslipidemia, hypertension, obesity, sedentary lifestyle and tobacco exposure. Current diabetic treatment includes oral agent (monotherapy). Her weight is fluctuating minimally. She is following a generally unhealthy diet. When asked about meal planning, she reported none. She rarely participates in exercise. Her home blood glucose trend is fluctuating minimally. Her breakfast blood glucose range is generally 140-180 mg/dl. Her bedtime blood glucose range is generally 180-200 mg/dl. Her overall blood glucose range is 140-180 mg/dl. (She presents with slightly above target glycemic profile  both fasting and postprandial.  Her previsit labs show A1c of 7.9%, progressively increasing from 7%.  She did not document any hypoglycemia.   ) An ACE inhibitor/angiotensin II receptor blocker is being taken. Eye exam is current.  Hyperlipidemia This is a chronic problem. The current episode started more than 1 year ago. The problem is controlled. Recent lipid tests were reviewed and are high. Exacerbating diseases include diabetes and obesity. Associated symptoms include shortness of breath. Pertinent negatives include no chest pain or myalgias. Current antihyperlipidemic treatment includes statins. Risk factors for coronary artery disease include diabetes mellitus, dyslipidemia, hypertension, a sedentary lifestyle, obesity and post-menopausal.  Hypertension This is a chronic problem. The current episode started more than 1 year ago. The problem is controlled. Associated symptoms include shortness of breath. Pertinent negatives include no blurred vision, chest pain, headaches or palpitations. Risk factors for  coronary artery disease include diabetes mellitus, dyslipidemia, sedentary lifestyle, smoking/tobacco exposure, post-menopausal state, family history and obesity. Past treatments include ACE inhibitors. Hypertensive end-organ damage includes CVA.    Review of systems  Limited as above.   Objective:    BP 139/69   Pulse 85   Ht 5\' 1"  (1.549 m)   Wt 218 lb 3.2 oz (99 kg)   BMI 41.23 kg/m   Wt Readings from Last 3 Encounters:  09/01/20 218 lb 3.2 oz (99 kg)  05/04/20 217 lb 9.6 oz (98.7 kg)  02/18/20 216 lb 3.2 oz (98.1 kg)     Physical Exam- Limited  Constitutional:  Body mass index is 41.23 kg/m. , not in acute distress, normal state of mind     CMP     Component Value Date/Time   NA 141 08/24/2020 0000   K 5.5 (A) 08/24/2020 0000   CL 95 (A) 08/24/2020 0000   CO2 28 (A) 08/24/2020 0000   GLUCOSE 160 (H) 02/11/2020 0727   BUN 24 (A) 08/24/2020 0000   CREATININE 1.0 08/24/2020 0000   CREATININE 0.89 02/11/2020 0727   CALCIUM 10.0 08/24/2020 0000   PROT 6.4 02/11/2020 0727   PROT 6.6 11/24/2015 1624   ALBUMIN 4.2 08/24/2020 0000   ALBUMIN 4.2 11/24/2015 1624   AST 19 08/24/2020 0000   ALT 22 08/24/2020 0000   ALKPHOS 77 08/24/2020 0000   BILITOT 0.3 02/11/2020 0727   BILITOT 0.4 11/24/2015 1624   GFRNONAA 68 02/11/2020 0727   GFRAA 79 02/11/2020 0727     Diabetic Labs (most recent): Lab Results  Component Value Date   HGBA1C 7.9 08/24/2020   HGBA1C 7.2 05/04/2020   HGBA1C 7.2 (H) 01/16/2020   Lipid Panel     Component Value Date/Time   CHOL 126 02/11/2020 0727   TRIG 122 02/11/2020 0727   HDL 38 (L) 02/11/2020 0727   CHOLHDL 3.3 02/11/2020 0727   LDLCALC 68 02/11/2020 0727      Assessment & Plan:   1. Uncontrolled type 2 diabetes mellitus with other circulatory complication, without long-term current use of insulin (HCC)   - Patient has currently improving  type 2 DM since  65 years of age.  She presents with slightly above target glycemic  profile both fasting and postprandial.  Her previsit labs show A1c of 7.9%, progressively increasing from 7%.  She did not document any hypoglycemia.    - Recent labs reviewed.   Her diabetes is complicated by TIA, chronic heavy smoking and patient remains at a high risk for more acute and chronic complications of diabetes which include CAD, CVA,  CKD, retinopathy, and neuropathy. These are all discussed in detail with the patient.  - I have counseled the patient on diet management and weight loss, by adopting a carbohydrate restricted/protein rich diet.  - she acknowledges that there is a room for improvement in her food and drink choices. - Suggestion is made for her to avoid simple carbohydrates  from her diet including Cakes, Sweet Desserts, Ice Cream, Soda (diet and regular), Sweet Tea, Candies, Chips, Cookies, Store Bought Juices, Alcohol in Excess of  1-2 drinks a day, Artificial Sweeteners,  Coffee Creamer, and "Sugar-free" Products, Lemonade. This will help patient to have more stable blood glucose profile and potentially avoid unintended weight gain.  - I encouraged the patient to switch to  unprocessed or minimally processed complex starch and increased protein intake (animal or plant source), fruits, and vegetables.  - Patient is advised to stick to a routine mealtimes to eat 3 meals  a day and avoid unnecessary snacks ( to snack only to correct hypoglycemia).   - I have approached patient with the following individualized plan to manage diabetes and patient agrees:    -Based on her presentation with above target glycemic profile she will need more treatment.  She would not tolerate any higher dose of basal insulin.   -She is advised to continue Toujeo 80 units nightly,  associated with monitoring of blood glucose before breakfast and at bedtime.  -Patient is encouraged to call clinic for blood glucose levels less than 70 or above 200 mg /dl.  -She is advised to continue metformin  850 mg by mouth twice a day, therapeutically suitable for patient. -I discussed and added glipizide 5 mg XL p.o. daily at breakfast.  - Patient specific target  A1c;  LDL, HDL, Triglycerides,  were discussed in detail.  2) BP/HTN:  -Her blood pressure is controlled to target.  She is advised to continue her current blood pressure medications including lisinopril 20 mg p.o. daily.   3) Lipids/HPL: Her recent lipid panel showed controlled LDL at 60, improving from 99, triglycerides improving to 120 improving from 213, she is advised to continue atorvastatin 20 mg p.o. nightly.   Side effects and precautions discussed with her.    4)  Weight/Diet: Her BMI is 41.2 -clearly complicating her diabetes care.  She is a candidate for modest weight loss.  She did not have any further success in weight loss, she still admits significant  dietary indiscretion, CDE Consult  Is in progress  , exercise, and detailed carbohydrates information provided.  5) Chronic Care/Health Maintenance:  -Patient is on ACEI and Statin medications and encouraged to continue to follow up with Ophthalmology, Podiatrist at least yearly or according to recommendations, and advised to  quit smoking. I have recommended yearly flu vaccine and pneumonia vaccination at least every 5 years; moderate intensity exercise for up to 150 minutes weekly; and  sleep for at least 7 hours a day.  The patient was counseled on the dangers of tobacco use, and was advised to quit.  Reviewed strategies to maximize success, including removing cigarettes and smoking materials from environment.  POCT ABI Results 09/01/20  Her screening ABI is normal today September 02, 2018 2012. Right ABI: 1.22      left ABI: 1.24  Right leg systolic / diastolic: 169/96 mmHg Left leg systolic / diastolic: 172/85 mmHg  Arm systolic / diastolic: 139/69 mmHG  This study will be repeated in March 2027, or sooner if needed.  The patient was counseled  on the dangers of  tobacco use, and was advised to quit.  Reviewed strategies to maximize success, including removing cigarettes and smoking materials from environment.   - I advised patient to maintain close follow up with Burdine, Ananias PilgrimSteven E, MD for primary care needs.  - Time spent on this patient care encounter:  45 min, of which > 50% was spent in  counseling and the rest reviewing her blood glucose logs , discussing her hypoglycemia and hyperglycemia episodes, reviewing her current and  previous labs / studies  ( including abstraction from other facilities) and medications  doses and developing a  long term treatment plan and documenting her care.   Please refer to Patient Instructions for Blood Glucose Monitoring and Insulin/Medications Dosing Guide"  in media tab for additional information. Please  also refer to " Patient Self Inventory" in the Media  tab for reviewed elements of pertinent patient history.  Lurlean HornsGeraldine J Keatley participated in the discussions, expressed understanding, and voiced agreement with the above plans.  All questions were answered to her satisfaction. she is encouraged to contact clinic should she have any questions or concerns prior to her return visit.     Follow up plan: - Return in about 4 months (around 01/01/2021) for Bring Meter and Logs- A1c in Office.  Marquis LunchGebre Kalleigh Harbor, MD Phone: [phone]  Fax: [phone]   -  This note was partially dictated with voice recognition software. Similar sounding words can be transcribed inadequately or may not  be corrected upon review.  09/01/2020, 11:18 AM

## 2020-09-07 DIAGNOSIS — J449 Chronic obstructive pulmonary disease, unspecified: Secondary | ICD-10-CM | POA: Diagnosis not present

## 2020-09-29 DIAGNOSIS — R922 Inconclusive mammogram: Secondary | ICD-10-CM | POA: Diagnosis not present

## 2020-09-29 DIAGNOSIS — R928 Other abnormal and inconclusive findings on diagnostic imaging of breast: Secondary | ICD-10-CM | POA: Diagnosis not present

## 2020-09-29 DIAGNOSIS — N6452 Nipple discharge: Secondary | ICD-10-CM | POA: Diagnosis not present

## 2020-10-04 ENCOUNTER — Other Ambulatory Visit: Payer: Self-pay | Admitting: "Endocrinology

## 2020-11-13 ENCOUNTER — Other Ambulatory Visit: Payer: Self-pay | Admitting: "Endocrinology

## 2020-11-17 DIAGNOSIS — E875 Hyperkalemia: Secondary | ICD-10-CM | POA: Diagnosis not present

## 2020-11-17 DIAGNOSIS — E1169 Type 2 diabetes mellitus with other specified complication: Secondary | ICD-10-CM | POA: Diagnosis not present

## 2020-11-17 DIAGNOSIS — Z1329 Encounter for screening for other suspected endocrine disorder: Secondary | ICD-10-CM | POA: Diagnosis not present

## 2020-11-17 DIAGNOSIS — E559 Vitamin D deficiency, unspecified: Secondary | ICD-10-CM | POA: Diagnosis not present

## 2020-11-17 DIAGNOSIS — E7800 Pure hypercholesterolemia, unspecified: Secondary | ICD-10-CM | POA: Diagnosis not present

## 2020-11-24 DIAGNOSIS — L02211 Cutaneous abscess of abdominal wall: Secondary | ICD-10-CM | POA: Diagnosis not present

## 2020-11-25 DIAGNOSIS — E559 Vitamin D deficiency, unspecified: Secondary | ICD-10-CM | POA: Diagnosis not present

## 2020-11-25 DIAGNOSIS — J439 Emphysema, unspecified: Secondary | ICD-10-CM | POA: Diagnosis not present

## 2020-11-25 DIAGNOSIS — I700 Atherosclerosis of aorta: Secondary | ICD-10-CM | POA: Diagnosis not present

## 2020-11-25 DIAGNOSIS — E875 Hyperkalemia: Secondary | ICD-10-CM | POA: Diagnosis not present

## 2020-11-25 DIAGNOSIS — Z720 Tobacco use: Secondary | ICD-10-CM | POA: Diagnosis not present

## 2020-11-25 DIAGNOSIS — E1165 Type 2 diabetes mellitus with hyperglycemia: Secondary | ICD-10-CM | POA: Diagnosis not present

## 2020-11-25 DIAGNOSIS — J449 Chronic obstructive pulmonary disease, unspecified: Secondary | ICD-10-CM | POA: Diagnosis not present

## 2020-11-25 DIAGNOSIS — I10 Essential (primary) hypertension: Secondary | ICD-10-CM | POA: Diagnosis not present

## 2020-12-03 ENCOUNTER — Other Ambulatory Visit: Payer: Self-pay | Admitting: "Endocrinology

## 2020-12-10 DIAGNOSIS — Z87891 Personal history of nicotine dependence: Secondary | ICD-10-CM | POA: Diagnosis not present

## 2020-12-10 DIAGNOSIS — Z122 Encounter for screening for malignant neoplasm of respiratory organs: Secondary | ICD-10-CM | POA: Diagnosis not present

## 2020-12-20 DIAGNOSIS — L03311 Cellulitis of abdominal wall: Secondary | ICD-10-CM | POA: Diagnosis not present

## 2020-12-29 DIAGNOSIS — K259 Gastric ulcer, unspecified as acute or chronic, without hemorrhage or perforation: Secondary | ICD-10-CM | POA: Diagnosis not present

## 2020-12-29 DIAGNOSIS — S31109A Unspecified open wound of abdominal wall, unspecified quadrant without penetration into peritoneal cavity, initial encounter: Secondary | ICD-10-CM | POA: Diagnosis not present

## 2021-01-19 DIAGNOSIS — L98491 Non-pressure chronic ulcer of skin of other sites limited to breakdown of skin: Secondary | ICD-10-CM | POA: Diagnosis not present

## 2021-01-19 DIAGNOSIS — Z7982 Long term (current) use of aspirin: Secondary | ICD-10-CM | POA: Diagnosis not present

## 2021-01-19 DIAGNOSIS — E785 Hyperlipidemia, unspecified: Secondary | ICD-10-CM | POA: Diagnosis not present

## 2021-01-19 DIAGNOSIS — F32A Depression, unspecified: Secondary | ICD-10-CM | POA: Diagnosis not present

## 2021-01-19 DIAGNOSIS — L98499 Non-pressure chronic ulcer of skin of other sites with unspecified severity: Secondary | ICD-10-CM | POA: Diagnosis not present

## 2021-01-19 DIAGNOSIS — Z87891 Personal history of nicotine dependence: Secondary | ICD-10-CM | POA: Diagnosis not present

## 2021-01-19 DIAGNOSIS — I10 Essential (primary) hypertension: Secondary | ICD-10-CM | POA: Diagnosis not present

## 2021-01-19 DIAGNOSIS — S31109A Unspecified open wound of abdominal wall, unspecified quadrant without penetration into peritoneal cavity, initial encounter: Secondary | ICD-10-CM | POA: Diagnosis not present

## 2021-01-19 DIAGNOSIS — E1140 Type 2 diabetes mellitus with diabetic neuropathy, unspecified: Secondary | ICD-10-CM | POA: Diagnosis not present

## 2021-01-19 DIAGNOSIS — E11628 Type 2 diabetes mellitus with other skin complications: Secondary | ICD-10-CM | POA: Diagnosis not present

## 2021-01-19 DIAGNOSIS — Z7984 Long term (current) use of oral hypoglycemic drugs: Secondary | ICD-10-CM | POA: Diagnosis not present

## 2021-01-19 DIAGNOSIS — Z79899 Other long term (current) drug therapy: Secondary | ICD-10-CM | POA: Diagnosis not present

## 2021-01-19 DIAGNOSIS — J449 Chronic obstructive pulmonary disease, unspecified: Secondary | ICD-10-CM | POA: Diagnosis not present

## 2021-01-28 ENCOUNTER — Ambulatory Visit (INDEPENDENT_AMBULATORY_CARE_PROVIDER_SITE_OTHER): Payer: Medicare Other | Admitting: "Endocrinology

## 2021-01-28 ENCOUNTER — Encounter: Payer: Self-pay | Admitting: "Endocrinology

## 2021-01-28 ENCOUNTER — Other Ambulatory Visit: Payer: Self-pay

## 2021-01-28 VITALS — BP 103/66 | HR 77 | Ht 61.0 in | Wt 221.4 lb

## 2021-01-28 DIAGNOSIS — K259 Gastric ulcer, unspecified as acute or chronic, without hemorrhage or perforation: Secondary | ICD-10-CM | POA: Diagnosis not present

## 2021-01-28 DIAGNOSIS — E669 Obesity, unspecified: Secondary | ICD-10-CM | POA: Diagnosis not present

## 2021-01-28 DIAGNOSIS — E782 Mixed hyperlipidemia: Secondary | ICD-10-CM

## 2021-01-28 DIAGNOSIS — E1169 Type 2 diabetes mellitus with other specified complication: Secondary | ICD-10-CM

## 2021-01-28 DIAGNOSIS — F17200 Nicotine dependence, unspecified, uncomplicated: Secondary | ICD-10-CM

## 2021-01-28 DIAGNOSIS — I10 Essential (primary) hypertension: Secondary | ICD-10-CM | POA: Diagnosis not present

## 2021-01-28 LAB — POCT GLYCOSYLATED HEMOGLOBIN (HGB A1C): HbA1c, POC (controlled diabetic range): 6.8 % (ref 0.0–7.0)

## 2021-01-28 NOTE — Patient Instructions (Signed)

## 2021-01-28 NOTE — Progress Notes (Signed)
01/28/2021   Endocrinology follow-up note   Subjective:    Patient ID: Victoria Lara, female    DOB: 01/15/1956.  She is here in follow-up for the management of symptomatic type 2 diabetes, hyperlipidemia, hypertension.    PMD:   Juliette Alcide, MD  Past Medical History:  Diagnosis Date   Anemia    Anxiety    Arthritis    Bronchitis    Complication of anesthesia    had to come back to ER after shoulder surgery at [facility]; hard to wake up; low O2 sats   Depression    Diabetes mellitus without complication (HCC)    GERD (gastroesophageal reflux disease)    Hyperlipidemia    Hypertension    Neuropathy    Takes Gabapentin   Pneumonia 2016   Restless leg syndrome    Seasonal allergies    Shortness of breath dyspnea    Sleep apnea    Syncope    TIA (transient ischemic attack) 2006   Past Surgical History:  Procedure Laterality Date   BREAST LUMPECTOMY Bilateral    CATARACT EXTRACTION W/PHACO Right 08/20/2014   Procedure: CATARACT EXTRACTION PHACO AND INTRAOCULAR LENS PLACEMENT (IOC);  Surgeon: Gemma Payor, MD;  Location: [facility];  Service: Ophthalmology;  Laterality: Right;  CDE:5.31   CATARACT EXTRACTION W/PHACO Left 01/19/2020   Procedure: CATARACT EXTRACTION PHACO AND INTRAOCULAR LENS PLACEMENT (IOC);  Surgeon: Fabio Pierce, MD;  Location: [facility];  Service: Ophthalmology;  Laterality: Left;  CDE: 12.32   HAND SURGERY Bilateral    NASAL SEPTOPLASTY W/ TURBINOPLASTY Bilateral 01/26/2016   Procedure: NASAL SEPTOPLASTY WITH BILATERAL TURBINATE REDUCTION;  Surgeon: Newman Pies, MD;  Location: [facility];  Service: ENT;  Laterality: Bilateral;   NASAL SEPTUM SURGERY  01/26/2016   NASAL SINUS SURGERY Bilateral 01/26/2016   Procedure: BILATERAL ENDOSCOPIC MAXILLARY ANTROSTOMY;  Surgeon: Newman Pies, MD;  Location: [facility];  Service: ENT;  Laterality: Bilateral;   SHOULDER ARTHROSCOPY WITH ROTATOR CUFF REPAIR Right    TUBAL LIGATION     Social History   Socioeconomic History   Marital  status: Widowed    Spouse name: Not on file   Number of children: 1   Years of education: 52   Highest education level: Not on file  Occupational History   Occupation: Unemployed  Tobacco Use   Smoking status: Every Day    Packs/day: 0.10    Years: 44.00    Pack years: 4.40    Types: Cigarettes   Smokeless tobacco: Never  Vaping Use   Vaping Use: Never used  Substance and Sexual Activity   Alcohol use: No    Alcohol/week: 0.0 standard drinks   Drug use: No   Sexual activity: Not on file  Other Topics Concern   Not on file  Social History Narrative   Lives with boyfriend   Caffeine use: Drinks 1 soda a day    Social Determinants of Health   Financial Resource Strain: Not on file  Food Insecurity: Not on file  Transportation Needs: Not on file  Physical Activity: Not on file  Stress: Not on file  Social Connections: Not on file   Outpatient Encounter Medications as of 01/28/2021  Medication Sig   ACCU-CHEK AVIVA PLUS test strip USE TWICE DAILY TO CHECK SUGAR   acetaminophen (TYLENOL) 500 MG tablet Take 1,500 mg by mouth 2 (two) times daily as needed for moderate pain or headache.   albuterol (PROVENTIL) (2.5 MG/3ML) 0.083% nebulizer solution Take 3 mLs (  2.5 mg total) by nebulization every 6 (six) hours as needed for wheezing.   aspirin EC 81 MG tablet Take 81 mg by mouth daily.   atorvastatin (LIPITOR) 20 MG tablet TAKE ONE TABLET BY MOUTH DAILY (Patient taking differently: Take 20 mg by mouth daily. )   Blood Glucose Monitoring Suppl (ACCU-CHEK AVIVA) device Use as instructed   buPROPion (WELLBUTRIN SR) 150 MG 12 hr tablet Take 150 mg by mouth 2 (two) times daily.   cetirizine (ZYRTEC) 10 MG tablet Take 10 mg by mouth daily.   citalopram (CELEXA) 20 MG tablet Take 20 mg by mouth daily.   clonazePAM (KLONOPIN) 0.5 MG tablet Take 0.5 mg by mouth every 8 (eight) hours as needed for anxiety.    EASY COMFORT PEN NEEDLES 31G X 6 MM MISC USE AS DIRECTED ONCE DAILY.    fluticasone (FLONASE) 50 MCG/ACT nasal spray Place 1 spray into both nostrils daily.   gabapentin (NEURONTIN) 300 MG capsule Take 300 mg by mouth 2 (two) times daily.   glipiZIDE (GLUCOTROL XL) 5 MG 24 hr tablet TAKE ONE TABLET BY MOUTH DAILY WITH BREAKFAST   glucose blood (ACCU-CHEK AVIVA) test strip To test 4 times a day   hydrochlorothiazide (HYDRODIURIL) 25 MG tablet Take 25 mg by mouth daily.   metFORMIN (GLUCOPHAGE) 850 MG tablet Take 850 mg by mouth 2 (two) times daily with a meal.   montelukast (SINGULAIR) 10 MG tablet Take 10 mg by mouth at bedtime.   omeprazole (PRILOSEC) 20 MG capsule Take 20 mg by mouth daily.   PROAIR HFA 108 (90 Base) MCG/ACT inhaler Inhale 2 puffs into the lungs every 4 (four) hours as needed for wheezing or shortness of breath.    rOPINIRole (REQUIP) 0.25 MG tablet Take 0.25 mg by mouth See admin instructions. Take 0.25 mg at night may take a second 0.25 mg dose as needed for restless legs   Tiotropium Bromide Monohydrate (SPIRIVA RESPIMAT) 2.5 MCG/ACT AERS Inhale 2 puffs into the lungs daily.   TOUJEO SOLOSTAR 300 UNIT/ML Solostar Pen INJECT 80 UNITS SUBCUTANEOUSLY AT BEDTIME   valsartan (DIOVAN) 160 MG tablet Take 160 mg by mouth daily.   No facility-administered encounter medications on file as of 01/28/2021.   ALLERGIES: Allergies  Allergen Reactions   Lisinopril Cough   VACCINATION STATUS: Immunization History  Administered Date(s) Administered   Influenza,inj,Quad PF,6+ Mos 03/26/2018    Diabetes She presents for her follow-up diabetic visit. She has type 2 diabetes mellitus. Onset time: She was diagnosed at approximate age of 44 years. Her disease course has been worsening. There are no hypoglycemic associated symptoms. Pertinent negatives for hypoglycemia include no confusion, headaches, pallor or seizures. Pertinent negatives for diabetes include no blurred vision, no chest pain, no fatigue, no polydipsia, no polyphagia and no polyuria. There are no  hypoglycemic complications. Symptoms are worsening. Diabetic complications include a CVA. (She is a chronic heavy smoker.) Risk factors for coronary artery disease include diabetes mellitus, dyslipidemia, hypertension, obesity, sedentary lifestyle and tobacco exposure. Current diabetic treatment includes oral agent (monotherapy). Her weight is increasing steadily. She is following a generally unhealthy diet. When asked about meal planning, she reported none. She rarely participates in exercise. Her home blood glucose trend is decreasing steadily. Her breakfast blood glucose range is generally 110-130 mg/dl. Her bedtime blood glucose range is generally 130-140 mg/dl. Her overall blood glucose range is 130-140 mg/dl. (She presents with significantly improved glycemic profile averaging 111-133 over the last 30 days.  Her point-of-care A1c  6.8% improving from 7.9% during her last visit.  She did not document or report any hypoglycemia.  ) An ACE inhibitor/angiotensin II receptor blocker is being taken. Eye exam is current.  Hyperlipidemia This is a chronic problem. The current episode started more than 1 year ago. The problem is controlled. Recent lipid tests were reviewed and are high. Exacerbating diseases include diabetes and obesity. Associated symptoms include shortness of breath. Pertinent negatives include no chest pain or myalgias. Current antihyperlipidemic treatment includes statins. Risk factors for coronary artery disease include diabetes mellitus, dyslipidemia, hypertension, a sedentary lifestyle, obesity and post-menopausal.  Hypertension This is a chronic problem. The current episode started more than 1 year ago. The problem is controlled. Associated symptoms include shortness of breath. Pertinent negatives include no blurred vision, chest pain, headaches or palpitations. Risk factors for coronary artery disease include diabetes mellitus, dyslipidemia, sedentary lifestyle, smoking/tobacco exposure,  post-menopausal state, family history and obesity. Past treatments include ACE inhibitors. Hypertensive end-organ damage includes CVA.   Review of systems  Limited as above.   Objective:    BP 103/66   Pulse 77   Ht 5\' 1"  (1.549 m)   Wt 221 lb 6.4 oz (100.4 kg)   BMI 41.83 kg/m   Wt Readings from Last 3 Encounters:  01/28/21 221 lb 6.4 oz (100.4 kg)  09/01/20 218 lb 3.2 oz (99 kg)  05/04/20 217 lb 9.6 oz (98.7 kg)     Physical Exam- Limited  Constitutional:  Body mass index is 41.83 kg/m. , not in acute distress, normal state of mind     CMP     Component Value Date/Time   NA 141 08/24/2020 0000   K 5.5 (A) 08/24/2020 0000   CL 95 (A) 08/24/2020 0000   CO2 28 (A) 08/24/2020 0000   GLUCOSE 160 (H) 02/11/2020 0727   BUN 24 (A) 08/24/2020 0000   CREATININE 1.0 08/24/2020 0000   CREATININE 0.89 02/11/2020 0727   CALCIUM 10.0 08/24/2020 0000   PROT 6.4 02/11/2020 0727   PROT 6.6 11/24/2015 1624   ALBUMIN 4.2 08/24/2020 0000   ALBUMIN 4.2 11/24/2015 1624   AST 19 08/24/2020 0000   ALT 22 08/24/2020 0000   ALKPHOS 77 08/24/2020 0000   BILITOT 0.3 02/11/2020 0727   BILITOT 0.4 11/24/2015 1624   GFRNONAA 68 02/11/2020 0727   GFRAA 79 02/11/2020 0727     Diabetic Labs (most recent): Lab Results  Component Value Date   HGBA1C 6.8 01/28/2021   HGBA1C 7.9 08/24/2020   HGBA1C 7.2 05/04/2020   Lipid Panel     Component Value Date/Time   CHOL 126 02/11/2020 0727   TRIG 122 02/11/2020 0727   HDL 38 (L) 02/11/2020 0727   CHOLHDL 3.3 02/11/2020 0727   LDLCALC 68 02/11/2020 0727      Assessment & Plan:   1. Uncontrolled type 2 diabetes mellitus with other circulatory complication, without long-term current use of insulin (HCC)   - Patient has currently improving  type 2 DM since  65 years of age.  She presents with significantly improved glycemic profile averaging 111-133 over the last 30 days.  Her point-of-care A1c 6.8% improving from 7.9% during her last  visit.  She did not document or report any hypoglycemia.    - Recent labs reviewed.   Her diabetes is complicated by TIA, chronic heavy smoking and patient remains at a high risk for more acute and chronic complications of diabetes which include CAD, CVA, CKD, retinopathy, and neuropathy. These  are all discussed in detail with the patient.  - I have counseled the patient on diet management and weight loss, by adopting a carbohydrate restricted/protein rich diet.  - she acknowledges that there is a room for improvement in her food and drink choices. - Suggestion is made for her to avoid simple carbohydrates  from her diet including Cakes, Sweet Desserts, Ice Cream, Soda (diet and regular), Sweet Tea, Candies, Chips, Cookies, Store Bought Juices, Alcohol in Excess of  1-2 drinks a day, Artificial Sweeteners,  Coffee Creamer, and "Sugar-free" Products, Lemonade. This will help patient to have more stable blood glucose profile and potentially avoid unintended weight gain.   - I encouraged the patient to switch to  unprocessed or minimally processed complex starch and increased protein intake (animal or plant source), fruits, and vegetables.  - Patient is advised to stick to a routine mealtimes to eat 3 meals  a day and avoid unnecessary snacks ( to snack only to correct hypoglycemia).   - I have approached patient with the following individualized plan to manage diabetes and patient agrees:    -Based on her presentation with above target glycemic profile, she will need continued treatment with multiple medications.  She will not require prandial insulin for now.    -She is advised to lower her Toujeo to 60 units nightly,associated with monitoring of blood glucose before breakfast and at bedtime.  -Patient is encouraged to call clinic for blood glucose levels less than 70 or above 200 mg /dl.  -She is advised to continue metformin 850 mg by mouth twice a day, therapeutically suitable for  patient. -She has benefited from low-dose glipizide, advised to continue  glipizide 5 mg XL p.o. daily at breakfast.  - Patient specific target  A1c;  LDL, HDL, Triglycerides,  were discussed in detail.  2) BP/HTN:  -Her blood pressure is controlled to target.  She is advised to continue her current blood pressure medications including lisinopril 20 mg p.o. daily.    3) Lipids/HPL: Her recent lipid panel showed controlled LDL at 60, improving from 99, triglycerides improving to 120 improving from 213, she is advised to continue atorvastatin 20 mg p.o. nightly.    Side effects and precautions discussed with her.    4)  Weight/Diet: Her BMI is 41.2 -clearly complicating her diabetes care.  She is a candidate for modest weight loss.  She did not have any further success in weight loss, she still admits significant  dietary indiscretion, CDE Consult  Is in progress  , exercise, and detailed carbohydrates information provided.  5) Chronic Care/Health Maintenance:  -Patient is on ACEI and Statin medications and encouraged to continue to follow up with Ophthalmology, Podiatrist at least yearly or according to recommendations, and advised to  quit smoking. I have recommended yearly flu vaccine and pneumonia vaccination at least every 5 years; moderate intensity exercise for up to 150 minutes weekly; and  sleep for at least 7 hours a day.  The patient was counseled on the dangers of tobacco use, and was advised to quit.  Reviewed strategies to maximize success, including removing cigarettes and smoking materials from environment.  She had normal ABI on September 01, 2020, this study will be repeated in March 2027 or sooner if needed.   - I advised patient to maintain close follow up with Burdine, Ananias Pilgrim, MD for primary care needs.    I spent 35 minutes in the care of the patient today including review of labs from CMP,  Lipids, Thyroid Function, Hematology (current and previous including abstractions  from other facilities); face-to-face time discussing  her blood glucose readings/logs, discussing hypoglycemia and hyperglycemia episodes and symptoms, medications doses, her options of short and long term treatment based on the latest standards of care / guidelines;  discussion about incorporating lifestyle medicine;  and documenting the encounter.    Please refer to Patient Instructions for Blood Glucose Monitoring and Insulin/Medications Dosing Guide"  in media tab for additional information. Please  also refer to " Patient Self Inventory" in the Media  tab for reviewed elements of pertinent patient history.  Lurlean Horns participated in the discussions, expressed understanding, and voiced agreement with the above plans.  All questions were answered to her satisfaction. she is encouraged to contact clinic should she have any questions or concerns prior to her return visit.   Follow up plan: - Return in about 3 months (around 04/30/2021) for F/U with Pre-visit Labs, Meter, Logs, A1c here., Urine MA - [place].  Marquis Lunch, MD Phone: [phone]  Fax: [phone]   -  This note was partially dictated with voice recognition software. Similar sounding words can be transcribed inadequately or may not  be corrected upon review.  01/28/2021, 8:25 AM

## 2021-02-01 ENCOUNTER — Ambulatory Visit: Payer: Medicare Other | Admitting: "Endocrinology

## 2021-02-16 DIAGNOSIS — E875 Hyperkalemia: Secondary | ICD-10-CM | POA: Diagnosis not present

## 2021-02-16 DIAGNOSIS — E1169 Type 2 diabetes mellitus with other specified complication: Secondary | ICD-10-CM | POA: Diagnosis not present

## 2021-02-16 DIAGNOSIS — I10 Essential (primary) hypertension: Secondary | ICD-10-CM | POA: Diagnosis not present

## 2021-02-17 DIAGNOSIS — E875 Hyperkalemia: Secondary | ICD-10-CM | POA: Diagnosis not present

## 2021-02-17 DIAGNOSIS — I10 Essential (primary) hypertension: Secondary | ICD-10-CM | POA: Diagnosis not present

## 2021-02-17 DIAGNOSIS — E1169 Type 2 diabetes mellitus with other specified complication: Secondary | ICD-10-CM | POA: Diagnosis not present

## 2021-02-23 DIAGNOSIS — J449 Chronic obstructive pulmonary disease, unspecified: Secondary | ICD-10-CM | POA: Diagnosis not present

## 2021-02-23 DIAGNOSIS — Z794 Long term (current) use of insulin: Secondary | ICD-10-CM | POA: Diagnosis not present

## 2021-02-23 DIAGNOSIS — F17210 Nicotine dependence, cigarettes, uncomplicated: Secondary | ICD-10-CM | POA: Diagnosis not present

## 2021-02-23 DIAGNOSIS — G4700 Insomnia, unspecified: Secondary | ICD-10-CM | POA: Diagnosis not present

## 2021-02-23 DIAGNOSIS — Z7984 Long term (current) use of oral hypoglycemic drugs: Secondary | ICD-10-CM | POA: Diagnosis not present

## 2021-02-23 DIAGNOSIS — S31109A Unspecified open wound of abdominal wall, unspecified quadrant without penetration into peritoneal cavity, initial encounter: Secondary | ICD-10-CM | POA: Diagnosis not present

## 2021-02-23 DIAGNOSIS — G4733 Obstructive sleep apnea (adult) (pediatric): Secondary | ICD-10-CM | POA: Diagnosis not present

## 2021-02-23 DIAGNOSIS — I10 Essential (primary) hypertension: Secondary | ICD-10-CM | POA: Diagnosis not present

## 2021-02-23 DIAGNOSIS — Z7982 Long term (current) use of aspirin: Secondary | ICD-10-CM | POA: Diagnosis not present

## 2021-02-23 DIAGNOSIS — E785 Hyperlipidemia, unspecified: Secondary | ICD-10-CM | POA: Diagnosis not present

## 2021-02-23 DIAGNOSIS — F17200 Nicotine dependence, unspecified, uncomplicated: Secondary | ICD-10-CM | POA: Diagnosis not present

## 2021-02-23 DIAGNOSIS — E1165 Type 2 diabetes mellitus with hyperglycemia: Secondary | ICD-10-CM | POA: Diagnosis not present

## 2021-02-23 DIAGNOSIS — J439 Emphysema, unspecified: Secondary | ICD-10-CM | POA: Diagnosis not present

## 2021-02-23 DIAGNOSIS — E1140 Type 2 diabetes mellitus with diabetic neuropathy, unspecified: Secondary | ICD-10-CM | POA: Diagnosis not present

## 2021-02-23 DIAGNOSIS — Z79899 Other long term (current) drug therapy: Secondary | ICD-10-CM | POA: Diagnosis not present

## 2021-02-23 DIAGNOSIS — G2581 Restless legs syndrome: Secondary | ICD-10-CM | POA: Diagnosis not present

## 2021-02-23 DIAGNOSIS — I700 Atherosclerosis of aorta: Secondary | ICD-10-CM | POA: Diagnosis not present

## 2021-02-26 ENCOUNTER — Other Ambulatory Visit: Payer: Self-pay | Admitting: "Endocrinology

## 2021-03-23 DIAGNOSIS — H109 Unspecified conjunctivitis: Secondary | ICD-10-CM | POA: Diagnosis not present

## 2021-03-23 DIAGNOSIS — J069 Acute upper respiratory infection, unspecified: Secondary | ICD-10-CM | POA: Diagnosis not present

## 2021-04-06 DIAGNOSIS — Z23 Encounter for immunization: Secondary | ICD-10-CM | POA: Diagnosis not present

## 2021-05-02 ENCOUNTER — Ambulatory Visit: Payer: Medicare Other | Admitting: "Endocrinology

## 2021-05-02 DIAGNOSIS — J209 Acute bronchitis, unspecified: Secondary | ICD-10-CM | POA: Diagnosis not present

## 2021-05-02 DIAGNOSIS — J449 Chronic obstructive pulmonary disease, unspecified: Secondary | ICD-10-CM | POA: Diagnosis not present

## 2021-05-09 DIAGNOSIS — E1169 Type 2 diabetes mellitus with other specified complication: Secondary | ICD-10-CM | POA: Diagnosis not present

## 2021-05-10 ENCOUNTER — Other Ambulatory Visit: Payer: Self-pay | Admitting: "Endocrinology

## 2021-05-10 LAB — COMPREHENSIVE METABOLIC PANEL
ALT: 24 IU/L (ref 0–32)
AST: 25 IU/L (ref 0–40)
Albumin/Globulin Ratio: 1.6 (ref 1.2–2.2)
Albumin: 4 g/dL (ref 3.8–4.8)
Alkaline Phosphatase: 86 IU/L (ref 44–121)
BUN/Creatinine Ratio: 32 — ABNORMAL HIGH (ref 12–28)
BUN: 29 mg/dL — ABNORMAL HIGH (ref 8–27)
Bilirubin Total: 0.4 mg/dL (ref 0.0–1.2)
CO2: 29 mmol/L (ref 20–29)
Calcium: 9.8 mg/dL (ref 8.7–10.3)
Chloride: 96 mmol/L (ref 96–106)
Creatinine, Ser: 0.92 mg/dL (ref 0.57–1.00)
Globulin, Total: 2.5 g/dL (ref 1.5–4.5)
Glucose: 119 mg/dL — ABNORMAL HIGH (ref 70–99)
Potassium: 4.9 mmol/L (ref 3.5–5.2)
Sodium: 138 mmol/L (ref 134–144)
Total Protein: 6.5 g/dL (ref 6.0–8.5)
eGFR: 69 mL/min/{1.73_m2} (ref >59)

## 2021-05-10 LAB — T4, FREE: Free T4: 1.39 ng/dL (ref 0.82–1.77)

## 2021-05-10 LAB — LIPID PANEL
Chol/HDL Ratio: 3.4 ratio (ref 0.0–4.4)
Cholesterol, Total: 119 mg/dL (ref 100–199)
HDL: 35 mg/dL — ABNORMAL LOW (ref >39)
LDL Chol Calc (NIH): 54 mg/dL (ref 0–99)
Triglycerides: 179 mg/dL — ABNORMAL HIGH (ref 0–149)
VLDL Cholesterol Cal: 30 mg/dL (ref 5–40)

## 2021-05-10 LAB — TSH: TSH: 5.13 u[IU]/mL — ABNORMAL HIGH (ref 0.450–4.500)

## 2021-05-16 DIAGNOSIS — E875 Hyperkalemia: Secondary | ICD-10-CM | POA: Diagnosis not present

## 2021-05-16 DIAGNOSIS — E1169 Type 2 diabetes mellitus with other specified complication: Secondary | ICD-10-CM | POA: Diagnosis not present

## 2021-05-16 DIAGNOSIS — R5382 Chronic fatigue, unspecified: Secondary | ICD-10-CM | POA: Diagnosis not present

## 2021-05-16 DIAGNOSIS — Z1329 Encounter for screening for other suspected endocrine disorder: Secondary | ICD-10-CM | POA: Diagnosis not present

## 2021-05-16 DIAGNOSIS — Z13220 Encounter for screening for lipoid disorders: Secondary | ICD-10-CM | POA: Diagnosis not present

## 2021-05-18 DIAGNOSIS — J449 Chronic obstructive pulmonary disease, unspecified: Secondary | ICD-10-CM | POA: Diagnosis not present

## 2021-05-18 DIAGNOSIS — D751 Secondary polycythemia: Secondary | ICD-10-CM | POA: Diagnosis not present

## 2021-05-18 DIAGNOSIS — I10 Essential (primary) hypertension: Secondary | ICD-10-CM | POA: Diagnosis not present

## 2021-05-18 DIAGNOSIS — E1165 Type 2 diabetes mellitus with hyperglycemia: Secondary | ICD-10-CM | POA: Diagnosis not present

## 2021-05-18 DIAGNOSIS — I700 Atherosclerosis of aorta: Secondary | ICD-10-CM | POA: Diagnosis not present

## 2021-05-18 DIAGNOSIS — J439 Emphysema, unspecified: Secondary | ICD-10-CM | POA: Diagnosis not present

## 2021-05-18 DIAGNOSIS — R7989 Other specified abnormal findings of blood chemistry: Secondary | ICD-10-CM | POA: Diagnosis not present

## 2021-05-26 ENCOUNTER — Encounter: Payer: Self-pay | Admitting: "Endocrinology

## 2021-05-26 ENCOUNTER — Ambulatory Visit: Payer: Medicare Other | Admitting: "Endocrinology

## 2021-05-26 ENCOUNTER — Ambulatory Visit (INDEPENDENT_AMBULATORY_CARE_PROVIDER_SITE_OTHER): Payer: Medicare Other | Admitting: "Endocrinology

## 2021-05-26 VITALS — BP 122/66 | HR 76 | Ht 61.0 in | Wt 219.8 lb

## 2021-05-26 DIAGNOSIS — I10 Essential (primary) hypertension: Secondary | ICD-10-CM

## 2021-05-26 DIAGNOSIS — E782 Mixed hyperlipidemia: Secondary | ICD-10-CM

## 2021-05-26 DIAGNOSIS — E669 Obesity, unspecified: Secondary | ICD-10-CM | POA: Diagnosis not present

## 2021-05-26 DIAGNOSIS — E1169 Type 2 diabetes mellitus with other specified complication: Secondary | ICD-10-CM | POA: Diagnosis not present

## 2021-05-26 DIAGNOSIS — F17200 Nicotine dependence, unspecified, uncomplicated: Secondary | ICD-10-CM

## 2021-05-26 DIAGNOSIS — E038 Other specified hypothyroidism: Secondary | ICD-10-CM

## 2021-05-26 LAB — POCT GLYCOSYLATED HEMOGLOBIN (HGB A1C): HbA1c, POC (controlled diabetic range): 8.3 % — AB (ref 0.0–7.0)

## 2021-05-26 LAB — POCT UA - MICROALBUMIN
Albumin/Creatinine Ratio, Urine, POC: <30
Creatinine, POC: 300 mg/dL
Microalbumin Ur, POC: 30 mg/L

## 2021-05-26 MED ORDER — TOUJEO SOLOSTAR 300 UNIT/ML ~~LOC~~ SOPN: 70.0000 [IU] | PEN_INJECTOR | Freq: Every day | SUBCUTANEOUS | 1 refills | Status: DC

## 2021-05-26 NOTE — Patient Instructions (Signed)

## 2021-05-26 NOTE — Progress Notes (Signed)
05/26/2021   Endocrinology follow-up note   Subjective:    Patient ID: Victoria Lara, female    DOB: [birth year].  She is here in follow-up for the management of symptomatic type 2 diabetes, hyperlipidemia, hypertension.    PMD:   Juliette Alcide, MD  Past Medical History:  Diagnosis Date   Anemia    Anxiety    Arthritis    Bronchitis    Complication of anesthesia    had to come back to ER after shoulder surgery at [facility]; hard to wake up; low O2 sats   Depression    Diabetes mellitus without complication (HCC)    GERD (gastroesophageal reflux disease)    Hyperlipidemia    Hypertension    Neuropathy    Takes Gabapentin   Pneumonia 2016   Restless leg syndrome    Seasonal allergies    Shortness of breath dyspnea    Sleep apnea    Syncope    TIA (transient ischemic attack) 2006   Past Surgical History:  Procedure Laterality Date   BREAST LUMPECTOMY Bilateral    CATARACT EXTRACTION W/PHACO Right 08/20/2014   Procedure: CATARACT EXTRACTION PHACO AND INTRAOCULAR LENS PLACEMENT (IOC);  Surgeon: Gemma Payor, MD;  Location: [facility];  Service: Ophthalmology;  Laterality: Right;  CDE:5.31   CATARACT EXTRACTION W/PHACO Left 01/19/2020   Procedure: CATARACT EXTRACTION PHACO AND INTRAOCULAR LENS PLACEMENT (IOC);  Surgeon: Fabio Pierce, MD;  Location: [facility];  Service: Ophthalmology;  Laterality: Left;  CDE: 12.32   HAND SURGERY Bilateral    NASAL SEPTOPLASTY W/ TURBINOPLASTY Bilateral 01/26/2016   Procedure: NASAL SEPTOPLASTY WITH BILATERAL TURBINATE REDUCTION;  Surgeon: Newman Pies, MD;  Location: [facility];  Service: ENT;  Laterality: Bilateral;   NASAL SEPTUM SURGERY  01/26/2016   NASAL SINUS SURGERY Bilateral 01/26/2016   Procedure: BILATERAL ENDOSCOPIC MAXILLARY ANTROSTOMY;  Surgeon: Newman Pies, MD;  Location: [facility];  Service: ENT;  Laterality: Bilateral;   SHOULDER ARTHROSCOPY WITH ROTATOR CUFF REPAIR Right    TUBAL LIGATION     Social History   Socioeconomic History   Marital  status: Widowed    Spouse name: Not on file   Number of children: 1   Years of education: 63   Highest education level: Not on file  Occupational History   Occupation: Unemployed  Tobacco Use   Smoking status: Every Day    Packs/day: 0.10    Years: 44.00    Pack years: 4.40    Types: Cigarettes   Smokeless tobacco: Never  Vaping Use   Vaping Use: Never used  Substance and Sexual Activity   Alcohol use: No    Alcohol/week: 0.0 standard drinks   Drug use: No   Sexual activity: Not on file  Other Topics Concern   Not on file  Social History Narrative   Lives with boyfriend   Caffeine use: Drinks 1 soda a day    Social Determinants of Health   Financial Resource Strain: Not on file  Food Insecurity: Not on file  Transportation Needs: Not on file  Physical Activity: Not on file  Stress: Not on file  Social Connections: Not on file   Outpatient Encounter Medications as of 05/26/2021  Medication Sig   ACCU-CHEK AVIVA PLUS test strip USE TWICE DAILY TO CHECK SUGAR   acetaminophen (TYLENOL) 500 MG tablet Take 1,500 mg by mouth 2 (two) times daily as needed for moderate pain or headache.   albuterol (PROVENTIL) (2.5 MG/3ML) 0.083% nebulizer solution Take 3 mLs (  2.5 mg total) by nebulization every 6 (six) hours as needed for wheezing.   aspirin EC 81 MG tablet Take 81 mg by mouth daily.   atorvastatin (LIPITOR) 20 MG tablet TAKE ONE TABLET BY MOUTH DAILY (Patient taking differently: Take 20 mg by mouth daily. )   Blood Glucose Monitoring Suppl (ACCU-CHEK AVIVA) device Use as instructed   buPROPion (WELLBUTRIN SR) 150 MG 12 hr tablet Take 150 mg by mouth 2 (two) times daily.   cetirizine (ZYRTEC) 10 MG tablet Take 10 mg by mouth daily.   citalopram (CELEXA) 20 MG tablet Take 40 mg by mouth daily.   clonazePAM (KLONOPIN) 0.5 MG tablet Take 0.5 mg by mouth every 8 (eight) hours as needed for anxiety.    EASY COMFORT PEN NEEDLES 31G X 6 MM MISC USE AS DIRECTED ONCE DAILY.    fluticasone (FLONASE) 50 MCG/ACT nasal spray Place 1 spray into both nostrils daily.   gabapentin (NEURONTIN) 300 MG capsule Take 300 mg by mouth 2 (two) times daily.   glipiZIDE (GLUCOTROL XL) 5 MG 24 hr tablet TAKE ONE TABLET BY MOUTH DAILY WITH BREAKFAST   glucose blood (ACCU-CHEK AVIVA) test strip To test 4 times a day   hydrochlorothiazide (HYDRODIURIL) 25 MG tablet Take 25 mg by mouth daily.   insulin glargine, 1 Unit Dial, (TOUJEO SOLOSTAR) 300 UNIT/ML Solostar Pen Inject 70 Units into the skin at bedtime.   metFORMIN (GLUCOPHAGE) 850 MG tablet Take 850 mg by mouth 2 (two) times daily with a meal.   montelukast (SINGULAIR) 10 MG tablet Take 10 mg by mouth at bedtime.   omeprazole (PRILOSEC) 20 MG capsule Take 20 mg by mouth daily.   PROAIR HFA 108 (90 Base) MCG/ACT inhaler Inhale 2 puffs into the lungs every 4 (four) hours as needed for wheezing or shortness of breath.    rOPINIRole (REQUIP) 0.25 MG tablet Take 0.25 mg by mouth See admin instructions. Take 0.25 mg at night may take a second 0.25 mg dose as needed for restless legs   Tiotropium Bromide Monohydrate (SPIRIVA RESPIMAT) 2.5 MCG/ACT AERS Inhale 2 puffs into the lungs daily.   valsartan (DIOVAN) 160 MG tablet Take 160 mg by mouth daily.   [DISCONTINUED] insulin glargine, 1 Unit Dial, (TOUJEO SOLOSTAR) 300 UNIT/ML Solostar Pen Inject 60 Units into the skin at bedtime.   No facility-administered encounter medications on file as of 05/26/2021.   ALLERGIES: Allergies  Allergen Reactions   Lisinopril Cough   VACCINATION STATUS: Immunization History  Administered Date(s) Administered   Influenza,inj,Quad PF,6+ Mos 03/26/2018    Diabetes She presents for her follow-up diabetic visit. She has type 2 diabetes mellitus. Onset time: She was diagnosed at approximate age of 44 years. Her disease course has been worsening. There are no hypoglycemic associated symptoms. Pertinent negatives for hypoglycemia include no confusion,  headaches, pallor or seizures. Associated symptoms include polydipsia and polyuria. Pertinent negatives for diabetes include no blurred vision, no chest pain, no fatigue and no polyphagia. There are no hypoglycemic complications. Symptoms are worsening. Diabetic complications include a CVA. (She is a chronic heavy smoker.) Risk factors for coronary artery disease include diabetes mellitus, dyslipidemia, hypertension, obesity, sedentary lifestyle and tobacco exposure. Current diabetic treatment includes oral agent (monotherapy). Her weight is increasing steadily. She is following a generally unhealthy diet. When asked about meal planning, she reported none. She rarely participates in exercise. Her home blood glucose trend is decreasing steadily. Her breakfast blood glucose range is generally 110-130 mg/dl. Her bedtime blood glucose  range is generally 130-140 mg/dl. Her overall blood glucose range is 130-140 mg/dl. (She presents with loss of control of glycemia.  Her point-of-care A1c is 8.3% increasing from 6.8%.  She is significantly above target towards prandial as well as fasting glycemic profile.  She did not document or report any hypoglycemia.  ) An ACE inhibitor/angiotensin II receptor blocker is being taken. Eye exam is current.  Hyperlipidemia This is a chronic problem. The current episode started more than 1 year ago. The problem is controlled. Recent lipid tests were reviewed and are high. Exacerbating diseases include diabetes and obesity. Associated symptoms include shortness of breath. Pertinent negatives include no chest pain or myalgias. Current antihyperlipidemic treatment includes statins. Risk factors for coronary artery disease include diabetes mellitus, dyslipidemia, hypertension, a sedentary lifestyle, obesity and post-menopausal.  Hypertension This is a chronic problem. The current episode started more than 1 year ago. The problem is controlled. Associated symptoms include shortness of  breath. Pertinent negatives include no blurred vision, chest pain, headaches or palpitations. Risk factors for coronary artery disease include diabetes mellitus, dyslipidemia, sedentary lifestyle, smoking/tobacco exposure, post-menopausal state, family history and obesity. Past treatments include ACE inhibitors. Hypertensive end-organ damage includes CVA.   Review of systems  Limited as above.   Objective:    BP 122/66   Pulse 76   Ht 5\' 1"  (1.549 m)   Wt 219 lb 12.8 oz (99.7 kg)   BMI 41.53 kg/m   Wt Readings from Last 3 Encounters:  05/26/21 219 lb 12.8 oz (99.7 kg)  01/28/21 221 lb 6.4 oz (100.4 kg)  09/01/20 218 lb 3.2 oz (99 kg)     Physical Exam- Limited  Constitutional:  Body mass index is 41.53 kg/m. , not in acute distress, normal state of mind     CMP     Component Value Date/Time   NA 138 05/09/2021 0811   K 4.9 05/09/2021 0811   CL 96 05/09/2021 0811   CO2 29 05/09/2021 0811   GLUCOSE 119 (H) 05/09/2021 0811   GLUCOSE 160 (H) 02/11/2020 0727   BUN 29 (H) 05/09/2021 0811   CREATININE 0.92 05/09/2021 0811   CREATININE 0.89 02/11/2020 0727   CALCIUM 9.8 05/09/2021 0811   PROT 6.5 05/09/2021 0811   ALBUMIN 4.0 05/09/2021 0811   AST 25 05/09/2021 0811   ALT 24 05/09/2021 0811   ALKPHOS 86 05/09/2021 0811   BILITOT 0.4 05/09/2021 0811   GFRNONAA 68 02/11/2020 0727   GFRAA 79 02/11/2020 0727     Diabetic Labs (most recent): Lab Results  Component Value Date   HGBA1C 8.3 (A) 05/26/2021   HGBA1C 6.8 01/28/2021   HGBA1C 7.9 08/24/2020   Lipid Panel     Component Value Date/Time   CHOL 119 05/09/2021 0811   TRIG 179 (H) 05/09/2021 0811   HDL 35 (L) 05/09/2021 0811   CHOLHDL 3.4 05/09/2021 0811   CHOLHDL 3.3 02/11/2020 0727   LDLCALC 54 05/09/2021 0811   LDLCALC 68 02/11/2020 0727      Assessment & Plan:   1. Uncontrolled type 2 diabetes mellitus with other circulatory complication, without long-term current use of insulin (HCC)   -  Patient has currently improving  type 2 DM since  65 years of age.  Scheryl presents with loss of control of glycemia.  Her point-of-care A1c is 8.3% increasing from 6.8%.  She is significantly above target towards prandial as well as fasting glycemic profile.  She did not document or report any hypoglycemia.    -  Recent labs reviewed.   Her diabetes is complicated by TIA, chronic heavy smoking and patient remains at a high risk for more acute and chronic complications of diabetes which include CAD, CVA, CKD, retinopathy, and neuropathy. These are all discussed in detail with the patient.  - I have counseled the patient on diet management and weight loss, by adopting a carbohydrate restricted/protein rich diet.  - she acknowledges that there is a room for improvement in her food and drink choices. - Suggestion is made for her to avoid simple carbohydrates  from her diet including Cakes, Sweet Desserts, Ice Cream, Soda (diet and regular), Sweet Tea, Candies, Chips, Cookies, Store Bought Juices, Alcohol in Excess of  1-2 drinks a day, Artificial Sweeteners,  Coffee Creamer, and "Sugar-free" Products, Lemonade. This will help patient to have more stable blood glucose profile and potentially avoid unintended weight gain.  - I encouraged the patient to switch to  unprocessed or minimally processed complex starch and increased protein intake (animal or plant source), fruits, and vegetables.  - Patient is advised to stick to a routine mealtimes to eat 3 meals  a day and avoid unnecessary snacks ( to snack only to correct hypoglycemia).   - I have approached patient with the following individualized plan to manage diabetes and patient agrees:    -Based on her presentation with above target glycemic profile, she will tolerate higher dose of basal insulin.  I discussed and increase her Toujeo to 70 units nightly, associated with monitoring of blood glucose twice a day-daily before breakfast and at  bedtime.  She will not need prandial insulin at this time.   -Patient is encouraged to call clinic for blood glucose levels less than 70 or above 200 mg /dl.  -She is advised to continue metformin 850 mg by mouth twice a day, therapeutically suitable for patient. -She is advised to continue glipizide 5 mg XL p.o. daily at breakfast.   - Patient specific target  A1c;  LDL, HDL, Triglycerides,  were discussed in detail.  2) BP/HTN:  -Her blood pressure is controlled to target.  She is advised to continue her current blood pressure medications including lisinopril 20 mg p.o. daily.    3) Lipids/HPL: Her recent lipid panel showed controlled LDL at 60, improving from 99, triglycerides improving to 120 improving from 213, she is advised to continue atorvastatin 20 mg p.o. nightly.    Side effects and precautions discussed with her.    4)  Weight/Diet: Her BMI is 41.5- -clearly complicating her diabetes care.  She is a candidate for modest weight loss.  She did not have any further success in weight loss, she still admits significant  dietary indiscretion, CDE Consult  Is in progress  , exercise, and detailed carbohydrates information provided.  5) Chronic Care/Health Maintenance:  -Patient is on ACEI and Statin medications and encouraged to continue to follow up with Ophthalmology, Podiatrist at least yearly or according to recommendations, and advised to  quit smoking. I have recommended yearly flu vaccine and pneumonia vaccination at least every 5 years; moderate intensity exercise for up to 150 minutes weekly; and  sleep for at least 7 hours a day.  The patient was counseled on the dangers of tobacco use, and was advised to quit.  Reviewed strategies to maximize success, including removing cigarettes and smoking materials from environment.   She had normal ABI on September 01, 2020, this study will be repeated in March 2027 or sooner if needed.   -  I advised patient to maintain close follow up with  Burdine, Ananias Pilgrim, MD for primary care needs.   I spent 42 minutes in the care of the patient today including review of labs from CMP, Lipids, Thyroid Function, Hematology (current and previous including abstractions from other facilities); face-to-face time discussing  her blood glucose readings/logs, discussing hypoglycemia and hyperglycemia episodes and symptoms, medications doses, her options of short and long term treatment based on the latest standards of care / guidelines;  discussion about incorporating lifestyle medicine;  and documenting the encounter.    Please refer to Patient Instructions for Blood Glucose Monitoring and Insulin/Medications Dosing Guide"  in media tab for additional information. Please  also refer to " Patient Self Inventory" in the Media  tab for reviewed elements of pertinent patient history.  Lurlean Horns participated in the discussions, expressed understanding, and voiced agreement with the above plans.  All questions were answered to her satisfaction. she is encouraged to contact clinic should she have any questions or concerns prior to her return visit.   Follow up plan: - Return in about 3 months (around 08/24/2021) for Meter, Logs, A1c here., F/U with Pre-visit Labs, Meter, Logs, A1c here.Marquis Lunch, MD Phone: [phone]  Fax: [phone]   -  This note was partially dictated with voice recognition software. Similar sounding words can be transcribed inadequately or may not  be corrected upon review.  05/26/2021, 4:24 PM

## 2021-06-01 DIAGNOSIS — H524 Presbyopia: Secondary | ICD-10-CM | POA: Diagnosis not present

## 2021-06-01 DIAGNOSIS — Z794 Long term (current) use of insulin: Secondary | ICD-10-CM | POA: Diagnosis not present

## 2021-06-01 DIAGNOSIS — Z7984 Long term (current) use of oral hypoglycemic drugs: Secondary | ICD-10-CM | POA: Diagnosis not present

## 2021-06-01 DIAGNOSIS — Z961 Presence of intraocular lens: Secondary | ICD-10-CM | POA: Diagnosis not present

## 2021-06-01 DIAGNOSIS — E119 Type 2 diabetes mellitus without complications: Secondary | ICD-10-CM | POA: Diagnosis not present

## 2021-06-01 LAB — HM DIABETES EYE EXAM

## 2021-06-15 DIAGNOSIS — M19011 Primary osteoarthritis, right shoulder: Secondary | ICD-10-CM | POA: Diagnosis not present

## 2021-06-15 DIAGNOSIS — F17210 Nicotine dependence, cigarettes, uncomplicated: Secondary | ICD-10-CM | POA: Diagnosis not present

## 2021-06-15 DIAGNOSIS — M25511 Pain in right shoulder: Secondary | ICD-10-CM | POA: Diagnosis not present

## 2021-06-15 DIAGNOSIS — M7551 Bursitis of right shoulder: Secondary | ICD-10-CM | POA: Diagnosis not present

## 2021-08-19 DIAGNOSIS — E038 Other specified hypothyroidism: Secondary | ICD-10-CM | POA: Diagnosis not present

## 2021-08-21 LAB — THYROID PEROXIDASE ANTIBODY: Thyroperoxidase Ab SerPl-aCnc: 11 IU/mL (ref 0–34)

## 2021-08-21 LAB — TSH: TSH: 3.02 u[IU]/mL (ref 0.450–4.500)

## 2021-08-21 LAB — T3, FREE: T3, Free: 2.4 pg/mL (ref 2.0–4.4)

## 2021-08-21 LAB — T4, FREE: Free T4: 0.93 ng/dL (ref 0.82–1.77)

## 2021-08-21 LAB — THYROGLOBULIN ANTIBODY: Thyroglobulin Antibody: <1 IU/mL (ref 0.0–0.9)

## 2021-08-22 DIAGNOSIS — R7989 Other specified abnormal findings of blood chemistry: Secondary | ICD-10-CM | POA: Diagnosis not present

## 2021-08-22 DIAGNOSIS — E1165 Type 2 diabetes mellitus with hyperglycemia: Secondary | ICD-10-CM | POA: Diagnosis not present

## 2021-08-24 ENCOUNTER — Ambulatory Visit (INDEPENDENT_AMBULATORY_CARE_PROVIDER_SITE_OTHER): Payer: Medicare Other | Admitting: "Endocrinology

## 2021-08-24 ENCOUNTER — Encounter: Payer: Self-pay | Admitting: "Endocrinology

## 2021-08-24 VITALS — BP 112/78 | HR 76 | Ht 61.0 in | Wt 220.4 lb

## 2021-08-24 DIAGNOSIS — E038 Other specified hypothyroidism: Secondary | ICD-10-CM | POA: Diagnosis not present

## 2021-08-24 DIAGNOSIS — F17200 Nicotine dependence, unspecified, uncomplicated: Secondary | ICD-10-CM | POA: Diagnosis not present

## 2021-08-24 DIAGNOSIS — E782 Mixed hyperlipidemia: Secondary | ICD-10-CM | POA: Diagnosis not present

## 2021-08-24 DIAGNOSIS — I10 Essential (primary) hypertension: Secondary | ICD-10-CM

## 2021-08-24 DIAGNOSIS — E669 Obesity, unspecified: Secondary | ICD-10-CM

## 2021-08-24 DIAGNOSIS — E1169 Type 2 diabetes mellitus with other specified complication: Secondary | ICD-10-CM

## 2021-08-24 LAB — POCT GLYCOSYLATED HEMOGLOBIN (HGB A1C): HbA1c, POC (controlled diabetic range): 7.2 % — AB (ref 0.0–7.0)

## 2021-08-24 NOTE — Progress Notes (Signed)
08/24/2021   Endocrinology follow-up note   Subjective:    Patient ID: Victoria Lara, female    DOB: [birth year].  She is here in follow-up for the management of symptomatic type 2 diabetes, hyperlipidemia, hypertension.    PMD:   Curlene Labrum, MD  Past Medical History:  Diagnosis Date   Anemia    Anxiety    Arthritis    Bronchitis    Complication of anesthesia    had to come back to ER after shoulder surgery at [facility]; hard to wake up; low O2 sats   Depression    Diabetes mellitus without complication (HCC)    GERD (gastroesophageal reflux disease)    Hyperlipidemia    Hypertension    Neuropathy    Takes Gabapentin   Pneumonia 2016   Restless leg syndrome    Seasonal allergies    Shortness of breath dyspnea    Sleep apnea    Syncope    TIA (transient ischemic attack) 2006   Past Surgical History:  Procedure Laterality Date   BREAST LUMPECTOMY Bilateral    CATARACT EXTRACTION W/PHACO Right 08/20/2014   Procedure: CATARACT EXTRACTION PHACO AND INTRAOCULAR LENS PLACEMENT ([place]);  Surgeon: Tonny Branch, MD;  Location: [facility];  Service: Ophthalmology;  Laterality: Right;  CDE:5.31   CATARACT EXTRACTION W/PHACO Left 01/19/2020   Procedure: CATARACT EXTRACTION PHACO AND INTRAOCULAR LENS PLACEMENT ([facility]);  Surgeon: Baruch Goldmann, MD;  Location: [facility];  Service: Ophthalmology;  Laterality: Left;  CDE: 12.32   HAND SURGERY Bilateral    NASAL SEPTOPLASTY W/ TURBINOPLASTY Bilateral 01/26/2016   Procedure: NASAL SEPTOPLASTY WITH BILATERAL TURBINATE REDUCTION;  Surgeon: Leta Baptist, MD;  Location: [facility];  Service: ENT;  Laterality: Bilateral;   NASAL SEPTUM SURGERY  01/26/2016   NASAL SINUS SURGERY Bilateral 01/26/2016   Procedure: BILATERAL ENDOSCOPIC MAXILLARY ANTROSTOMY;  Surgeon: Leta Baptist, MD;  Location: [facility];  Service: ENT;  Laterality: Bilateral;   SHOULDER ARTHROSCOPY WITH ROTATOR CUFF REPAIR Right    TUBAL LIGATION     Social History   Socioeconomic History   Marital  status: Widowed    Spouse name: Not on file   Number of children: 1   Years of education: 9   Highest education level: Not on file  Occupational History   Occupation: Unemployed  Tobacco Use   Smoking status: Some Days    Packs/day: 0.10    Years: 44.00    Pack years: 4.40    Types: Cigarettes   Smokeless tobacco: Never  Vaping Use   Vaping Use: Never used  Substance and Sexual Activity   Alcohol use: No    Alcohol/week: 0.0 standard drinks   Drug use: No   Sexual activity: Not on file  Other Topics Concern   Not on file  Social History Narrative   Lives with boyfriend   Caffeine use: Drinks 1 soda a day    Social Determinants of Health   Financial Resource Strain: Not on file  Food Insecurity: Not on file  Transportation Needs: Not on file  Physical Activity: Not on file  Stress: Not on file  Social Connections: Not on file   Outpatient Encounter Medications as of 08/24/2021  Medication Sig   ACCU-CHEK AVIVA PLUS test strip USE TWICE DAILY TO CHECK SUGAR   acetaminophen (TYLENOL) 500 MG tablet Take 1,500 mg by mouth 2 (two) times daily as needed for moderate pain or headache.   albuterol (PROVENTIL) (2.5 MG/3ML) 0.083% nebulizer solution Take 3 mLs (  2.5 mg total) by nebulization every 6 (six) hours as needed for wheezing.   aspirin EC 81 MG tablet Take 81 mg by mouth daily.   atorvastatin (LIPITOR) 20 MG tablet TAKE ONE TABLET BY MOUTH DAILY (Patient taking differently: Take 20 mg by mouth daily. )   Blood Glucose Monitoring Suppl (ACCU-CHEK AVIVA) device Use as instructed   buPROPion (WELLBUTRIN SR) 150 MG 12 hr tablet Take 150 mg by mouth 2 (two) times daily.   cetirizine (ZYRTEC) 10 MG tablet Take 10 mg by mouth daily.   citalopram (CELEXA) 20 MG tablet Take 40 mg by mouth daily.   clonazePAM (KLONOPIN) 0.5 MG tablet Take 0.5 mg by mouth every 8 (eight) hours as needed for anxiety.    EASY COMFORT PEN NEEDLES 31G X 6 MM MISC USE AS DIRECTED ONCE DAILY.    fluticasone (FLONASE) 50 MCG/ACT nasal spray Place 1 spray into both nostrils daily.   gabapentin (NEURONTIN) 300 MG capsule Take 300 mg by mouth 2 (two) times daily.   glipiZIDE (GLUCOTROL XL) 5 MG 24 hr tablet TAKE ONE TABLET BY MOUTH DAILY WITH BREAKFAST   glucose blood (ACCU-CHEK AVIVA) test strip To test 4 times a day   hydrochlorothiazide (HYDRODIURIL) 25 MG tablet Take 25 mg by mouth daily.   insulin glargine, 1 Unit Dial, (TOUJEO SOLOSTAR) 300 UNIT/ML Solostar Pen Inject 70 Units into the skin at bedtime.   metFORMIN (GLUCOPHAGE) 850 MG tablet Take 850 mg by mouth 2 (two) times daily with a meal.   montelukast (SINGULAIR) 10 MG tablet Take 10 mg by mouth at bedtime.   omeprazole (PRILOSEC) 20 MG capsule Take 20 mg by mouth daily.   PROAIR HFA 108 (90 Base) MCG/ACT inhaler Inhale 2 puffs into the lungs every 4 (four) hours as needed for wheezing or shortness of breath.    rOPINIRole (REQUIP) 0.25 MG tablet Take 0.25 mg by mouth See admin instructions. Take 0.25 mg at night may take a second 0.25 mg dose as needed for restless legs   Tiotropium Bromide Monohydrate (SPIRIVA RESPIMAT) 2.5 MCG/ACT AERS Inhale 2 puffs into the lungs daily.   valsartan (DIOVAN) 160 MG tablet Take 160 mg by mouth daily.   No facility-administered encounter medications on file as of 08/24/2021.   ALLERGIES: Allergies  Allergen Reactions   Lisinopril Cough   VACCINATION STATUS: Immunization History  Administered Date(s) Administered   Influenza,inj,Quad PF,6+ Mos 03/26/2018    Diabetes She presents for her follow-up diabetic visit. She has type 2 diabetes mellitus. Onset time: She was diagnosed at approximate age of 3 years. Her disease course has been improving. There are no hypoglycemic associated symptoms. Pertinent negatives for hypoglycemia include no confusion, headaches, pallor or seizures. Pertinent negatives for diabetes include no blurred vision, no chest pain, no fatigue, no polydipsia, no  polyphagia and no polyuria. There are no hypoglycemic complications. Symptoms are worsening. Diabetic complications include a CVA. (She is a chronic heavy smoker.) Risk factors for coronary artery disease include diabetes mellitus, dyslipidemia, hypertension, obesity, sedentary lifestyle and tobacco exposure. Current diabetic treatment includes oral agent (monotherapy). Her weight is fluctuating minimally. She is following a generally unhealthy diet. When asked about meal planning, she reported none. She rarely participates in exercise. Her home blood glucose trend is decreasing steadily. Her breakfast blood glucose range is generally 130-140 mg/dl. Her bedtime blood glucose range is generally 140-180 mg/dl. Her overall blood glucose range is 140-180 mg/dl. (She presents with improved  control of glycemia.  Her point-of-care  A1c is 7.2% improved from 8.3% . She did not document or report any hypoglycemia.  ) An ACE inhibitor/angiotensin II receptor blocker is being taken. Eye exam is current.  Hyperlipidemia This is a chronic problem. The current episode started more than 1 year ago. The problem is controlled. Recent lipid tests were reviewed and are high. Exacerbating diseases include diabetes and obesity. Associated symptoms include shortness of breath. Pertinent negatives include no chest pain or myalgias. Current antihyperlipidemic treatment includes statins. Risk factors for coronary artery disease include diabetes mellitus, dyslipidemia, hypertension, a sedentary lifestyle, obesity and post-menopausal.  Hypertension This is a chronic problem. The current episode started more than 1 year ago. The problem is controlled. Associated symptoms include shortness of breath. Pertinent negatives include no blurred vision, chest pain, headaches or palpitations. Risk factors for coronary artery disease include diabetes mellitus, dyslipidemia, sedentary lifestyle, smoking/tobacco exposure, post-menopausal state,  family history and obesity. Past treatments include ACE inhibitors. Hypertensive end-organ damage includes CVA.   Review of systems  Limited as above.   Objective:    BP 112/78    Pulse 76    Ht 5\' 1"  (1.549 m)    Wt 220 lb 6.4 oz (100 kg)    BMI 41.64 kg/m   Wt Readings from Last 3 Encounters:  08/24/21 220 lb 6.4 oz (100 kg)  05/26/21 219 lb 12.8 oz (99.7 kg)  01/28/21 221 lb 6.4 oz (100.4 kg)     Physical Exam- Limited  Constitutional:  Body mass index is 41.64 kg/m. , not in acute distress, normal state of mind     CMP     Component Value Date/Time   NA 138 05/09/2021 0811   K 4.9 05/09/2021 0811   CL 96 05/09/2021 0811   CO2 29 05/09/2021 0811   GLUCOSE 119 (H) 05/09/2021 0811   GLUCOSE 160 (H) 02/11/2020 0727   BUN 29 (H) 05/09/2021 0811   CREATININE 0.92 05/09/2021 0811   CREATININE 0.89 02/11/2020 0727   CALCIUM 9.8 05/09/2021 0811   PROT 6.5 05/09/2021 0811   ALBUMIN 4.0 05/09/2021 0811   AST 25 05/09/2021 0811   ALT 24 05/09/2021 0811   ALKPHOS 86 05/09/2021 0811   BILITOT 0.4 05/09/2021 0811   GFRNONAA 68 02/11/2020 0727   GFRAA 79 02/11/2020 0727     Diabetic Labs (most recent): Lab Results  Component Value Date   HGBA1C 7.2 (A) 08/24/2021   HGBA1C 8.3 (A) 05/26/2021   HGBA1C 6.8 01/28/2021   Lipid Panel     Component Value Date/Time   CHOL 119 05/09/2021 0811   TRIG 179 (H) 05/09/2021 0811   HDL 35 (L) 05/09/2021 0811   CHOLHDL 3.4 05/09/2021 0811   CHOLHDL 3.3 02/11/2020 0727   LDLCALC 54 05/09/2021 0811   LDLCALC 68 02/11/2020 0727      Assessment & Plan:   1. Uncontrolled type 2 diabetes mellitus with other circulatory complication, without long-term current use of insulin ([place])   - Patient has currently improving  type 2 DM since  66 years of age.  Lilyan presents with improved  control of glycemia.  Her point-of-care A1c is 7.2% improved from 8.3% . She did not document or report any hypoglycemia.    - Recent labs  reviewed.   Her diabetes is complicated by TIA, chronic heavy smoking and patient remains at a high risk for more acute and chronic complications of diabetes which include CAD, CVA, CKD, retinopathy, and neuropathy. These are all discussed in detail with  the patient.  - I have counseled the patient on diet management and weight loss, by adopting a carbohydrate restricted/protein rich diet.   - she acknowledges that there is a room for improvement in her food and drink choices. - Suggestion is made for her to avoid simple carbohydrates  from her diet including Cakes, Sweet Desserts, Ice Cream, Soda (diet and regular), Sweet Tea, Candies, Chips, Cookies, Store Bought Juices, Alcohol in Excess of  1-2 drinks a day, Artificial Sweeteners,  Coffee Creamer, and "Sugar-free" Products, Lemonade. This will help patient to have more stable blood glucose profile and potentially avoid unintended weight gain.  - I encouraged the patient to switch to  unprocessed or minimally processed complex starch and increased protein intake (animal or plant source), fruits, and vegetables.  - Patient is advised to stick to a routine mealtimes to eat 3 meals  a day and avoid unnecessary snacks ( to snack only to correct hypoglycemia).   - I have approached patient with the following individualized plan to manage diabetes and patient agrees:    -Based on her presentation with near  target glycemic profile, she will not ned prandial insulin.  She is advised to continue Toujeo 70 units nightly, associated with monitoring of blood glucose twice a day-daily before breakfast and at bedtime.  She will not need prandial insulin at this time.   -Patient is encouraged to call clinic for blood glucose levels less than 70 or above 200 mg /dl.  -She is advised to continue metformin 850 mg by mouth twice a day, therapeutically suitable for patient. -She is advised to continue glipizide 5 mg XL p.o. daily at breakfast.   - Patient  specific target  A1c;  LDL, HDL, Triglycerides,  were discussed in detail.  2) BP/HTN:  Her BP is controlled to target.  She is advised to continue her current blood pressure medications including lisinopril 20 mg p.o. daily.    3) Lipids/HPL: Her recent lipid panel showed controlled LDL at 54. she is advised to continue atorvastatin 20 mg p.o. nightly.    Side effects and precautions discussed with her.    4)  Weight/Diet: Her BMI is  [id number] -clearly complicating her diabetes care.  She is a candidate for modest weight loss.  She did not have any further success in weight loss, she still admits significant  dietary indiscretion, CDE Consult  Is in progress  , exercise, and detailed carbohydrates information provided.  5) Subclinical Hypothyroidism- resolved. Her previsit TFTs are c/w euthyroid state.  6) Chronic Care/Health Maintenance:  -Patient is on ACEI and Statin medications and encouraged to continue to follow up with Ophthalmology, Podiatrist at least yearly or according to recommendations, and advised to  quit smoking. I have recommended yearly flu vaccine and pneumonia vaccination at least every 5 years; moderate intensity exercise for up to 150 minutes weekly; and  sleep for at least 7 hours a day.  The patient was counseled on the dangers of tobacco use, and was advised to quit.  Reviewed strategies to maximize success, including removing cigarettes and smoking materials from environment.   She had normal ABI on September 01, 2020, this study will be repeated in March 2027 or sooner if needed.   - I advised patient to maintain close follow up with Burdine, Virgina Evener, MD for primary care needs.     I spent 44 minutes in the care of the patient today including review of labs from CMP, Lipids, Thyroid Function, Hematology (current  and previous including abstractions from other facilities); face-to-face time discussing  her blood glucose readings/logs, discussing hypoglycemia and  hyperglycemia episodes and symptoms, medications doses, her options of short and long term treatment based on the latest standards of care / guidelines;  discussion about incorporating lifestyle medicine;  and documenting the encounter.    Please refer to Patient Instructions for Blood Glucose Monitoring and Insulin/Medications Dosing Guide"  in media tab for additional information. Please  also refer to " Patient Self Inventory" in the Media  tab for reviewed elements of pertinent patient history.  Melinda Crutch participated in the discussions, expressed understanding, and voiced agreement with the above plans.  All questions were answered to her satisfaction. she is encouraged to contact clinic should she have any questions or concerns prior to her return visit.   Follow up plan: - Return in about 4 months (around 12/24/2021) for F/U with Pre-visit Labs, Meter, Logs, A1c here.Glade Lloyd, MD Phone: [phone]  Fax: [phone]   -  This note was partially dictated with voice recognition software. Similar sounding words can be transcribed inadequately or may not  be corrected upon review.  08/24/2021, 3:44 PM

## 2021-08-24 NOTE — Patient Instructions (Signed)

## 2021-08-25 DIAGNOSIS — E1165 Type 2 diabetes mellitus with hyperglycemia: Secondary | ICD-10-CM | POA: Diagnosis not present

## 2021-08-25 DIAGNOSIS — F17210 Nicotine dependence, cigarettes, uncomplicated: Secondary | ICD-10-CM | POA: Diagnosis not present

## 2021-08-25 DIAGNOSIS — J449 Chronic obstructive pulmonary disease, unspecified: Secondary | ICD-10-CM | POA: Diagnosis not present

## 2021-08-25 DIAGNOSIS — G4733 Obstructive sleep apnea (adult) (pediatric): Secondary | ICD-10-CM | POA: Diagnosis not present

## 2021-08-25 DIAGNOSIS — I10 Essential (primary) hypertension: Secondary | ICD-10-CM | POA: Diagnosis not present

## 2021-08-25 DIAGNOSIS — J439 Emphysema, unspecified: Secondary | ICD-10-CM | POA: Diagnosis not present

## 2021-11-08 ENCOUNTER — Other Ambulatory Visit: Payer: Self-pay | Admitting: "Endocrinology

## 2021-11-09 ENCOUNTER — Other Ambulatory Visit: Payer: Self-pay | Admitting: "Endocrinology

## 2021-11-24 DIAGNOSIS — E7800 Pure hypercholesterolemia, unspecified: Secondary | ICD-10-CM | POA: Diagnosis not present

## 2021-11-24 DIAGNOSIS — E875 Hyperkalemia: Secondary | ICD-10-CM | POA: Diagnosis not present

## 2021-11-24 DIAGNOSIS — E1165 Type 2 diabetes mellitus with hyperglycemia: Secondary | ICD-10-CM | POA: Diagnosis not present

## 2021-11-24 DIAGNOSIS — R5383 Other fatigue: Secondary | ICD-10-CM | POA: Diagnosis not present

## 2021-11-24 DIAGNOSIS — I10 Essential (primary) hypertension: Secondary | ICD-10-CM | POA: Diagnosis not present

## 2021-11-29 DIAGNOSIS — F17210 Nicotine dependence, cigarettes, uncomplicated: Secondary | ICD-10-CM | POA: Diagnosis not present

## 2021-11-29 DIAGNOSIS — J439 Emphysema, unspecified: Secondary | ICD-10-CM | POA: Diagnosis not present

## 2021-11-29 DIAGNOSIS — I700 Atherosclerosis of aorta: Secondary | ICD-10-CM | POA: Diagnosis not present

## 2021-11-29 DIAGNOSIS — M48061 Spinal stenosis, lumbar region without neurogenic claudication: Secondary | ICD-10-CM | POA: Diagnosis not present

## 2021-11-29 DIAGNOSIS — J449 Chronic obstructive pulmonary disease, unspecified: Secondary | ICD-10-CM | POA: Diagnosis not present

## 2021-11-29 DIAGNOSIS — M26622 Arthralgia of left temporomandibular joint: Secondary | ICD-10-CM | POA: Diagnosis not present

## 2021-11-29 DIAGNOSIS — I10 Essential (primary) hypertension: Secondary | ICD-10-CM | POA: Diagnosis not present

## 2021-11-29 DIAGNOSIS — M1711 Unilateral primary osteoarthritis, right knee: Secondary | ICD-10-CM | POA: Diagnosis not present

## 2021-11-29 DIAGNOSIS — E1165 Type 2 diabetes mellitus with hyperglycemia: Secondary | ICD-10-CM | POA: Diagnosis not present

## 2021-11-29 DIAGNOSIS — R7989 Other specified abnormal findings of blood chemistry: Secondary | ICD-10-CM | POA: Diagnosis not present

## 2021-11-30 DIAGNOSIS — G4733 Obstructive sleep apnea (adult) (pediatric): Secondary | ICD-10-CM | POA: Diagnosis not present

## 2021-11-30 DIAGNOSIS — F17210 Nicotine dependence, cigarettes, uncomplicated: Secondary | ICD-10-CM | POA: Diagnosis not present

## 2021-11-30 DIAGNOSIS — I700 Atherosclerosis of aorta: Secondary | ICD-10-CM | POA: Diagnosis not present

## 2021-11-30 DIAGNOSIS — Z0001 Encounter for general adult medical examination with abnormal findings: Secondary | ICD-10-CM | POA: Diagnosis not present

## 2021-11-30 DIAGNOSIS — I10 Essential (primary) hypertension: Secondary | ICD-10-CM | POA: Diagnosis not present

## 2021-11-30 DIAGNOSIS — E1165 Type 2 diabetes mellitus with hyperglycemia: Secondary | ICD-10-CM | POA: Diagnosis not present

## 2021-12-08 DIAGNOSIS — Z1231 Encounter for screening mammogram for malignant neoplasm of breast: Secondary | ICD-10-CM | POA: Diagnosis not present

## 2021-12-26 DIAGNOSIS — E1169 Type 2 diabetes mellitus with other specified complication: Secondary | ICD-10-CM | POA: Diagnosis not present

## 2021-12-27 LAB — COMPREHENSIVE METABOLIC PANEL
ALT: 17 IU/L (ref 0–32)
AST: 20 IU/L (ref 0–40)
Albumin/Globulin Ratio: 1.4 (ref 1.2–2.2)
Albumin: 4.1 g/dL (ref 3.8–4.8)
Alkaline Phosphatase: 80 IU/L (ref 44–121)
BUN/Creatinine Ratio: 19 (ref 12–28)
BUN: 16 mg/dL (ref 8–27)
Bilirubin Total: 0.4 mg/dL (ref 0.0–1.2)
CO2: 28 mmol/L (ref 20–29)
Calcium: 9.5 mg/dL (ref 8.7–10.3)
Chloride: 98 mmol/L (ref 96–106)
Creatinine, Ser: 0.84 mg/dL (ref 0.57–1.00)
Globulin, Total: 2.9 g/dL (ref 1.5–4.5)
Glucose: 198 mg/dL — ABNORMAL HIGH (ref 70–99)
Potassium: 5 mmol/L (ref 3.5–5.2)
Sodium: 141 mmol/L (ref 134–144)
Total Protein: 7 g/dL (ref 6.0–8.5)
eGFR: 77 mL/min/{1.73_m2} (ref >59)

## 2021-12-30 ENCOUNTER — Ambulatory Visit (INDEPENDENT_AMBULATORY_CARE_PROVIDER_SITE_OTHER): Payer: Medicare Other | Admitting: "Endocrinology

## 2021-12-30 ENCOUNTER — Encounter: Payer: Self-pay | Admitting: "Endocrinology

## 2021-12-30 VITALS — BP 130/74 | HR 76 | Ht 61.0 in | Wt 223.8 lb

## 2021-12-30 DIAGNOSIS — E782 Mixed hyperlipidemia: Secondary | ICD-10-CM | POA: Diagnosis not present

## 2021-12-30 DIAGNOSIS — E669 Obesity, unspecified: Secondary | ICD-10-CM

## 2021-12-30 DIAGNOSIS — E1169 Type 2 diabetes mellitus with other specified complication: Secondary | ICD-10-CM | POA: Diagnosis not present

## 2021-12-30 DIAGNOSIS — I10 Essential (primary) hypertension: Secondary | ICD-10-CM | POA: Diagnosis not present

## 2021-12-30 DIAGNOSIS — F17200 Nicotine dependence, unspecified, uncomplicated: Secondary | ICD-10-CM | POA: Diagnosis not present

## 2021-12-30 LAB — POCT GLYCOSYLATED HEMOGLOBIN (HGB A1C): HbA1c, POC (controlled diabetic range): 7.5 % — AB (ref 0.0–7.0)

## 2021-12-30 MED ORDER — FREESTYLE LIBRE 2 READER DEVI: 0 refills | Status: DC

## 2021-12-30 MED ORDER — FREESTYLE LIBRE 2 SENSOR MISC: 1.0000 | 3 refills | Status: DC

## 2021-12-30 NOTE — Progress Notes (Signed)
12/30/2021   Endocrinology follow-up note   Subjective:    Patient ID: Victoria Lara, female    DOB: 1956/04/27.  She is here in follow-up for the management of symptomatic type 2 diabetes, hyperlipidemia, hypertension.    PMD:   Juliette Alcide, MD  Past Medical History:  Diagnosis Date   Anemia    Anxiety    Arthritis    Bronchitis    Complication of anesthesia    had to come back to ER after shoulder surgery at [facility]; hard to wake up; low O2 sats   Depression    Diabetes mellitus without complication (HCC)    GERD (gastroesophageal reflux disease)    Hyperlipidemia    Hypertension    Neuropathy    Takes Gabapentin   Pneumonia 2016   Restless leg syndrome    Seasonal allergies    Shortness of breath dyspnea    Sleep apnea    Syncope    TIA (transient ischemic attack) 2006   Past Surgical History:  Procedure Laterality Date   BREAST LUMPECTOMY Bilateral    CATARACT EXTRACTION W/PHACO Right 08/20/2014   Procedure: CATARACT EXTRACTION PHACO AND INTRAOCULAR LENS PLACEMENT (IOC);  Surgeon: Gemma Payor, MD;  Location: [facility];  Service: Ophthalmology;  Laterality: Right;  CDE:5.31   CATARACT EXTRACTION W/PHACO Left 01/19/2020   Procedure: CATARACT EXTRACTION PHACO AND INTRAOCULAR LENS PLACEMENT (IOC);  Surgeon: Fabio Pierce, MD;  Location: [facility];  Service: Ophthalmology;  Laterality: Left;  CDE: 12.32   HAND SURGERY Bilateral    NASAL SEPTOPLASTY W/ TURBINOPLASTY Bilateral 01/26/2016   Procedure: NASAL SEPTOPLASTY WITH BILATERAL TURBINATE REDUCTION;  Surgeon: Newman Pies, MD;  Location: [facility];  Service: ENT;  Laterality: Bilateral;   NASAL SEPTUM SURGERY  01/26/2016   NASAL SINUS SURGERY Bilateral 01/26/2016   Procedure: BILATERAL ENDOSCOPIC MAXILLARY ANTROSTOMY;  Surgeon: Newman Pies, MD;  Location: [facility];  Service: ENT;  Laterality: Bilateral;   SHOULDER ARTHROSCOPY WITH ROTATOR CUFF REPAIR Right    TUBAL LIGATION     Social History   Socioeconomic History   Marital  status: Widowed    Spouse name: Not on file   Number of children: 1   Years of education: 74   Highest education level: Not on file  Occupational History   Occupation: Unemployed  Tobacco Use   Smoking status: Some Days    Packs/day: 0.10    Years: 44.00    Total pack years: 4.40    Types: Cigarettes   Smokeless tobacco: Never  Vaping Use   Vaping Use: Never used  Substance and Sexual Activity   Alcohol use: No    Alcohol/week: 0.0 standard drinks of alcohol   Drug use: No   Sexual activity: Not on file  Other Topics Concern   Not on file  Social History Narrative   Lives with boyfriend   Caffeine use: Drinks 1 soda a day    Social Determinants of Health   Financial Resource Strain: Not on file  Food Insecurity: Not on file  Transportation Needs: Not on file  Physical Activity: Not on file  Stress: Not on file  Social Connections: Not on file   Outpatient Encounter Medications as of 12/30/2021  Medication Sig   Continuous Blood Gluc Receiver (FREESTYLE LIBRE 2 READER) DEVI As directed   Continuous Blood Gluc Sensor (FREESTYLE LIBRE 2 SENSOR) MISC 1 Piece by Does not apply route every 14 (fourteen) days.   ACCU-CHEK AVIVA PLUS test strip USE TWICE DAILY  TO CHECK SUGAR   acetaminophen (TYLENOL) 500 MG tablet Take 1,500 mg by mouth 2 (two) times daily as needed for moderate pain or headache.   albuterol (PROVENTIL) (2.5 MG/3ML) 0.083% nebulizer solution Take 3 mLs (2.5 mg total) by nebulization every 6 (six) hours as needed for wheezing.   aspirin EC 81 MG tablet Take 81 mg by mouth daily.   atorvastatin (LIPITOR) 20 MG tablet TAKE ONE TABLET BY MOUTH DAILY (Patient taking differently: Take 20 mg by mouth daily. )   Blood Glucose Monitoring Suppl (ACCU-CHEK AVIVA) device Use as instructed   buPROPion (WELLBUTRIN SR) 150 MG 12 hr tablet Take 150 mg by mouth 2 (two) times daily.   cetirizine (ZYRTEC) 10 MG tablet Take 10 mg by mouth daily.   citalopram (CELEXA) 20 MG tablet  Take 40 mg by mouth daily.   clonazePAM (KLONOPIN) 0.5 MG tablet Take 0.5 mg by mouth every 8 (eight) hours as needed for anxiety.    EASY COMFORT PEN NEEDLES 31G X 6 MM MISC USE AS DIRECTED ONCE DAILY.   fluticasone (FLONASE) 50 MCG/ACT nasal spray Place 1 spray into both nostrils daily.   gabapentin (NEURONTIN) 300 MG capsule Take 300 mg by mouth 2 (two) times daily.   glipiZIDE (GLUCOTROL XL) 5 MG 24 hr tablet TAKE ONE TABLET BY MOUTH DAILY WITH BREAKFAST   glucose blood (ACCU-CHEK AVIVA) test strip To test 4 times a day   hydrochlorothiazide (HYDRODIURIL) 25 MG tablet Take 25 mg by mouth daily.   metFORMIN (GLUCOPHAGE) 850 MG tablet Take 850 mg by mouth 2 (two) times daily with a meal.   montelukast (SINGULAIR) 10 MG tablet Take 10 mg by mouth at bedtime.   omeprazole (PRILOSEC) 20 MG capsule Take 20 mg by mouth daily.   PROAIR HFA 108 (90 Base) MCG/ACT inhaler Inhale 2 puffs into the lungs every 4 (four) hours as needed for wheezing or shortness of breath.    rOPINIRole (REQUIP) 0.25 MG tablet Take 0.25 mg by mouth See admin instructions. Take 0.25 mg at night may take a second 0.25 mg dose as needed for restless legs   TOUJEO SOLOSTAR 300 UNIT/ML Solostar Pen INJECT 70 UNITS INTO THE SKIN AT BEDTIME   TRELEGY ELLIPTA 100-62.5-25 MCG/ACT AEPB Inhale 1 puff into the lungs daily.   valsartan (DIOVAN) 160 MG tablet Take 160 mg by mouth daily.   [DISCONTINUED] Tiotropium Bromide Monohydrate (SPIRIVA RESPIMAT) 2.5 MCG/ACT AERS Inhale 2 puffs into the lungs daily.   No facility-administered encounter medications on file as of 12/30/2021.   ALLERGIES: Allergies  Allergen Reactions   Lisinopril Cough   VACCINATION STATUS: Immunization History  Administered Date(s) Administered   Influenza,inj,Quad PF,6+ Mos 03/26/2018    Diabetes She presents for her follow-up diabetic visit. She has type 2 diabetes mellitus. Onset time: She was diagnosed at approximate age of 44 years. Her disease course  has been fluctuating. There are no hypoglycemic associated symptoms. Pertinent negatives for hypoglycemia include no confusion, headaches, pallor or seizures. Pertinent negatives for diabetes include no blurred vision, no chest pain, no fatigue, no polydipsia, no polyphagia and no polyuria. There are no hypoglycemic complications. Symptoms are worsening. Diabetic complications include a CVA. (She is a chronic heavy smoker.) Risk factors for coronary artery disease include diabetes mellitus, dyslipidemia, hypertension, obesity, sedentary lifestyle and tobacco exposure. Current diabetic treatment includes oral agent (monotherapy). Her weight is fluctuating minimally. She is following a generally unhealthy diet. When asked about meal planning, she reported none. She rarely  participates in exercise. Her home blood glucose trend is decreasing steadily. Her breakfast blood glucose range is generally 130-140 mg/dl. Her bedtime blood glucose range is generally 140-180 mg/dl. Her overall blood glucose range is 140-180 mg/dl. (She presents with near target glycemic profile.  Her average blood glucose is between 131 and 173 for the last month.  Her point-of-care A1c is 7.5%, she did not document any hypoglycemia.   ) An ACE inhibitor/angiotensin II receptor blocker is being taken. Eye exam is current.  Hyperlipidemia This is a chronic problem. The current episode started more than 1 year ago. The problem is controlled. Recent lipid tests were reviewed and are high. Exacerbating diseases include diabetes and obesity. Associated symptoms include shortness of breath. Pertinent negatives include no chest pain or myalgias. Current antihyperlipidemic treatment includes statins. Risk factors for coronary artery disease include diabetes mellitus, dyslipidemia, hypertension, a sedentary lifestyle, obesity and post-menopausal.  Hypertension This is a chronic problem. The current episode started more than 1 year ago. The problem is  controlled. Associated symptoms include shortness of breath. Pertinent negatives include no blurred vision, chest pain, headaches or palpitations. Risk factors for coronary artery disease include diabetes mellitus, dyslipidemia, sedentary lifestyle, smoking/tobacco exposure, post-menopausal state, family history and obesity. Past treatments include ACE inhibitors. Hypertensive end-organ damage includes CVA.    Review of systems  Limited as above.   Objective:    BP 130/74   Pulse 76   Ht 5\' 1"  (1.549 m)   Wt 223 lb 12.8 oz (101.5 kg)   BMI 42.29 kg/m   Wt Readings from Last 3 Encounters:  12/30/21 223 lb 12.8 oz (101.5 kg)  08/24/21 220 lb 6.4 oz (100 kg)  05/26/21 219 lb 12.8 oz (99.7 kg)     Physical Exam- Limited  Constitutional:  Body mass index is 42.29 kg/m. , not in acute distress, normal state of mind   CMP     Component Value Date/Time   NA 141 12/26/2021 0927   K 5.0 12/26/2021 0927   CL 98 12/26/2021 0927   CO2 28 12/26/2021 0927   GLUCOSE 198 (H) 12/26/2021 0927   GLUCOSE 160 (H) 02/11/2020 0727   BUN 16 12/26/2021 0927   CREATININE 0.84 12/26/2021 0927   CREATININE 0.89 02/11/2020 0727   CALCIUM 9.5 12/26/2021 0927   PROT 7.0 12/26/2021 0927   ALBUMIN 4.1 12/26/2021 0927   AST 20 12/26/2021 0927   ALT 17 12/26/2021 0927   ALKPHOS 80 12/26/2021 0927   BILITOT 0.4 12/26/2021 0927   GFRNONAA 68 02/11/2020 0727   GFRAA 79 02/11/2020 0727     Diabetic Labs (most recent): Lab Results  Component Value Date   HGBA1C 7.5 (A) 12/30/2021   HGBA1C 7.2 (A) 08/24/2021   HGBA1C 8.3 (A) 05/26/2021   MICROALBUR 30 05/26/2021   MICROALBUR 1.1 02/11/2020   MICROALBUR 22.8 11/27/2019   Lipid Panel     Component Value Date/Time   CHOL 119 05/09/2021 0811   TRIG 179 (H) 05/09/2021 0811   HDL 35 (L) 05/09/2021 0811   CHOLHDL 3.4 05/09/2021 0811   CHOLHDL 3.3 02/11/2020 0727   LDLCALC 54 05/09/2021 0811   LDLCALC 68 02/11/2020 0727      Assessment &  Plan:   1. Uncontrolled type 2 diabetes mellitus with other circulatory complication, without long-term current use of insulin (HCC)   - Patient has currently improving  type 2 DM since  66 years of age.  She presents with near target glycemic profile.  Her average blood glucose is between 131 and 173 for the last month.  Her point-of-care A1c is 7.5%, she did not document any hypoglycemia.   - Recent labs reviewed.   Her diabetes is complicated by TIA, chronic heavy smoking and patient remains at a high risk for more acute and chronic complications of diabetes which include CAD, CVA, CKD, retinopathy, and neuropathy. These are all discussed in detail with the patient.  - I have counseled the patient on diet management and weight loss, by adopting a carbohydrate restricted/protein rich diet.  - she acknowledges that there is a room for improvement in her food and drink choices. - Suggestion is made for her to avoid simple carbohydrates  from her diet including Cakes, Sweet Desserts, Ice Cream, Soda (diet and regular), Sweet Tea, Candies, Chips, Cookies, Store Bought Juices, Alcohol , Artificial Sweeteners,  Coffee Creamer, and "Sugar-free" Products, Lemonade. This will help patient to have more stable blood glucose profile and potentially avoid unintended weight gain.  The following Lifestyle Medicine recommendations according to American College of Lifestyle Medicine  [facility]( [facility]) were discussed and and offered to patient and she  agrees to start the journey:  A. Whole Foods, Plant-Based Nutrition comprising of fruits and vegetables, plant-based proteins, whole-grain carbohydrates was discussed in detail with the patient.   A list for source of those nutrients were also provided to the patient.  Patient will use only water or unsweetened tea for hydration. B.  The need to stay away from risky substances including alcohol, smoking; obtaining 7 to 9 hours of restorative sleep, at least 150 minutes of  moderate intensity exercise weekly, the importance of healthy social connections,  and stress management techniques were discussed. C.  A full color page of  Calorie density of various food groups per pound showing examples of each food groups was provided to the patient.    - I encouraged the patient to switch to  unprocessed or minimally processed complex starch and increased protein intake (animal or plant source), fruits, and vegetables.  - Patient is advised to stick to a routine mealtimes to eat 3 meals  a day and avoid unnecessary snacks ( to snack only to correct hypoglycemia).   - I have approached patient with the following individualized plan to manage diabetes and patient agrees:    -Based on her presentation with near  target glycemic profile, she will not need prandial insulin for now.  She is advised to continue Toujeo 70 units nightly, associated with monitoring of blood glucose twice a day-daily before breakfast and at bedtime.  She is interested to get a CGM.  I discussed and prescribed the freestyle libre device for her.  It would be useful if her insurance provides coverage.   -Patient is encouraged to call clinic for blood glucose levels less than 70 or above 200 mg /dl.  -She is advised to continue metformin 850 mg by mouth twice a day, therapeutically suitable for patient. -She is advised to continue glipizide 5 mg XL p.o. daily at breakfast.   - Patient specific target  A1c;  LDL, HDL, Triglycerides,  were discussed in detail. She is advised to quit smoking, will be considered for low-dose GLP-1 receptor agonist next visit.  2) BP/HTN:  Her blood pressure is controlled to target.  She is advised to continue her current blood pressure medications including lisinopril 20 mg p.o. daily.    3) Lipids/HPL: Her recent lipid panel showed controlled LDL at 54. she is advised  to continue atorvastatin 20 mg p.o. nightly.   Side effects and precautions discussed with her.    4)   Weight/Diet: Her BMI is 42.29- -clearly complicating her diabetes care.  She is a candidate for modest weight loss.  She did not have any further success in weight loss, she still admits significant  dietary indiscretion, CDE Consult  Is in progress  , exercise, and detailed carbohydrates information provided. Possible plant-based diet discussed above will help with her chronic comorbidities including obesity.  6) Chronic Care/Health Maintenance:  -Patient is on ACEI and Statin medications and encouraged to continue to follow up with Ophthalmology, Podiatrist at least yearly or according to recommendations, and advised to  quit smoking. I have recommended yearly flu vaccine and pneumonia vaccination at least every 5 years; moderate intensity exercise for up to 150 minutes weekly; and  sleep for at least 7 hours a day.  The patient was counseled on the dangers of tobacco use, and was advised to quit.  Reviewed strategies to maximize success, including removing cigarettes and smoking materials from environment.  She had normal ABI on September 01, 2020, this study will be repeated in March 2027 or sooner if needed.   - I advised patient to maintain close follow up with Burdine, Ananias Pilgrim, MD for primary care needs.   I spent 41 minutes in the care of the patient today including review of labs from CMP, Lipids, Thyroid Function, Hematology (current and previous including abstractions from other facilities); face-to-face time discussing  her blood glucose readings/logs, discussing hypoglycemia and hyperglycemia episodes and symptoms, medications doses, her options of short and long term treatment based on the latest standards of care / guidelines;  discussion about incorporating lifestyle medicine;  and documenting the encounter. Risk reduction counseling performed per USPSTF guidelines to reduce obesity and cardiovascular risk factors.     Please refer to Patient Instructions for Blood Glucose Monitoring  and Insulin/Medications Dosing Guide"  in media tab for additional information. Please  also refer to " Patient Self Inventory" in the Media  tab for reviewed elements of pertinent patient history.  Lurlean Horns participated in the discussions, expressed understanding, and voiced agreement with the above plans.  All questions were answered to her satisfaction. she is encouraged to contact clinic should she have any questions or concerns prior to her return visit.   Follow up plan: - Return in about 4 months (around 05/02/2022) for Bring Meter/CGM Device/Logs- A1c in Office.  Marquis Lunch, MD Phone: [phone]  Fax: [phone]   -  This note was partially dictated with voice recognition software. Similar sounding words can be transcribed inadequately or may not  be corrected upon review.  12/30/2021, 2:15 PM

## 2021-12-30 NOTE — Patient Instructions (Signed)

## 2022-01-02 ENCOUNTER — Telehealth: Payer: Self-pay

## 2022-01-02 NOTE — Telephone Encounter (Signed)
Order for Freestyle Libre 2 system sent to [employer]. 

## 2022-01-04 DIAGNOSIS — E1169 Type 2 diabetes mellitus with other specified complication: Secondary | ICD-10-CM | POA: Diagnosis not present

## 2022-01-25 DIAGNOSIS — I700 Atherosclerosis of aorta: Secondary | ICD-10-CM | POA: Diagnosis not present

## 2022-01-25 DIAGNOSIS — J439 Emphysema, unspecified: Secondary | ICD-10-CM | POA: Diagnosis not present

## 2022-01-25 DIAGNOSIS — F17210 Nicotine dependence, cigarettes, uncomplicated: Secondary | ICD-10-CM | POA: Diagnosis not present

## 2022-01-25 DIAGNOSIS — Z122 Encounter for screening for malignant neoplasm of respiratory organs: Secondary | ICD-10-CM | POA: Diagnosis not present

## 2022-01-31 ENCOUNTER — Other Ambulatory Visit: Payer: Self-pay | Admitting: "Endocrinology

## 2022-02-04 DIAGNOSIS — E1169 Type 2 diabetes mellitus with other specified complication: Secondary | ICD-10-CM | POA: Diagnosis not present

## 2022-02-15 ENCOUNTER — Other Ambulatory Visit: Payer: Self-pay | Admitting: "Endocrinology

## 2022-02-28 DIAGNOSIS — L0390 Cellulitis, unspecified: Secondary | ICD-10-CM | POA: Diagnosis not present

## 2022-03-07 DIAGNOSIS — E1169 Type 2 diabetes mellitus with other specified complication: Secondary | ICD-10-CM | POA: Diagnosis not present

## 2022-03-26 ENCOUNTER — Other Ambulatory Visit: Payer: Self-pay | Admitting: "Endocrinology

## 2022-04-06 DIAGNOSIS — E1169 Type 2 diabetes mellitus with other specified complication: Secondary | ICD-10-CM | POA: Diagnosis not present

## 2022-05-02 ENCOUNTER — Ambulatory Visit (INDEPENDENT_AMBULATORY_CARE_PROVIDER_SITE_OTHER): Payer: Medicare Other | Admitting: "Endocrinology

## 2022-05-02 ENCOUNTER — Encounter: Payer: Self-pay | Admitting: "Endocrinology

## 2022-05-02 VITALS — BP 136/82 | HR 76 | Ht 61.0 in | Wt 213.4 lb

## 2022-05-02 DIAGNOSIS — E782 Mixed hyperlipidemia: Secondary | ICD-10-CM

## 2022-05-02 DIAGNOSIS — I10 Essential (primary) hypertension: Secondary | ICD-10-CM | POA: Diagnosis not present

## 2022-05-02 DIAGNOSIS — E1169 Type 2 diabetes mellitus with other specified complication: Secondary | ICD-10-CM

## 2022-05-02 DIAGNOSIS — E669 Obesity, unspecified: Secondary | ICD-10-CM | POA: Diagnosis not present

## 2022-05-02 DIAGNOSIS — F17200 Nicotine dependence, unspecified, uncomplicated: Secondary | ICD-10-CM | POA: Diagnosis not present

## 2022-05-02 LAB — POCT GLYCOSYLATED HEMOGLOBIN (HGB A1C): HbA1c, POC (controlled diabetic range): 6.2 % (ref 0.0–7.0)

## 2022-05-02 MED ORDER — TOUJEO SOLOSTAR 300 UNIT/ML ~~LOC~~ SOPN: 60.0000 [IU] | PEN_INJECTOR | Freq: Every day | SUBCUTANEOUS | 2 refills | Status: DC

## 2022-05-02 NOTE — Progress Notes (Signed)
05/02/2022   Endocrinology follow-up note   Subjective:    Patient ID: Victoria Lara, female    DOB: Aug 17, 1955.  She is here in follow-up for the management of symptomatic type 2 diabetes, hyperlipidemia, hypertension.    PMD:   Juliette Alcide, MD  Past Medical History:  Diagnosis Date   Anemia    Anxiety    Arthritis    Bronchitis    Complication of anesthesia    had to come back to ER after shoulder surgery at [facility]; hard to wake up; low O2 sats   Depression    Diabetes mellitus without complication (HCC)    GERD (gastroesophageal reflux disease)    Hyperlipidemia    Hypertension    Neuropathy    Takes Gabapentin   Pneumonia 2016   Restless leg syndrome    Seasonal allergies    Shortness of breath dyspnea    Sleep apnea    Syncope    TIA (transient ischemic attack) 2006   Past Surgical History:  Procedure Laterality Date   BREAST LUMPECTOMY Bilateral    CATARACT EXTRACTION W/PHACO Right 08/20/2014   Procedure: CATARACT EXTRACTION PHACO AND INTRAOCULAR LENS PLACEMENT (IOC);  Surgeon: Gemma Payor, MD;  Location: [facility];  Service: Ophthalmology;  Laterality: Right;  CDE:5.31   CATARACT EXTRACTION W/PHACO Left 01/19/2020   Procedure: CATARACT EXTRACTION PHACO AND INTRAOCULAR LENS PLACEMENT (IOC);  Surgeon: Fabio Pierce, MD;  Location: [facility];  Service: Ophthalmology;  Laterality: Left;  CDE: 12.32   HAND SURGERY Bilateral    NASAL SEPTOPLASTY W/ TURBINOPLASTY Bilateral 01/26/2016   Procedure: NASAL SEPTOPLASTY WITH BILATERAL TURBINATE REDUCTION;  Surgeon: Newman Pies, MD;  Location: [facility];  Service: ENT;  Laterality: Bilateral;   NASAL SEPTUM SURGERY  01/26/2016   NASAL SINUS SURGERY Bilateral 01/26/2016   Procedure: BILATERAL ENDOSCOPIC MAXILLARY ANTROSTOMY;  Surgeon: Newman Pies, MD;  Location: [facility];  Service: ENT;  Laterality: Bilateral;   SHOULDER ARTHROSCOPY WITH ROTATOR CUFF REPAIR Right    TUBAL LIGATION     Social History   Socioeconomic History   Marital  status: Widowed    Spouse name: Not on file   Number of children: 1   Years of education: 62   Highest education level: Not on file  Occupational History   Occupation: Unemployed  Tobacco Use   Smoking status: Some Days    Packs/day: 0.10    Years: 44.00    Total pack years: 4.40    Types: Cigarettes   Smokeless tobacco: Never  Vaping Use   Vaping Use: Never used  Substance and Sexual Activity   Alcohol use: No    Alcohol/week: 0.0 standard drinks of alcohol   Drug use: No   Sexual activity: Not on file  Other Topics Concern   Not on file  Social History Narrative   Lives with boyfriend   Caffeine use: Drinks 1 soda a day    Social Determinants of Health   Financial Resource Strain: Not on file  Food Insecurity: Not on file  Transportation Needs: Not on file  Physical Activity: Not on file  Stress: Not on file  Social Connections: Not on file   Outpatient Encounter Medications as of 05/02/2022  Medication Sig   ACCU-CHEK AVIVA PLUS test strip USE TWICE DAILY TO CHECK SUGAR   ACCU-CHEK GUIDE test strip USE ONE STRIP TO TEST BLOOD SUGAR TWICE DAILY   acetaminophen (TYLENOL) 500 MG tablet Take 1,500 mg by mouth 2 (two) times daily as  needed for moderate pain or headache.   albuterol (PROVENTIL) (2.5 MG/3ML) 0.083% nebulizer solution Take 3 mLs (2.5 mg total) by nebulization every 6 (six) hours as needed for wheezing.   aspirin EC 81 MG tablet Take 81 mg by mouth daily.   atorvastatin (LIPITOR) 20 MG tablet TAKE ONE TABLET BY MOUTH DAILY (Patient taking differently: Take 20 mg by mouth daily. )   Blood Glucose Monitoring Suppl (ACCU-CHEK AVIVA) device Use as instructed   buPROPion (WELLBUTRIN SR) 150 MG 12 hr tablet Take 150 mg by mouth 2 (two) times daily.   cetirizine (ZYRTEC) 10 MG tablet Take 10 mg by mouth daily.   citalopram (CELEXA) 20 MG tablet Take 40 mg by mouth daily.   clonazePAM (KLONOPIN) 0.5 MG tablet Take 0.5 mg by mouth every 8 (eight) hours as needed for  anxiety.    Continuous Blood Gluc Receiver (FREESTYLE LIBRE 2 READER) DEVI As directed   Continuous Blood Gluc Sensor (FREESTYLE LIBRE 2 SENSOR) MISC 1 Piece by Does not apply route every 14 (fourteen) days.   EASY COMFORT PEN NEEDLES 31G X 6 MM MISC USE AS DIRECTED ONCE DAILY.   fluticasone (FLONASE) 50 MCG/ACT nasal spray Place 1 spray into both nostrils daily.   gabapentin (NEURONTIN) 300 MG capsule Take 300 mg by mouth 2 (two) times daily.   glipiZIDE (GLUCOTROL XL) 5 MG 24 hr tablet TAKE ONE TABLET BY MOUTH DAILY WITH BREAKFAST   glucose blood (ACCU-CHEK AVIVA) test strip To test 4 times a day   hydrochlorothiazide (HYDRODIURIL) 25 MG tablet Take 25 mg by mouth daily.   insulin glargine, 1 Unit Dial, (TOUJEO SOLOSTAR) 300 UNIT/ML Solostar Pen Inject 60 Units into the skin at bedtime.   metFORMIN (GLUCOPHAGE) 850 MG tablet Take 850 mg by mouth 2 (two) times daily with a meal.   montelukast (SINGULAIR) 10 MG tablet Take 10 mg by mouth at bedtime.   omeprazole (PRILOSEC) 20 MG capsule Take 20 mg by mouth daily.   PROAIR HFA 108 (90 Base) MCG/ACT inhaler Inhale 2 puffs into the lungs every 4 (four) hours as needed for wheezing or shortness of breath.    rOPINIRole (REQUIP) 0.25 MG tablet Take 0.25 mg by mouth See admin instructions. Take 0.25 mg at night may take a second 0.25 mg dose as needed for restless legs   TRELEGY ELLIPTA 100-62.5-25 MCG/ACT AEPB Inhale 1 puff into the lungs daily.   valsartan (DIOVAN) 160 MG tablet Take 160 mg by mouth daily.   [DISCONTINUED] TOUJEO SOLOSTAR 300 UNIT/ML Solostar Pen INJECT 70 UNITS SUBCUTANEOUSLY AT BEDTIME   No facility-administered encounter medications on file as of 05/02/2022.   ALLERGIES: Allergies  Allergen Reactions   Lisinopril Cough   VACCINATION STATUS: Immunization History  Administered Date(s) Administered   Influenza,inj,Quad PF,6+ Mos 03/26/2018    Diabetes She presents for her follow-up diabetic visit. She has type 2 diabetes  mellitus. Onset time: She was diagnosed at approximate age of 66 years. Her disease course has been improving. There are no hypoglycemic associated symptoms. Pertinent negatives for hypoglycemia include no confusion, headaches, pallor or seizures. Pertinent negatives for diabetes include no blurred vision, no chest pain, no fatigue, no polydipsia, no polyphagia and no polyuria. There are no hypoglycemic complications. Symptoms are worsening. Diabetic complications include a CVA. (She is a chronic heavy smoker.) Risk factors for coronary artery disease include diabetes mellitus, dyslipidemia, hypertension, obesity, sedentary lifestyle and tobacco exposure. Current diabetic treatment includes oral agent (monotherapy). Her weight is fluctuating minimally.  She is following a generally unhealthy diet. When asked about meal planning, she reported none. She rarely participates in exercise. Her home blood glucose trend is decreasing steadily. Her breakfast blood glucose range is generally 110-130 mg/dl. Her bedtime blood glucose range is generally 130-140 mg/dl. Her overall blood glucose range is 130-140 mg/dl. (She presents with near target glycemic profile. Her point-of-care A1c 6.2%, improving from 7.5% during her last visit.   she did not document any major hypoglycemia.   ) An ACE inhibitor/angiotensin II receptor blocker is being taken. Eye exam is current.  Hyperlipidemia This is a chronic problem. The current episode started more than 1 year ago. The problem is controlled. Recent lipid tests were reviewed and are high. Exacerbating diseases include diabetes and obesity. Associated symptoms include shortness of breath. Pertinent negatives include no chest pain or myalgias. Current antihyperlipidemic treatment includes statins. Risk factors for coronary artery disease include diabetes mellitus, dyslipidemia, hypertension, a sedentary lifestyle, obesity and post-menopausal.  Hypertension This is a chronic  problem. The current episode started more than 1 year ago. The problem is controlled. Associated symptoms include shortness of breath. Pertinent negatives include no blurred vision, chest pain, headaches or palpitations. Risk factors for coronary artery disease include diabetes mellitus, dyslipidemia, sedentary lifestyle, smoking/tobacco exposure, post-menopausal state, family history and obesity. Past treatments include ACE inhibitors. Hypertensive end-organ damage includes CVA.    Review of systems  Limited as above.   Objective:    BP 136/82   Pulse 76   Ht 5\' 1"  (1.549 m)   Wt 213 lb 6.4 oz (96.8 kg)   BMI 40.32 kg/m   Wt Readings from Last 3 Encounters:  05/02/22 213 lb 6.4 oz (96.8 kg)  12/30/21 223 lb 12.8 oz (101.5 kg)  08/24/21 220 lb 6.4 oz (100 kg)     Physical Exam- Limited  Constitutional:  Body mass index is 40.32 kg/m. , not in acute distress, normal state of mind   CMP     Component Value Date/Time   NA 141 12/26/2021 0927   K 5.0 12/26/2021 0927   CL 98 12/26/2021 0927   CO2 28 12/26/2021 0927   GLUCOSE 198 (H) 12/26/2021 0927   GLUCOSE 160 (H) 02/11/2020 0727   BUN 16 12/26/2021 0927   CREATININE 0.84 12/26/2021 0927   CREATININE 0.89 02/11/2020 0727   CALCIUM 9.5 12/26/2021 0927   PROT 7.0 12/26/2021 0927   ALBUMIN 4.1 12/26/2021 0927   AST 20 12/26/2021 0927   ALT 17 12/26/2021 0927   ALKPHOS 80 12/26/2021 0927   BILITOT 0.4 12/26/2021 0927   GFRNONAA 68 02/11/2020 0727   GFRAA 79 02/11/2020 0727     Diabetic Labs (most recent): Lab Results  Component Value Date   HGBA1C 6.2 05/02/2022   HGBA1C 7.5 (A) 12/30/2021   HGBA1C 7.2 (A) 08/24/2021   MICROALBUR 30 05/26/2021   MICROALBUR 1.1 02/11/2020   MICROALBUR 22.8 11/27/2019   Lipid Panel     Component Value Date/Time   CHOL 119 05/09/2021 0811   TRIG 179 (H) 05/09/2021 0811   HDL 35 (L) 05/09/2021 0811   CHOLHDL 3.4 05/09/2021 0811   CHOLHDL 3.3 02/11/2020 0727   LDLCALC 54  05/09/2021 0811   LDLCALC 68 02/11/2020 0727      Assessment & Plan:   1. Uncontrolled type 2 diabetes mellitus with other circulatory complication, without long-term current use of insulin (HCC)   - Patient has currently improving  type 2 DM since  66 years of  age.  She presents with near target glycemic profile. Her point-of-care A1c 6.2%, improving from 7.5% during her last visit.   she did not document any major hypoglycemia.    - Recent labs reviewed.   Her diabetes is complicated by TIA, chronic heavy smoking and patient remains at a high risk for more acute and chronic complications of diabetes which include CAD, CVA, CKD, retinopathy, and neuropathy. These are all discussed in detail with the patient.  - I have counseled the patient on diet management and weight loss, by adopting a carbohydrate restricted/protein rich diet.  - she acknowledges that there is a room for improvement in her food and drink choices. - Suggestion is made for her to avoid simple carbohydrates  from her diet including Cakes, Sweet Desserts, Ice Cream, Soda (diet and regular), Sweet Tea, Candies, Chips, Cookies, Store Bought Juices, Alcohol , Artificial Sweeteners,  Coffee Creamer, and "Sugar-free" Products, Lemonade. This will help patient to have more stable blood glucose profile and potentially avoid unintended weight gain.  The following Lifestyle Medicine recommendations according to [place]  [facility]) were discussed and and offered to patient and she  agrees to start the journey:  A. Whole Foods, Plant-Based Nutrition comprising of fruits and vegetables, plant-based proteins, whole-grain carbohydrates was discussed in detail with the patient.   A list for source of those nutrients were also provided to the patient.  Patient will use only water or unsweetened tea for hydration. B.  The need to stay away from risky substances including alcohol, smoking; obtaining 7 to 9 hours  of restorative sleep, at least 150 minutes of moderate intensity exercise weekly, the importance of healthy social connections,  and stress management techniques were discussed. C.  A full color page of  Calorie density of various food groups per pound showing examples of each food groups was provided to the patient.    - I encouraged the patient to switch to  unprocessed or minimally processed complex starch and increased protein intake (animal or plant source), fruits, and vegetables.  - Patient is advised to stick to a routine mealtimes to eat 3 meals  a day and avoid unnecessary snacks ( to snack only to correct hypoglycemia).   - I have approached patient with the following individualized plan to manage diabetes and patient agrees:   She is benefiting from lifestyle medicine.  Based on her presentation with tightening glycemic profile, she will be considered for lower dose of insulin.  I discussed and lowered her Toujeo to 60 units nightly,  associated with monitoring of blood glucose twice a day-daily before breakfast and at bedtime.  Apparently, she has received a package of CGM.  She did not start using it.  She is advised to make a nurse visit appointment for demonstration and education  so she can use her CGM.   -In the meantime, she is advised to continue monitoring blood glucose at least twice a day-daily before breakfast and at bedtime.  -She is advised to continue metformin 850 mg by mouth twice a day, therapeutically suitable for patient. -She is advised to continue glipizide 5 mg XL p.o. daily at breakfast.  She is encouraged to call clinic for hypoglycemia below 70 or hyperglycemia greater than 200 mg per DL.  She will be considered off of glipizide during her next visit if she maintains her current control. - Patient specific target  A1c;  LDL, HDL, Triglycerides,  were discussed in detail. She is advised  to quit smoking, will be considered for low-dose GLP-1 receptor agonist  next visit.  2) BP/HTN:  -Her blood pressure is controlled to target.  She is advised to continue her current blood pressure medications including lisinopril 20 mg p.o. daily.    3) Lipids/HPL: Her recent lipid panel showed controlled LDL at 54. she is advised to continue atorvastatin 20 mg p.o. nightly.    Side effects and precautions discussed with her.    4)  Weight/Diet: Her BMI is 48.3-, progressively losing weight.   This still is clearly complicating her diabetes care.  She is a candidate for modest weight loss.  She did not have any further success in weight loss, she still admits significant  dietary indiscretion, CDE Consult  Is in progress  , exercise, and detailed carbohydrates information provided. Possible plant-based diet discussed above will help with her chronic comorbidities including obesity.  6) Chronic Care/Health Maintenance:  -Patient is on ACEI and Statin medications and encouraged to continue to follow up with Ophthalmology, Podiatrist at least yearly or according to recommendations, and advised to  quit smoking. I have recommended yearly flu vaccine and pneumonia vaccination at least every 5 years; moderate intensity exercise for up to 150 minutes weekly; and  sleep for at least 7 hours a day.  The patient was counseled on the dangers of tobacco use, and was advised to quit.  Reviewed strategies to maximize success, including removing cigarettes and smoking materials from environment.   She had normal ABI on September 01, 2020, this study will be repeated in March 2027 or sooner if needed.   - I advised patient to maintain close follow up with Burdine, Ananias PilgrimSteven E, MD for primary care needs.   I spent 41 minutes in the care of the patient today including review of labs from CMP, Lipids, Thyroid Function, Hematology (current and previous including abstractions from other facilities); face-to-face time discussing  her blood glucose readings/logs, discussing hypoglycemia and  hyperglycemia episodes and symptoms, medications doses, her options of short and long term treatment based on the latest standards of care / guidelines;  discussion about incorporating lifestyle medicine;  and documenting the encounter. Risk reduction counseling performed per USPSTF guidelines to reduce  obesity and cardiovascular risk factors.     Please refer to Patient Instructions for Blood Glucose Monitoring and Insulin/Medications Dosing Guide"  in media tab for additional information. Please  also refer to " Patient Self Inventory" in the Media  tab for reviewed elements of pertinent patient history.  Victoria Lara participated in the discussions, expressed understanding, and voiced agreement with the above plans.  All questions were answered to her satisfaction. she is encouraged to contact clinic should she have any questions or concerns prior to her return visit.   Follow up plan: - Return in about 4 months (around 08/31/2022) for F/U with Pre-visit Labs, Meter/CGM/Logs, A1c here.  Marquis LunchGebre Kodie Pick, MD Phone: [phone]  Fax: [phone]   -  This note was partially dictated with voice recognition software. Similar sounding words can be transcribed inadequately or may not  be corrected upon review.  05/02/2022, 2:41 PM

## 2022-05-02 NOTE — Patient Instructions (Signed)

## 2022-05-07 DIAGNOSIS — E1169 Type 2 diabetes mellitus with other specified complication: Secondary | ICD-10-CM | POA: Diagnosis not present

## 2022-05-08 ENCOUNTER — Other Ambulatory Visit: Payer: Self-pay | Admitting: "Endocrinology

## 2022-05-23 ENCOUNTER — Telehealth: Payer: Self-pay | Admitting: "Endocrinology

## 2022-05-23 DIAGNOSIS — E1169 Type 2 diabetes mellitus with other specified complication: Secondary | ICD-10-CM

## 2022-05-23 NOTE — Telephone Encounter (Signed)
Pt is stating that the pharmacy is telling she can not get anymore Toujeo until the 15th. She said that she only was given one box last time. Please Advise.

## 2022-05-24 MED ORDER — TOUJEO SOLOSTAR 300 UNIT/ML ~~LOC~~ SOPN: 60.0000 [IU] | PEN_INJECTOR | Freq: Every day | SUBCUTANEOUS | 0 refills | Status: DC

## 2022-05-24 NOTE — Telephone Encounter (Signed)
Pt's Rx refill for toujeo had noted to [facility] Later. Resent Rx refill for toujeo 60 units qhs to fill now to [facility]. Pt made aware.

## 2022-06-01 DIAGNOSIS — E1165 Type 2 diabetes mellitus with hyperglycemia: Secondary | ICD-10-CM | POA: Diagnosis not present

## 2022-06-01 DIAGNOSIS — Z1329 Encounter for screening for other suspected endocrine disorder: Secondary | ICD-10-CM | POA: Diagnosis not present

## 2022-06-01 DIAGNOSIS — J449 Chronic obstructive pulmonary disease, unspecified: Secondary | ICD-10-CM | POA: Diagnosis not present

## 2022-06-01 DIAGNOSIS — E875 Hyperkalemia: Secondary | ICD-10-CM | POA: Diagnosis not present

## 2022-06-01 DIAGNOSIS — I10 Essential (primary) hypertension: Secondary | ICD-10-CM | POA: Diagnosis not present

## 2022-06-01 DIAGNOSIS — E559 Vitamin D deficiency, unspecified: Secondary | ICD-10-CM | POA: Diagnosis not present

## 2022-06-02 DIAGNOSIS — Z1329 Encounter for screening for other suspected endocrine disorder: Secondary | ICD-10-CM | POA: Diagnosis not present

## 2022-06-02 DIAGNOSIS — E559 Vitamin D deficiency, unspecified: Secondary | ICD-10-CM | POA: Diagnosis not present

## 2022-06-02 DIAGNOSIS — E875 Hyperkalemia: Secondary | ICD-10-CM | POA: Diagnosis not present

## 2022-06-02 DIAGNOSIS — E1165 Type 2 diabetes mellitus with hyperglycemia: Secondary | ICD-10-CM | POA: Diagnosis not present

## 2022-06-02 DIAGNOSIS — I10 Essential (primary) hypertension: Secondary | ICD-10-CM | POA: Diagnosis not present

## 2022-06-06 DIAGNOSIS — E1169 Type 2 diabetes mellitus with other specified complication: Secondary | ICD-10-CM | POA: Diagnosis not present

## 2022-06-09 DIAGNOSIS — G4733 Obstructive sleep apnea (adult) (pediatric): Secondary | ICD-10-CM | POA: Diagnosis not present

## 2022-06-09 DIAGNOSIS — R030 Elevated blood-pressure reading, without diagnosis of hypertension: Secondary | ICD-10-CM | POA: Diagnosis not present

## 2022-06-09 DIAGNOSIS — J439 Emphysema, unspecified: Secondary | ICD-10-CM | POA: Diagnosis not present

## 2022-06-09 DIAGNOSIS — Z23 Encounter for immunization: Secondary | ICD-10-CM | POA: Diagnosis not present

## 2022-06-09 DIAGNOSIS — F17210 Nicotine dependence, cigarettes, uncomplicated: Secondary | ICD-10-CM | POA: Diagnosis not present

## 2022-06-09 DIAGNOSIS — I700 Atherosclerosis of aorta: Secondary | ICD-10-CM | POA: Diagnosis not present

## 2022-06-09 DIAGNOSIS — E1165 Type 2 diabetes mellitus with hyperglycemia: Secondary | ICD-10-CM | POA: Diagnosis not present

## 2022-06-09 DIAGNOSIS — J449 Chronic obstructive pulmonary disease, unspecified: Secondary | ICD-10-CM | POA: Diagnosis not present

## 2022-06-09 DIAGNOSIS — M48061 Spinal stenosis, lumbar region without neurogenic claudication: Secondary | ICD-10-CM | POA: Diagnosis not present

## 2022-06-13 ENCOUNTER — Other Ambulatory Visit: Payer: Self-pay | Admitting: "Endocrinology

## 2022-07-07 DIAGNOSIS — E1169 Type 2 diabetes mellitus with other specified complication: Secondary | ICD-10-CM | POA: Diagnosis not present

## 2022-07-19 ENCOUNTER — Other Ambulatory Visit: Payer: Self-pay | Admitting: "Endocrinology

## 2022-07-25 ENCOUNTER — Ambulatory Visit (INDEPENDENT_AMBULATORY_CARE_PROVIDER_SITE_OTHER): Payer: 59

## 2022-07-25 VITALS — BP 130/74 | Ht 61.0 in | Wt 213.0 lb

## 2022-07-25 DIAGNOSIS — E669 Obesity, unspecified: Secondary | ICD-10-CM | POA: Diagnosis not present

## 2022-07-25 DIAGNOSIS — E1169 Type 2 diabetes mellitus with other specified complication: Secondary | ICD-10-CM

## 2022-07-31 DIAGNOSIS — F17210 Nicotine dependence, cigarettes, uncomplicated: Secondary | ICD-10-CM | POA: Diagnosis not present

## 2022-07-31 DIAGNOSIS — M79672 Pain in left foot: Secondary | ICD-10-CM | POA: Diagnosis not present

## 2022-07-31 DIAGNOSIS — W19XXXA Unspecified fall, initial encounter: Secondary | ICD-10-CM | POA: Diagnosis not present

## 2022-07-31 DIAGNOSIS — S92353A Displaced fracture of fifth metatarsal bone, unspecified foot, initial encounter for closed fracture: Secondary | ICD-10-CM | POA: Diagnosis not present

## 2022-07-31 DIAGNOSIS — R030 Elevated blood-pressure reading, without diagnosis of hypertension: Secondary | ICD-10-CM | POA: Diagnosis not present

## 2022-08-02 NOTE — Patient Instructions (Signed)
Pt told to call the office with any questions/concerns

## 2022-08-02 NOTE — Progress Notes (Signed)
Pt brought in her Parnell 2 today. She has had this for quite a while but is now ready to try it. Went over usage and application instructions. Pt voices understanding. Libre sensor was placed on patients L arm. She was told to call with any questions or concerns.

## 2022-08-07 DIAGNOSIS — E1169 Type 2 diabetes mellitus with other specified complication: Secondary | ICD-10-CM | POA: Diagnosis not present

## 2022-08-07 DIAGNOSIS — S92352A Displaced fracture of fifth metatarsal bone, left foot, initial encounter for closed fracture: Secondary | ICD-10-CM | POA: Diagnosis not present

## 2022-08-16 ENCOUNTER — Other Ambulatory Visit: Payer: Self-pay | Admitting: "Endocrinology

## 2022-08-16 DIAGNOSIS — H02831 Dermatochalasis of right upper eyelid: Secondary | ICD-10-CM | POA: Diagnosis not present

## 2022-08-16 DIAGNOSIS — H16223 Keratoconjunctivitis sicca, not specified as Sjogren's, bilateral: Secondary | ICD-10-CM | POA: Diagnosis not present

## 2022-08-16 DIAGNOSIS — E119 Type 2 diabetes mellitus without complications: Secondary | ICD-10-CM | POA: Diagnosis not present

## 2022-08-16 DIAGNOSIS — Z961 Presence of intraocular lens: Secondary | ICD-10-CM | POA: Diagnosis not present

## 2022-08-16 DIAGNOSIS — Z7984 Long term (current) use of oral hypoglycemic drugs: Secondary | ICD-10-CM | POA: Diagnosis not present

## 2022-08-16 DIAGNOSIS — H02834 Dermatochalasis of left upper eyelid: Secondary | ICD-10-CM | POA: Diagnosis not present

## 2022-08-23 DIAGNOSIS — S92352D Displaced fracture of fifth metatarsal bone, left foot, subsequent encounter for fracture with routine healing: Secondary | ICD-10-CM | POA: Diagnosis not present

## 2022-08-23 DIAGNOSIS — H5213 Myopia, bilateral: Secondary | ICD-10-CM | POA: Diagnosis not present

## 2022-08-29 ENCOUNTER — Other Ambulatory Visit: Payer: Self-pay | Admitting: "Endocrinology

## 2022-08-29 DIAGNOSIS — E1169 Type 2 diabetes mellitus with other specified complication: Secondary | ICD-10-CM

## 2022-08-31 ENCOUNTER — Ambulatory Visit: Payer: 59 | Admitting: "Endocrinology

## 2022-09-01 DIAGNOSIS — R739 Hyperglycemia, unspecified: Secondary | ICD-10-CM | POA: Diagnosis not present

## 2022-09-05 DIAGNOSIS — E1169 Type 2 diabetes mellitus with other specified complication: Secondary | ICD-10-CM | POA: Diagnosis not present

## 2022-09-08 DIAGNOSIS — G4733 Obstructive sleep apnea (adult) (pediatric): Secondary | ICD-10-CM | POA: Diagnosis not present

## 2022-09-08 DIAGNOSIS — F17210 Nicotine dependence, cigarettes, uncomplicated: Secondary | ICD-10-CM | POA: Diagnosis not present

## 2022-09-08 DIAGNOSIS — M8580 Other specified disorders of bone density and structure, unspecified site: Secondary | ICD-10-CM | POA: Diagnosis not present

## 2022-09-08 DIAGNOSIS — E1169 Type 2 diabetes mellitus with other specified complication: Secondary | ICD-10-CM | POA: Diagnosis not present

## 2022-09-08 DIAGNOSIS — M48061 Spinal stenosis, lumbar region without neurogenic claudication: Secondary | ICD-10-CM | POA: Diagnosis not present

## 2022-09-08 DIAGNOSIS — J439 Emphysema, unspecified: Secondary | ICD-10-CM | POA: Diagnosis not present

## 2022-09-08 DIAGNOSIS — I700 Atherosclerosis of aorta: Secondary | ICD-10-CM | POA: Diagnosis not present

## 2022-09-08 DIAGNOSIS — J449 Chronic obstructive pulmonary disease, unspecified: Secondary | ICD-10-CM | POA: Diagnosis not present

## 2022-09-15 DIAGNOSIS — S92352D Displaced fracture of fifth metatarsal bone, left foot, subsequent encounter for fracture with routine healing: Secondary | ICD-10-CM | POA: Diagnosis not present

## 2022-09-27 DIAGNOSIS — M6281 Muscle weakness (generalized): Secondary | ICD-10-CM | POA: Diagnosis not present

## 2022-09-27 DIAGNOSIS — Z9181 History of falling: Secondary | ICD-10-CM | POA: Diagnosis not present

## 2022-09-27 DIAGNOSIS — E1169 Type 2 diabetes mellitus with other specified complication: Secondary | ICD-10-CM | POA: Diagnosis not present

## 2022-09-27 DIAGNOSIS — M79672 Pain in left foot: Secondary | ICD-10-CM | POA: Diagnosis not present

## 2022-09-28 LAB — COMPREHENSIVE METABOLIC PANEL
ALT: 13 IU/L (ref 0–32)
AST: 20 IU/L (ref 0–40)
Albumin/Globulin Ratio: 1.5 (ref 1.2–2.2)
Albumin: 4 g/dL (ref 3.9–4.9)
Alkaline Phosphatase: 72 IU/L (ref 44–121)
BUN/Creatinine Ratio: 19 (ref 12–28)
BUN: 16 mg/dL (ref 8–27)
Bilirubin Total: 0.5 mg/dL (ref 0.0–1.2)
CO2: 27 mmol/L (ref 20–29)
Calcium: 9.8 mg/dL (ref 8.7–10.3)
Chloride: 96 mmol/L (ref 96–106)
Creatinine, Ser: 0.83 mg/dL (ref 0.57–1.00)
Globulin, Total: 2.7 g/dL (ref 1.5–4.5)
Glucose: 84 mg/dL (ref 70–99)
Potassium: 4.5 mmol/L (ref 3.5–5.2)
Sodium: 140 mmol/L (ref 134–144)
Total Protein: 6.7 g/dL (ref 6.0–8.5)
eGFR: 77 mL/min/{1.73_m2} (ref >59)

## 2022-09-28 LAB — LIPID PANEL
Chol/HDL Ratio: 3.8 ratio (ref 0.0–4.4)
Cholesterol, Total: 135 mg/dL (ref 100–199)
HDL: 36 mg/dL — ABNORMAL LOW (ref >39)
LDL Chol Calc (NIH): 71 mg/dL (ref 0–99)
Triglycerides: 164 mg/dL — ABNORMAL HIGH (ref 0–149)
VLDL Cholesterol Cal: 28 mg/dL (ref 5–40)

## 2022-09-28 LAB — TSH: TSH: 3.96 u[IU]/mL (ref 0.450–4.500)

## 2022-09-28 LAB — T4, FREE: Free T4: 1.08 ng/dL (ref 0.82–1.77)

## 2022-10-04 ENCOUNTER — Encounter: Payer: Self-pay | Admitting: "Endocrinology

## 2022-10-04 ENCOUNTER — Ambulatory Visit (INDEPENDENT_AMBULATORY_CARE_PROVIDER_SITE_OTHER): Payer: 59 | Admitting: "Endocrinology

## 2022-10-04 VITALS — BP 120/68 | HR 84 | Ht 61.0 in | Wt 213.4 lb

## 2022-10-04 DIAGNOSIS — I10 Essential (primary) hypertension: Secondary | ICD-10-CM

## 2022-10-04 DIAGNOSIS — E119 Type 2 diabetes mellitus without complications: Secondary | ICD-10-CM | POA: Insufficient documentation

## 2022-10-04 DIAGNOSIS — E1169 Type 2 diabetes mellitus with other specified complication: Secondary | ICD-10-CM

## 2022-10-04 DIAGNOSIS — Z794 Long term (current) use of insulin: Secondary | ICD-10-CM | POA: Diagnosis not present

## 2022-10-04 DIAGNOSIS — F17200 Nicotine dependence, unspecified, uncomplicated: Secondary | ICD-10-CM | POA: Diagnosis not present

## 2022-10-04 DIAGNOSIS — E669 Obesity, unspecified: Secondary | ICD-10-CM

## 2022-10-04 DIAGNOSIS — E782 Mixed hyperlipidemia: Secondary | ICD-10-CM | POA: Diagnosis not present

## 2022-10-04 LAB — POCT GLYCOSYLATED HEMOGLOBIN (HGB A1C): HbA1c, POC (controlled diabetic range): 6.5 % (ref 0.0–7.0)

## 2022-10-04 MED ORDER — TOUJEO SOLOSTAR 300 UNIT/ML ~~LOC~~ SOPN: 50.0000 [IU] | PEN_INJECTOR | Freq: Every evening | SUBCUTANEOUS | 1 refills | Status: DC

## 2022-10-04 NOTE — Progress Notes (Signed)
10/04/2022   Endocrinology follow-up note   Subjective:    Patient ID: Victoria Lara, female    DOB: November 30, 1955.  She is here in follow-up for the management of symptomatic type 2 diabetes, hyperlipidemia, hypertension.    PMD:   Juliette Alcide, MD  Past Medical History:  Diagnosis Date   Anemia    Anxiety    Arthritis    Bronchitis    Complication of anesthesia    had to come back to ER after shoulder surgery at [facility]; hard to wake up; low O2 sats   Depression    Diabetes mellitus without complication    GERD (gastroesophageal reflux disease)    Hyperlipidemia    Hypertension    Neuropathy    Takes Gabapentin   Pneumonia 2016   Restless leg syndrome    Seasonal allergies    Shortness of breath dyspnea    Sleep apnea    Syncope    TIA (transient ischemic attack) 2006   Past Surgical History:  Procedure Laterality Date   BREAST LUMPECTOMY Bilateral    CATARACT EXTRACTION W/PHACO Right 08/20/2014   Procedure: CATARACT EXTRACTION PHACO AND INTRAOCULAR LENS PLACEMENT (IOC);  Surgeon: Gemma Payor, MD;  Location: [facility];  Service: Ophthalmology;  Laterality: Right;  CDE:5.31   CATARACT EXTRACTION W/PHACO Left 01/19/2020   Procedure: CATARACT EXTRACTION PHACO AND INTRAOCULAR LENS PLACEMENT (IOC);  Surgeon: Fabio Pierce, MD;  Location: [facility];  Service: Ophthalmology;  Laterality: Left;  CDE: 12.32   HAND SURGERY Bilateral    NASAL SEPTOPLASTY W/ TURBINOPLASTY Bilateral 01/26/2016   Procedure: NASAL SEPTOPLASTY WITH BILATERAL TURBINATE REDUCTION;  Surgeon: Newman Pies, MD;  Location: [facility];  Service: ENT;  Laterality: Bilateral;   NASAL SEPTUM SURGERY  01/26/2016   NASAL SINUS SURGERY Bilateral 01/26/2016   Procedure: BILATERAL ENDOSCOPIC MAXILLARY ANTROSTOMY;  Surgeon: Newman Pies, MD;  Location: [facility];  Service: ENT;  Laterality: Bilateral;   SHOULDER ARTHROSCOPY WITH ROTATOR CUFF REPAIR Right    TUBAL LIGATION     Social History   Socioeconomic History   Marital status:  Widowed    Spouse name: Not on file   Number of children: 1   Years of education: 24   Highest education level: Not on file  Occupational History   Occupation: Unemployed  Tobacco Use   Smoking status: Some Days    Packs/day: 0.10    Years: 44.00    Additional pack years: 0.00    Total pack years: 4.40    Types: Cigarettes   Smokeless tobacco: Never  Vaping Use   Vaping Use: Never used  Substance and Sexual Activity   Alcohol use: No    Alcohol/week: 0.0 standard drinks of alcohol   Drug use: No   Sexual activity: Not on file  Other Topics Concern   Not on file  Social History Narrative   Lives with boyfriend   Caffeine use: Drinks 1 soda a day    Social Determinants of Health   Financial Resource Strain: Not on file  Food Insecurity: Not on file  Transportation Needs: Not on file  Physical Activity: Not on file  Stress: Not on file  Social Connections: Not on file   Outpatient Encounter Medications as of 10/04/2022  Medication Sig   ACCU-CHEK AVIVA PLUS test strip USE TWICE DAILY TO CHECK SUGAR   ACCU-CHEK GUIDE test strip USE ONE STRIP TO TEST BLOOD SUGAR TWICE DAILY   Accu-Chek Softclix Lancets lancets USE TO CHECK BLOOD SUGAR  TWICE DAILY   acetaminophen (TYLENOL) 500 MG tablet Take 1,500 mg by mouth 2 (two) times daily as needed for moderate pain or headache.   albuterol (PROVENTIL) (2.5 MG/3ML) 0.083% nebulizer solution Take 3 mLs (2.5 mg total) by nebulization every 6 (six) hours as needed for wheezing.   aspirin EC 81 MG tablet Take 81 mg by mouth daily.   atorvastatin (LIPITOR) 20 MG tablet TAKE ONE TABLET BY MOUTH DAILY (Patient taking differently: Take 20 mg by mouth daily. )   Blood Glucose Monitoring Suppl (ACCU-CHEK AVIVA) device Use as instructed   buPROPion (WELLBUTRIN SR) 150 MG 12 hr tablet Take 150 mg by mouth 2 (two) times daily.   cetirizine (ZYRTEC) 10 MG tablet Take 10 mg by mouth daily.   citalopram (CELEXA) 20 MG tablet Take 40 mg by mouth  daily.   clonazePAM (KLONOPIN) 0.5 MG tablet Take 0.5 mg by mouth every 8 (eight) hours as needed for anxiety.    Continuous Blood Gluc Receiver (FREESTYLE LIBRE 2 READER) DEVI As directed   Continuous Blood Gluc Sensor (FREESTYLE LIBRE 2 SENSOR) MISC 1 Piece by Does not apply route every 14 (fourteen) days.   EASY COMFORT PEN NEEDLES 31G X 6 MM MISC USE AS DIRECTED ONCE DAILY.   fluticasone (FLONASE) 50 MCG/ACT nasal spray Place 1 spray into both nostrils daily.   gabapentin (NEURONTIN) 300 MG capsule Take 300 mg by mouth 2 (two) times daily.   glucose blood (ACCU-CHEK AVIVA) test strip To test 4 times a day   hydrochlorothiazide (HYDRODIURIL) 25 MG tablet Take 25 mg by mouth daily.   insulin glargine, 1 Unit Dial, (TOUJEO SOLOSTAR) 300 UNIT/ML Solostar Pen Inject 50 Units into the skin at bedtime.   metFORMIN (GLUCOPHAGE) 850 MG tablet Take 850 mg by mouth 2 (two) times daily with a meal.   montelukast (SINGULAIR) 10 MG tablet Take 10 mg by mouth at bedtime.   omeprazole (PRILOSEC) 20 MG capsule Take 20 mg by mouth daily.   PROAIR HFA 108 (90 Base) MCG/ACT inhaler Inhale 2 puffs into the lungs every 4 (four) hours as needed for wheezing or shortness of breath.    rOPINIRole (REQUIP) 0.25 MG tablet Take 0.25 mg by mouth See admin instructions. Take 0.25 mg at night may take a second 0.25 mg dose as needed for restless legs   TRELEGY ELLIPTA 100-62.5-25 MCG/ACT AEPB Inhale 1 puff into the lungs daily.   valsartan (DIOVAN) 160 MG tablet Take 160 mg by mouth daily.   [DISCONTINUED] glipiZIDE (GLUCOTROL XL) 5 MG 24 hr tablet TAKE ONE TABLET BY MOUTH DAILY WITH BREAKFAST   [DISCONTINUED] TOUJEO SOLOSTAR 300 UNIT/ML Solostar Pen INJECT 60 UNITS SUBCUTANEOUSLY AT BEDTIME   No facility-administered encounter medications on file as of 10/04/2022.   ALLERGIES: Allergies  Allergen Reactions   Lisinopril Cough   VACCINATION STATUS: Immunization History  Administered Date(s) Administered    Influenza,inj,Quad PF,6+ Mos 03/26/2018    Diabetes She presents for her follow-up diabetic visit. She has type 2 diabetes mellitus. Onset time: She was diagnosed at approximate age of 67 years. Her disease course has been stable. There are no hypoglycemic associated symptoms. Pertinent negatives for hypoglycemia include no confusion, headaches, pallor or seizures. Pertinent negatives for diabetes include no blurred vision, no chest pain, no fatigue, no polydipsia, no polyphagia and no polyuria. There are no hypoglycemic complications. Symptoms are worsening. Diabetic complications include a CVA. (She is a chronic heavy smoker.) Risk factors for coronary artery disease include diabetes mellitus,  dyslipidemia, hypertension, obesity, sedentary lifestyle and tobacco exposure. Current diabetic treatment includes oral agent (monotherapy). Her weight is stable. She is following a generally unhealthy diet. When asked about meal planning, she reported none. She rarely participates in exercise. Her home blood glucose trend is decreasing steadily. Her breakfast blood glucose range is generally 110-130 mg/dl. Her bedtime blood glucose range is generally 130-140 mg/dl. Her overall blood glucose range is 130-140 mg/dl. (She presents with with her CGM showing controlled glycemic profile averaging 104 for the last 14 days.  Her AGP report shows 85% time range, 3% level 1 hyperglycemia she has 10% level 1 hypoglycemia.  ) An ACE inhibitor/angiotensin II receptor blocker is being taken. Eye exam is current.  Hyperlipidemia This is a chronic problem. The current episode started more than 1 year ago. The problem is controlled. Recent lipid tests were reviewed and are high. Exacerbating diseases include diabetes and obesity. Associated symptoms include shortness of breath. Pertinent negatives include no chest pain or myalgias. Current antihyperlipidemic treatment includes statins. Risk factors for coronary artery disease include  diabetes mellitus, dyslipidemia, hypertension, a sedentary lifestyle, obesity and post-menopausal.  Hypertension This is a chronic problem. The current episode started more than 1 year ago. The problem is controlled. Associated symptoms include shortness of breath. Pertinent negatives include no blurred vision, chest pain, headaches or palpitations. Risk factors for coronary artery disease include diabetes mellitus, dyslipidemia, sedentary lifestyle, smoking/tobacco exposure, post-menopausal state, family history and obesity. Past treatments include ACE inhibitors. Hypertensive end-organ damage includes CVA.    Review of systems  Limited as above.   Objective:    BP 120/68 Comment: R arm with manuel cuff  Pulse 84   Ht 5\' 1"  (1.549 m)   Wt 213 lb 6.4 oz (96.8 kg)   BMI 40.32 kg/m   Wt Readings from Last 3 Encounters:  10/04/22 213 lb 6.4 oz (96.8 kg)  07/25/22 213 lb (96.6 kg)  05/02/22 213 lb 6.4 oz (96.8 kg)     Physical Exam- Limited  Constitutional:  Body mass index is 40.32 kg/m. , not in acute distress, normal state of mind   CMP     Component Value Date/Time   NA 140 09/27/2022 0846   K 4.5 09/27/2022 0846   CL 96 09/27/2022 0846   CO2 27 09/27/2022 0846   GLUCOSE 84 09/27/2022 0846   GLUCOSE 160 (H) 02/11/2020 0727   BUN 16 09/27/2022 0846   CREATININE 0.83 09/27/2022 0846   CREATININE 0.89 02/11/2020 0727   CALCIUM 9.8 09/27/2022 0846   PROT 6.7 09/27/2022 0846   ALBUMIN 4.0 09/27/2022 0846   AST 20 09/27/2022 0846   ALT 13 09/27/2022 0846   ALKPHOS 72 09/27/2022 0846   BILITOT 0.5 09/27/2022 0846   GFRNONAA 68 02/11/2020 0727   GFRAA 79 02/11/2020 0727     Diabetic Labs (most recent): Lab Results  Component Value Date   HGBA1C 6.5 10/04/2022   HGBA1C 6.2 05/02/2022   HGBA1C 7.5 (A) 12/30/2021   MICROALBUR 30 05/26/2021   MICROALBUR 1.1 02/11/2020   MICROALBUR 22.8 11/27/2019   Lipid Panel     Component Value Date/Time   CHOL 135 09/27/2022  0846   TRIG 164 (H) 09/27/2022 0846   HDL 36 (L) 09/27/2022 0846   CHOLHDL 3.8 09/27/2022 0846   CHOLHDL 3.3 02/11/2020 0727   LDLCALC 71 09/27/2022 0846   LDLCALC 68 02/11/2020 0727      Assessment & Plan:   1. Uncontrolled type 2 diabetes mellitus  with other circulatory complication, without long-term current use of insulin (HCC)   - Patient has currently improving  type 2 DM since  67 years of age.  She presents with with her CGM showing controlled glycemic profile averaging 104 for the last 14 days.  Her AGP report shows 85% time range, 3% level 1 hyperglycemia she has 10% level 1 hypoglycemia.    - Recent labs reviewed.   Her diabetes is complicated by TIA, chronic heavy smoking and patient remains at a high risk for more acute and chronic complications of diabetes which include CAD, CVA, CKD, retinopathy, and neuropathy. These are all discussed in detail with the patient.  - she acknowledges that there is a room for improvement in her food and drink choices. - Suggestion is made for her to avoid simple carbohydrates  from her diet including Cakes, Sweet Desserts, Ice Cream, Soda (diet and regular), Sweet Tea, Candies, Chips, Cookies, Store Bought Juices, Alcohol , Artificial Sweeteners,  Coffee Creamer, and "Sugar-free" Products, Lemonade. This will help patient to have more stable blood glucose profile and potentially avoid unintended weight gain.  The following Lifestyle Medicine recommendations according to American College of Lifestyle Medicine  [facility]) were discussed and and offered to patient and she  agrees to start the journey:  A. Whole Foods, Plant-Based Nutrition comprising of fruits and vegetables, plant-based proteins, whole-grain carbohydrates was discussed in detail with the patient.   A list for source of those nutrients were also provided to the patient.  Patient will use only water or unsweetened tea for hydration. B.  The need to stay away from risky substances  including alcohol, smoking; obtaining 7 to 9 hours of restorative sleep, at least 150 minutes of moderate intensity exercise weekly, the importance of healthy social connections,  and stress management techniques were discussed. C.  A full color page of  Calorie density of various food groups per pound showing examples of each food groups was provided to the patient.   - I have approached patient with the following individualized plan to manage diabetes and patient agrees:   She is benefiting from lifestyle medicine.  Based on her presentation with tightening glycemic profile, she will be considered for lower dose of insulin.   I discussed and lowered her Toujeo to 50 units nightly, advised to continue to use her CGM continuously.     -She is advised to continue metformin 850 mg by mouth twice a day, therapeutically suitable for patient. -She is advised to discontinue glipizide for now.   She is encouraged to call clinic for hypoglycemia below 70 or hyperglycemia greater than 200 mg per DL.    - Patient specific target  A1c;  LDL, HDL, Triglycerides,  were discussed in detail. She is advised to quit smoking, will be considered for low-dose GLP-1 receptor agonist next visit.  2) BP/HTN:  -Her blood pressure is controlled to target.  She is advised to continue her current blood pressure medications including lisinopril 20 mg p.o. daily.    3) Lipids/HPL: Her recent lipid panel showed controlled LDL at 54. she is advised to continue atorvastatin 20 mg p.o. nightly.    Side effects and precautions discussed with her.    4)  Weight/Diet: Her BMI is 40.32- progressively losing weight.   This still is clearly complicating her diabetes care.  She is a candidate for modest weight loss.  She did not have any further success in weight loss, she still admits significant  dietary indiscretion,  CDE Consult  Is in progress  , exercise, and detailed carbohydrates information provided. Possible plant-based  diet discussed above will help with her chronic comorbidities including obesity.  6) Chronic Care/Health Maintenance:  -Patient is on ACEI and Statin medications and encouraged to continue to follow up with Ophthalmology, Podiatrist at least yearly or according to recommendations, and advised to  quit smoking. I have recommended yearly flu vaccine and pneumonia vaccination at least every 5 years; moderate intensity exercise for up to 150 minutes weekly; and  sleep for at least 7 hours a day.  The patient was counseled on the dangers of tobacco use, and was advised to quit.  Reviewed strategies to maximize success, including removing cigarettes and smoking materials from environment.   She had normal ABI on September 01, 2020, this study will be repeated in March 2027 or sooner if needed.   - I advised patient to maintain close follow up with Burdine, Ananias Pilgrim, MD for primary care needs.   I spent  26  minutes in the care of the patient today including review of labs from CMP, Lipids, Thyroid Function, Hematology (current and previous including abstractions from other facilities); face-to-face time discussing  her blood glucose readings/logs, discussing hypoglycemia and hyperglycemia episodes and symptoms, medications doses, her options of short and long term treatment based on the latest standards of care / guidelines;  discussion about incorporating lifestyle medicine;  and documenting the encounter. Risk reduction counseling performed per USPSTF guidelines to reduce  obesity and cardiovascular risk factors.     Please refer to Patient Instructions for Blood Glucose Monitoring and Insulin/Medications Dosing Guide"  in media tab for additional information. Please  also refer to " Patient Self Inventory" in the Media  tab for reviewed elements of pertinent patient history.  Victoria Lara participated in the discussions, expressed understanding, and voiced agreement with the above plans.  All  questions were answered to her satisfaction. she is encouraged to contact clinic should she have any questions or concerns prior to her return visit.   Follow up plan: - Return in about 3 months (around 01/03/2023) for Bring Meter/CGM Device/Logs- A1c in Office.  Marquis Lunch, MD Phone: [phone]  Fax: [phone]   -  This note was partially dictated with voice recognition software. Similar sounding words can be transcribed inadequately or may not  be corrected upon review.  10/04/2022, 2:05 PM

## 2022-10-04 NOTE — Patient Instructions (Signed)
                                     Advice for Weight Management  -For most of us the best way to lose weight is by diet management. Generally speaking, diet management means consuming less calories intentionally which over time brings about progressive weight loss.  This can be achieved more effectively by avoiding ultra processed carbohydrates, processed meats, unhealthy fats.    It is critically important to know your numbers: how much calorie you are consuming and how much calorie you need. More importantly, our carbohydrates sources should be unprocessed naturally occurring  complex starch food items.  It is always important to balance nutrition also by  appropriate intake of proteins (mainly plant-based), healthy fats/oils, plenty of fruits and vegetables.   -The American College of Lifestyle Medicine (ACL M) recommends nutrition derived mostly from Whole Food, Plant Predominant Sources example an apple instead of applesauce or apple pie. Eat Plenty of vegetables, Mushrooms, fruits, Legumes, Whole Grains, Nuts, seeds in lieu of processed meats, processed snacks/pastries red meat, poultry, eggs.  Use only water or unsweetened tea for hydration.  The College also recommends the need to stay away from risky substances including alcohol, smoking; obtaining 7-9 hours of restorative sleep, at least 150 minutes of moderate intensity exercise weekly, importance of healthy social connections, and being mindful of stress and seek help when it is overwhelming.    -Sticking to a routine mealtime to eat 3 meals a day and avoiding unnecessary snacks is shown to have a big role in weight control. Under normal circumstances, the only time we burn stored energy is when we are hungry, so allow  some hunger to take place- hunger means no food between appropriate meal times, only water.  It is not advisable to starve.   -It is better to avoid simple carbohydrates including:  Cakes, Sweet Desserts, Ice Cream, Soda (diet and regular), Sweet Tea, Candies, Chips, Cookies, Store Bought Juices, Alcohol in Excess of  1-2 drinks a day, Lemonade,  Artificial Sweeteners, Doughnuts, Coffee Creamers, "Sugar-free" Products, etc, etc.  This is not a complete list.....    -Consulting with certified diabetes educators is proven to provide you with the most accurate and current information on diet.  Also, you may be  interested in discussing diet options/exchanges , we can schedule a visit with Victoria Lara, RDN, CDE for individualized nutrition education.  -Exercise: If you are able: 30 -60 minutes a day ,4 days a week, or 150 minutes of moderate intensity exercise weekly.    The longer the better if tolerated.  Combine stretch, strength, and aerobic activities.  If you were told in the past that you have high risk for cardiovascular diseases, or if you are currently symptomatic, you may seek evaluation by your heart doctor prior to initiating moderate to intense exercise programs.                                  Additional Care Considerations for Diabetes/Prediabetes   -Diabetes  is a chronic disease.  The most important care consideration is regular follow-up with your diabetes care provider with the goal being avoiding or delaying its complications and to take advantage of advances in medications and technology.  If appropriate actions are taken early enough, type 2 diabetes can even be   reversed.  Seek information from the right source.  - Whole Food, Plant Predominant Nutrition is highly recommended: Eat Plenty of vegetables, Mushrooms, fruits, Legumes, Whole Grains, Nuts, seeds in lieu of processed meats, processed snacks/pastries red meat, poultry, eggs as recommended by American College of  Lifestyle Medicine (ACLM).  -Type 2 diabetes is known to coexist with other important comorbidities such as high blood pressure and high cholesterol.  It is critical to control not only the  diabetes but also the high blood pressure and high cholesterol to minimize and delay the risk of complications including coronary artery disease, stroke, amputations, blindness, etc.  The good news is that this diet recommendation for type 2 diabetes is also very helpful for managing high cholesterol and high blood blood pressure.  - Studies showed that people with diabetes will benefit from a class of medications known as ACE inhibitors and statins.  Unless there are specific reasons not to be on these medications, the standard of care is to consider getting one from these groups of medications at an optimal doses.  These medications are generally considered safe and proven to help protect the heart and the kidneys.    - People with diabetes are encouraged to initiate and maintain regular follow-up with eye doctors, foot doctors, dentists , and if necessary heart and kidney doctors.     - It is highly recommended that people with diabetes quit smoking or stay away from smoking, and get yearly  flu vaccine and pneumonia vaccine at least every 5 years.  See above for additional recommendations on exercise, sleep, stress management , and healthy social connections.      

## 2022-10-06 DIAGNOSIS — E1169 Type 2 diabetes mellitus with other specified complication: Secondary | ICD-10-CM | POA: Diagnosis not present

## 2022-10-11 DIAGNOSIS — Z9181 History of falling: Secondary | ICD-10-CM | POA: Diagnosis not present

## 2022-10-11 DIAGNOSIS — M6281 Muscle weakness (generalized): Secondary | ICD-10-CM | POA: Diagnosis not present

## 2022-10-11 DIAGNOSIS — M79672 Pain in left foot: Secondary | ICD-10-CM | POA: Diagnosis not present

## 2022-10-13 DIAGNOSIS — S92352D Displaced fracture of fifth metatarsal bone, left foot, subsequent encounter for fracture with routine healing: Secondary | ICD-10-CM | POA: Diagnosis not present

## 2022-10-13 DIAGNOSIS — H52223 Regular astigmatism, bilateral: Secondary | ICD-10-CM | POA: Diagnosis not present

## 2022-10-13 DIAGNOSIS — H5203 Hypermetropia, bilateral: Secondary | ICD-10-CM | POA: Diagnosis not present

## 2022-10-17 DIAGNOSIS — Z9181 History of falling: Secondary | ICD-10-CM | POA: Diagnosis not present

## 2022-10-17 DIAGNOSIS — M79672 Pain in left foot: Secondary | ICD-10-CM | POA: Diagnosis not present

## 2022-10-17 DIAGNOSIS — M6281 Muscle weakness (generalized): Secondary | ICD-10-CM | POA: Diagnosis not present

## 2022-11-05 DIAGNOSIS — E1169 Type 2 diabetes mellitus with other specified complication: Secondary | ICD-10-CM | POA: Diagnosis not present

## 2022-11-10 ENCOUNTER — Other Ambulatory Visit: Payer: Self-pay | Admitting: "Endocrinology

## 2022-11-10 DIAGNOSIS — E669 Obesity, unspecified: Secondary | ICD-10-CM

## 2022-12-06 DIAGNOSIS — E1169 Type 2 diabetes mellitus with other specified complication: Secondary | ICD-10-CM | POA: Diagnosis not present

## 2022-12-08 DIAGNOSIS — E875 Hyperkalemia: Secondary | ICD-10-CM | POA: Diagnosis not present

## 2022-12-08 DIAGNOSIS — Z720 Tobacco use: Secondary | ICD-10-CM | POA: Diagnosis not present

## 2022-12-08 DIAGNOSIS — E1169 Type 2 diabetes mellitus with other specified complication: Secondary | ICD-10-CM | POA: Diagnosis not present

## 2022-12-08 DIAGNOSIS — E7800 Pure hypercholesterolemia, unspecified: Secondary | ICD-10-CM | POA: Diagnosis not present

## 2022-12-08 DIAGNOSIS — Z1329 Encounter for screening for other suspected endocrine disorder: Secondary | ICD-10-CM | POA: Diagnosis not present

## 2022-12-14 DIAGNOSIS — E1169 Type 2 diabetes mellitus with other specified complication: Secondary | ICD-10-CM | POA: Diagnosis not present

## 2022-12-14 DIAGNOSIS — R030 Elevated blood-pressure reading, without diagnosis of hypertension: Secondary | ICD-10-CM | POA: Diagnosis not present

## 2022-12-14 DIAGNOSIS — Z0001 Encounter for general adult medical examination with abnormal findings: Secondary | ICD-10-CM | POA: Diagnosis not present

## 2022-12-14 DIAGNOSIS — J449 Chronic obstructive pulmonary disease, unspecified: Secondary | ICD-10-CM | POA: Diagnosis not present

## 2022-12-14 DIAGNOSIS — G4733 Obstructive sleep apnea (adult) (pediatric): Secondary | ICD-10-CM | POA: Diagnosis not present

## 2022-12-14 DIAGNOSIS — Z1231 Encounter for screening mammogram for malignant neoplasm of breast: Secondary | ICD-10-CM | POA: Diagnosis not present

## 2022-12-14 DIAGNOSIS — I700 Atherosclerosis of aorta: Secondary | ICD-10-CM | POA: Diagnosis not present

## 2022-12-14 DIAGNOSIS — J439 Emphysema, unspecified: Secondary | ICD-10-CM | POA: Diagnosis not present

## 2022-12-14 DIAGNOSIS — Z720 Tobacco use: Secondary | ICD-10-CM | POA: Diagnosis not present

## 2022-12-14 DIAGNOSIS — N62 Hypertrophy of breast: Secondary | ICD-10-CM | POA: Diagnosis not present

## 2022-12-14 DIAGNOSIS — M48061 Spinal stenosis, lumbar region without neurogenic claudication: Secondary | ICD-10-CM | POA: Diagnosis not present

## 2022-12-19 ENCOUNTER — Other Ambulatory Visit: Payer: Self-pay

## 2022-12-19 DIAGNOSIS — E1169 Type 2 diabetes mellitus with other specified complication: Secondary | ICD-10-CM

## 2022-12-19 DIAGNOSIS — R531 Weakness: Secondary | ICD-10-CM | POA: Diagnosis not present

## 2022-12-19 DIAGNOSIS — M48062 Spinal stenosis, lumbar region with neurogenic claudication: Secondary | ICD-10-CM | POA: Diagnosis not present

## 2022-12-19 MED ORDER — TOUJEO SOLOSTAR 300 UNIT/ML ~~LOC~~ SOPN
50.0000 [IU] | PEN_INJECTOR | Freq: Every day | SUBCUTANEOUS | 1 refills | Status: DC
Start: 2022-12-19 — End: 2023-03-13

## 2022-12-22 DIAGNOSIS — R531 Weakness: Secondary | ICD-10-CM | POA: Diagnosis not present

## 2022-12-22 DIAGNOSIS — M48062 Spinal stenosis, lumbar region with neurogenic claudication: Secondary | ICD-10-CM | POA: Diagnosis not present

## 2022-12-26 DIAGNOSIS — M48062 Spinal stenosis, lumbar region with neurogenic claudication: Secondary | ICD-10-CM | POA: Diagnosis not present

## 2022-12-26 DIAGNOSIS — R531 Weakness: Secondary | ICD-10-CM | POA: Diagnosis not present

## 2023-01-03 DIAGNOSIS — S43102A Unspecified dislocation of left acromioclavicular joint, initial encounter: Secondary | ICD-10-CM | POA: Diagnosis not present

## 2023-01-03 DIAGNOSIS — R030 Elevated blood-pressure reading, without diagnosis of hypertension: Secondary | ICD-10-CM | POA: Diagnosis not present

## 2023-01-03 DIAGNOSIS — M25811 Other specified joint disorders, right shoulder: Secondary | ICD-10-CM | POA: Diagnosis not present

## 2023-01-03 DIAGNOSIS — M779 Enthesopathy, unspecified: Secondary | ICD-10-CM | POA: Diagnosis not present

## 2023-01-03 DIAGNOSIS — E1169 Type 2 diabetes mellitus with other specified complication: Secondary | ICD-10-CM | POA: Diagnosis not present

## 2023-01-03 DIAGNOSIS — S43422A Sprain of left rotator cuff capsule, initial encounter: Secondary | ICD-10-CM | POA: Diagnosis not present

## 2023-01-03 DIAGNOSIS — M48061 Spinal stenosis, lumbar region without neurogenic claudication: Secondary | ICD-10-CM | POA: Diagnosis not present

## 2023-01-03 DIAGNOSIS — S46012A Strain of muscle(s) and tendon(s) of the rotator cuff of left shoulder, initial encounter: Secondary | ICD-10-CM | POA: Diagnosis not present

## 2023-01-03 DIAGNOSIS — M25512 Pain in left shoulder: Secondary | ICD-10-CM | POA: Diagnosis not present

## 2023-01-03 DIAGNOSIS — S43421A Sprain of right rotator cuff capsule, initial encounter: Secondary | ICD-10-CM | POA: Diagnosis not present

## 2023-01-05 DIAGNOSIS — E1169 Type 2 diabetes mellitus with other specified complication: Secondary | ICD-10-CM | POA: Diagnosis not present

## 2023-01-05 DIAGNOSIS — M48062 Spinal stenosis, lumbar region with neurogenic claudication: Secondary | ICD-10-CM | POA: Diagnosis not present

## 2023-01-05 DIAGNOSIS — R531 Weakness: Secondary | ICD-10-CM | POA: Diagnosis not present

## 2023-01-08 DIAGNOSIS — R531 Weakness: Secondary | ICD-10-CM | POA: Diagnosis not present

## 2023-01-08 DIAGNOSIS — M48062 Spinal stenosis, lumbar region with neurogenic claudication: Secondary | ICD-10-CM | POA: Diagnosis not present

## 2023-01-11 ENCOUNTER — Ambulatory Visit: Payer: 59 | Admitting: "Endocrinology

## 2023-01-16 DIAGNOSIS — M48062 Spinal stenosis, lumbar region with neurogenic claudication: Secondary | ICD-10-CM | POA: Diagnosis not present

## 2023-01-16 DIAGNOSIS — M25512 Pain in left shoulder: Secondary | ICD-10-CM | POA: Diagnosis not present

## 2023-01-16 DIAGNOSIS — R531 Weakness: Secondary | ICD-10-CM | POA: Diagnosis not present

## 2023-01-18 DIAGNOSIS — M25512 Pain in left shoulder: Secondary | ICD-10-CM | POA: Diagnosis not present

## 2023-01-18 DIAGNOSIS — R531 Weakness: Secondary | ICD-10-CM | POA: Diagnosis not present

## 2023-01-18 DIAGNOSIS — M48062 Spinal stenosis, lumbar region with neurogenic claudication: Secondary | ICD-10-CM | POA: Diagnosis not present

## 2023-02-01 DIAGNOSIS — R531 Weakness: Secondary | ICD-10-CM | POA: Diagnosis not present

## 2023-02-01 DIAGNOSIS — M25512 Pain in left shoulder: Secondary | ICD-10-CM | POA: Diagnosis not present

## 2023-02-01 DIAGNOSIS — M48062 Spinal stenosis, lumbar region with neurogenic claudication: Secondary | ICD-10-CM | POA: Diagnosis not present

## 2023-02-05 DIAGNOSIS — E1169 Type 2 diabetes mellitus with other specified complication: Secondary | ICD-10-CM | POA: Diagnosis not present

## 2023-02-08 DIAGNOSIS — M48062 Spinal stenosis, lumbar region with neurogenic claudication: Secondary | ICD-10-CM | POA: Diagnosis not present

## 2023-02-08 DIAGNOSIS — M25512 Pain in left shoulder: Secondary | ICD-10-CM | POA: Diagnosis not present

## 2023-02-08 DIAGNOSIS — R531 Weakness: Secondary | ICD-10-CM | POA: Diagnosis not present

## 2023-02-13 ENCOUNTER — Telehealth: Payer: Self-pay | Admitting: Nurse Practitioner

## 2023-02-13 NOTE — Telephone Encounter (Signed)
Will contact the [facility] rep to see if we can get her a new Libre 2 reader.

## 2023-02-13 NOTE — Telephone Encounter (Signed)
Pt said her Victoria Lara is not working anymore, she said it just will not cut on even after charging it.

## 2023-02-13 NOTE — Telephone Encounter (Signed)
Per Josephine Igo Rep pt needs to call [phone] and let them know that her receiver has quit holding a charge. [employer] should at that point send her a new receiver. Pt was notified and she will call us back if she calls and still doesn't find out anything.

## 2023-02-15 DIAGNOSIS — M25512 Pain in left shoulder: Secondary | ICD-10-CM | POA: Diagnosis not present

## 2023-02-15 DIAGNOSIS — M48062 Spinal stenosis, lumbar region with neurogenic claudication: Secondary | ICD-10-CM | POA: Diagnosis not present

## 2023-02-15 DIAGNOSIS — R531 Weakness: Secondary | ICD-10-CM | POA: Diagnosis not present

## 2023-02-16 ENCOUNTER — Telehealth: Payer: Self-pay | Admitting: "Endocrinology

## 2023-02-16 NOTE — Telephone Encounter (Signed)
  FYI Pt was given the # to call and get a new Libre 2 receiver. Hers has quit charging completely and she is unsure about using her phone. She states that she called but did not get any answers from [employer]. Pt is using a glucometer until she can get a new receiver. Will ask the Rep when she comes by office

## 2023-02-19 DIAGNOSIS — F17210 Nicotine dependence, cigarettes, uncomplicated: Secondary | ICD-10-CM | POA: Diagnosis not present

## 2023-02-19 DIAGNOSIS — Z122 Encounter for screening for malignant neoplasm of respiratory organs: Secondary | ICD-10-CM | POA: Diagnosis not present

## 2023-02-23 DIAGNOSIS — E1169 Type 2 diabetes mellitus with other specified complication: Secondary | ICD-10-CM | POA: Diagnosis not present

## 2023-02-23 DIAGNOSIS — J449 Chronic obstructive pulmonary disease, unspecified: Secondary | ICD-10-CM | POA: Diagnosis not present

## 2023-02-23 DIAGNOSIS — G4733 Obstructive sleep apnea (adult) (pediatric): Secondary | ICD-10-CM | POA: Diagnosis not present

## 2023-02-23 DIAGNOSIS — Z720 Tobacco use: Secondary | ICD-10-CM | POA: Diagnosis not present

## 2023-02-23 DIAGNOSIS — J439 Emphysema, unspecified: Secondary | ICD-10-CM | POA: Diagnosis not present

## 2023-02-23 DIAGNOSIS — R030 Elevated blood-pressure reading, without diagnosis of hypertension: Secondary | ICD-10-CM | POA: Diagnosis not present

## 2023-03-02 ENCOUNTER — Other Ambulatory Visit: Payer: Self-pay

## 2023-03-02 DIAGNOSIS — E1169 Type 2 diabetes mellitus with other specified complication: Secondary | ICD-10-CM

## 2023-03-02 MED ORDER — FREESTYLE LIBRE 2 READER DEVI
0 refills | Status: DC
Start: 2023-03-02 — End: 2024-03-19

## 2023-03-08 DIAGNOSIS — E1169 Type 2 diabetes mellitus with other specified complication: Secondary | ICD-10-CM | POA: Diagnosis not present

## 2023-03-13 ENCOUNTER — Encounter: Payer: Self-pay | Admitting: "Endocrinology

## 2023-03-13 ENCOUNTER — Ambulatory Visit (INDEPENDENT_AMBULATORY_CARE_PROVIDER_SITE_OTHER): Payer: 59 | Admitting: "Endocrinology

## 2023-03-13 VITALS — BP 122/64 | HR 76 | Ht 61.0 in | Wt 203.8 lb

## 2023-03-13 DIAGNOSIS — Z794 Long term (current) use of insulin: Secondary | ICD-10-CM | POA: Insufficient documentation

## 2023-03-13 DIAGNOSIS — I10 Essential (primary) hypertension: Secondary | ICD-10-CM

## 2023-03-13 DIAGNOSIS — R5383 Other fatigue: Secondary | ICD-10-CM | POA: Diagnosis not present

## 2023-03-13 DIAGNOSIS — E782 Mixed hyperlipidemia: Secondary | ICD-10-CM | POA: Diagnosis not present

## 2023-03-13 DIAGNOSIS — E1169 Type 2 diabetes mellitus with other specified complication: Secondary | ICD-10-CM

## 2023-03-13 DIAGNOSIS — E7800 Pure hypercholesterolemia, unspecified: Secondary | ICD-10-CM | POA: Diagnosis not present

## 2023-03-13 DIAGNOSIS — F17200 Nicotine dependence, unspecified, uncomplicated: Secondary | ICD-10-CM

## 2023-03-13 DIAGNOSIS — E875 Hyperkalemia: Secondary | ICD-10-CM | POA: Diagnosis not present

## 2023-03-13 MED ORDER — OZEMPIC (0.25 OR 0.5 MG/DOSE) 2 MG/3ML ~~LOC~~ SOPN
0.2500 mg | PEN_INJECTOR | SUBCUTANEOUS | 0 refills | Status: DC
Start: 2023-03-13 — End: 2023-06-29

## 2023-03-13 MED ORDER — TOUJEO SOLOSTAR 300 UNIT/ML ~~LOC~~ SOPN
40.0000 [IU] | PEN_INJECTOR | Freq: Every day | SUBCUTANEOUS | 1 refills | Status: DC
Start: 2023-03-13 — End: 2023-05-23

## 2023-03-13 NOTE — Progress Notes (Signed)
03/13/2023   Endocrinology follow-up note   Subjective:    Patient ID: Victoria Lara, female    DOB: Mar 29, 1956.  She is here in follow-up for the management of symptomatic type 2 diabetes, hyperlipidemia, hypertension.    PMD:   Juliette Alcide, MD  Past Medical History:  Diagnosis Date   Anemia    Anxiety    Arthritis    Bronchitis    Complication of anesthesia    had to come back to ER after shoulder surgery at [facility]; hard to wake up; low O2 sats   Depression    Diabetes mellitus without complication (HCC)    GERD (gastroesophageal reflux disease)    Hyperlipidemia    Hypertension    Neuropathy    Takes Gabapentin   Pneumonia 2016   Restless leg syndrome    Seasonal allergies    Shortness of breath dyspnea    Sleep apnea    Syncope    TIA (transient ischemic attack) 2006   Past Surgical History:  Procedure Laterality Date   BREAST LUMPECTOMY Bilateral    CATARACT EXTRACTION W/PHACO Right 08/20/2014   Procedure: CATARACT EXTRACTION PHACO AND INTRAOCULAR LENS PLACEMENT (IOC);  Surgeon: Gemma Payor, MD;  Location: [facility];  Service: Ophthalmology;  Laterality: Right;  CDE:5.31   CATARACT EXTRACTION W/PHACO Left 01/19/2020   Procedure: CATARACT EXTRACTION PHACO AND INTRAOCULAR LENS PLACEMENT (IOC);  Surgeon: Fabio Pierce, MD;  Location: [facility];  Service: Ophthalmology;  Laterality: Left;  CDE: 12.32   HAND SURGERY Bilateral    NASAL SEPTOPLASTY W/ TURBINOPLASTY Bilateral 01/26/2016   Procedure: NASAL SEPTOPLASTY WITH BILATERAL TURBINATE REDUCTION;  Surgeon: Newman Pies, MD;  Location: [facility];  Service: ENT;  Laterality: Bilateral;   NASAL SEPTUM SURGERY  01/26/2016   NASAL SINUS SURGERY Bilateral 01/26/2016   Procedure: BILATERAL ENDOSCOPIC MAXILLARY ANTROSTOMY;  Surgeon: Newman Pies, MD;  Location: [facility];  Service: ENT;  Laterality: Bilateral;   SHOULDER ARTHROSCOPY WITH ROTATOR CUFF REPAIR Right    TUBAL LIGATION     Social History   Socioeconomic History   Marital  status: Widowed    Spouse name: Not on file   Number of children: 1   Years of education: 17   Highest education level: Not on file  Occupational History   Occupation: Unemployed  Tobacco Use   Smoking status: Some Days    Current packs/day: 0.10    Average packs/day: 0.1 packs/day for 44.0 years (4.4 ttl pk-yrs)    Types: Cigarettes   Smokeless tobacco: Never  Vaping Use   Vaping status: Never Used  Substance and Sexual Activity   Alcohol use: No    Alcohol/week: 0.0 standard drinks of alcohol   Drug use: No   Sexual activity: Not on file  Other Topics Concern   Not on file  Social History Narrative   Lives with boyfriend   Caffeine use: Drinks 1 soda a day    Social Determinants of Health   Financial Resource Strain: Not on file  Food Insecurity: Not on file  Transportation Needs: Not on file  Physical Activity: Not on file  Stress: Not on file  Social Connections: Not on file   Outpatient Encounter Medications as of 03/13/2023  Medication Sig   Semaglutide,0.25 or 0.5MG /DOS, (OZEMPIC, 0.25 OR 0.5 MG/DOSE,) 2 MG/3ML SOPN Inject 0.25 mg into the skin once a week.   ACCU-CHEK AVIVA PLUS test strip USE TWICE DAILY TO CHECK SUGAR   ACCU-CHEK GUIDE test strip USE ONE  STRIP TO TEST BLOOD SUGAR TWICE DAILY   Accu-Chek Softclix Lancets lancets USE TO CHECK BLOOD SUGAR TWICE DAILY   acetaminophen (TYLENOL) 500 MG tablet Take 1,500 mg by mouth 2 (two) times daily as needed for moderate pain or headache.   albuterol (PROVENTIL) (2.5 MG/3ML) 0.083% nebulizer solution Take 3 mLs (2.5 mg total) by nebulization every 6 (six) hours as needed for wheezing.   aspirin EC 81 MG tablet Take 81 mg by mouth daily.   atorvastatin (LIPITOR) 20 MG tablet TAKE ONE TABLET BY MOUTH DAILY (Patient taking differently: Take 20 mg by mouth daily. )   Blood Glucose Monitoring Suppl (ACCU-CHEK AVIVA) device Use as instructed   buPROPion (WELLBUTRIN SR) 150 MG 12 hr tablet Take 150 mg by mouth 2 (two)  times daily.   cetirizine (ZYRTEC) 10 MG tablet Take 10 mg by mouth daily.   citalopram (CELEXA) 20 MG tablet Take 40 mg by mouth daily.   clonazePAM (KLONOPIN) 0.5 MG tablet Take 0.5 mg by mouth every 8 (eight) hours as needed for anxiety.    Continuous Blood Gluc Sensor (FREESTYLE LIBRE 2 SENSOR) MISC 1 Piece by Does not apply route every 14 (fourteen) days.   Continuous Glucose Receiver (FREESTYLE LIBRE 2 READER) DEVI As directed   EASY COMFORT PEN NEEDLES 31G X 6 MM MISC USE AS DIRECTED ONCE DAILY.   fluticasone (FLONASE) 50 MCG/ACT nasal spray Place 1 spray into both nostrils daily.   gabapentin (NEURONTIN) 300 MG capsule Take 300 mg by mouth 2 (two) times daily.   glucose blood (ACCU-CHEK AVIVA) test strip To test 4 times a day   hydrochlorothiazide (HYDRODIURIL) 25 MG tablet Take 25 mg by mouth daily.   insulin glargine, 1 Unit Dial, (TOUJEO SOLOSTAR) 300 UNIT/ML Solostar Pen Inject 40 Units into the skin at bedtime.   metFORMIN (GLUCOPHAGE) 850 MG tablet Take 850 mg by mouth 2 (two) times daily with a meal.   montelukast (SINGULAIR) 10 MG tablet Take 10 mg by mouth at bedtime.   omeprazole (PRILOSEC) 20 MG capsule Take 20 mg by mouth daily.   PROAIR HFA 108 (90 Base) MCG/ACT inhaler Inhale 2 puffs into the lungs every 4 (four) hours as needed for wheezing or shortness of breath.    rOPINIRole (REQUIP) 0.25 MG tablet Take 0.25 mg by mouth See admin instructions. Take 0.25 mg at night may take a second 0.25 mg dose as needed for restless legs   TRELEGY ELLIPTA 100-62.5-25 MCG/ACT AEPB Inhale 1 puff into the lungs daily.   valsartan (DIOVAN) 160 MG tablet Take 160 mg by mouth daily.   [DISCONTINUED] insulin glargine, 1 Unit Dial, (TOUJEO SOLOSTAR) 300 UNIT/ML Solostar Pen Inject 50 Units into the skin at bedtime.   No facility-administered encounter medications on file as of 03/13/2023.   ALLERGIES: Allergies  Allergen Reactions   Lisinopril Cough   VACCINATION STATUS: Immunization  History  Administered Date(s) Administered   Influenza,inj,Quad PF,6+ Mos 03/26/2018    Diabetes She presents for her follow-up diabetic visit. She has type 2 diabetes mellitus. Onset time: She was diagnosed at approximate age of 44 years. Her disease course has been improving. There are no hypoglycemic associated symptoms. Pertinent negatives for hypoglycemia include no confusion, headaches, pallor or seizures. Pertinent negatives for diabetes include no blurred vision, no chest pain, no fatigue, no polydipsia, no polyphagia and no polyuria. There are no hypoglycemic complications. Symptoms are improving. Diabetic complications include a CVA. (She is a chronic heavy smoker.) Risk factors for coronary  artery disease include diabetes mellitus, dyslipidemia, hypertension, obesity, sedentary lifestyle and tobacco exposure. Current diabetic treatment includes oral agent (monotherapy). Her weight is decreasing steadily. She is following a generally unhealthy diet. When asked about meal planning, she reported none. She rarely participates in exercise. Her home blood glucose trend is decreasing steadily. Her breakfast blood glucose range is generally 110-130 mg/dl. Her lunch blood glucose range is generally 110-130 mg/dl. Her dinner blood glucose range is generally 110-130 mg/dl. Her bedtime blood glucose range is generally 110-130 mg/dl. Her overall blood glucose range is 110-130 mg/dl. (She presents with with her CGM showing controlled glycemic profile averaging 122 for the last 14 days.  Her AGP report shows 94% time range, 4% level 1 hyperglycemia she has 2% level 1 hypoglycemia.   She is interested in Ozempic.) An ACE inhibitor/angiotensin II receptor blocker is being taken. Eye exam is current.  Hyperlipidemia This is a chronic problem. The current episode started more than 1 year ago. The problem is controlled. Recent lipid tests were reviewed and are high. Exacerbating diseases include diabetes and obesity.  Associated symptoms include shortness of breath. Pertinent negatives include no chest pain or myalgias. Current antihyperlipidemic treatment includes statins. Risk factors for coronary artery disease include diabetes mellitus, dyslipidemia, hypertension, a sedentary lifestyle, obesity and post-menopausal.  Hypertension This is a chronic problem. The current episode started more than 1 year ago. The problem is controlled. Associated symptoms include shortness of breath. Pertinent negatives include no blurred vision, chest pain, headaches or palpitations. Risk factors for coronary artery disease include diabetes mellitus, dyslipidemia, sedentary lifestyle, smoking/tobacco exposure, post-menopausal state, family history and obesity. Past treatments include ACE inhibitors. Hypertensive end-organ damage includes CVA.    Review of systems  Limited as above.   Objective:    BP 122/64   Pulse 76   Ht 5\' 1"  (1.549 m)   Wt 203 lb 12.8 oz (92.4 kg)   BMI 38.51 kg/m   Wt Readings from Last 3 Encounters:  03/13/23 203 lb 12.8 oz (92.4 kg)  10/04/22 213 lb 6.4 oz (96.8 kg)  07/25/22 213 lb (96.6 kg)     Physical Exam- Limited  Constitutional:  Body mass index is 38.51 kg/m. , not in acute distress, normal state of mind   CMP     Component Value Date/Time   NA 140 09/27/2022 0846   K 4.5 09/27/2022 0846   CL 96 09/27/2022 0846   CO2 27 09/27/2022 0846   GLUCOSE 84 09/27/2022 0846   GLUCOSE 160 (H) 02/11/2020 0727   BUN 16 09/27/2022 0846   CREATININE 0.83 09/27/2022 0846   CREATININE 0.89 02/11/2020 0727   CALCIUM 9.8 09/27/2022 0846   PROT 6.7 09/27/2022 0846   ALBUMIN 4.0 09/27/2022 0846   AST 20 09/27/2022 0846   ALT 13 09/27/2022 0846   ALKPHOS 72 09/27/2022 0846   BILITOT 0.5 09/27/2022 0846   GFRNONAA 68 02/11/2020 0727   GFRAA 79 02/11/2020 0727     Diabetic Labs (most recent): Lab Results  Component Value Date   HGBA1C 6.5 10/04/2022   HGBA1C 6.2 05/02/2022    HGBA1C 7.5 (A) 12/30/2021   MICROALBUR 30 05/26/2021   MICROALBUR 1.1 02/11/2020   MICROALBUR 22.8 11/27/2019   Lipid Panel     Component Value Date/Time   CHOL 135 09/27/2022 0846   TRIG 164 (H) 09/27/2022 0846   HDL 36 (L) 09/27/2022 0846   CHOLHDL 3.8 09/27/2022 0846   CHOLHDL 3.3 02/11/2020 0727   LDLCALC 71  09/27/2022 0846   LDLCALC 68 02/11/2020 0727      Assessment & Plan:   1. Uncontrolled type 2 diabetes mellitus with other circulatory complication, without long-term current use of insulin (HCC)   - Patient has currently improving  type 2 DM since  67 years of age.  She presents with with her CGM showing controlled glycemic profile averaging 122 for the last 14 days.  Her AGP report shows 94% time range, 4% level 1 hyperglycemia she has 2% level 1 hypoglycemia.   She is interested in Ozempic. Here recent A1c was 6.4%.  - Recent labs reviewed.   Her diabetes is complicated by TIA, chronic heavy smoking and patient remains at a high risk for more acute and chronic complications of diabetes which include CAD, CVA, CKD, retinopathy, and neuropathy. These are all discussed in detail with the patient.  - she  is benefiting from Lifestyle Medicine, acknowledges that there is a room for improvement in her food and drink choices. - Suggestion is made for her to avoid simple carbohydrates  from her diet including Cakes, Sweet Desserts, Ice Cream, Soda (diet and regular), Sweet Tea, Candies, Chips, Cookies, Store Bought Juices, Alcohol , Artificial Sweeteners,  Coffee Creamer, and "Sugar-free" Products, Lemonade. This will help patient to have more stable blood glucose profile and potentially avoid unintended weight gain.  The following Lifestyle Medicine recommendations according to American College of Lifestyle Medicine  [facility]) were discussed and and offered to patient and she  agrees to start the journey:  A. Whole Foods, Plant-Based Nutrition comprising of fruits and  vegetables, plant-based proteins, whole-grain carbohydrates was discussed in detail with the patient.   A list for source of those nutrients were also provided to the patient.  Patient will use only water or unsweetened tea for hydration. B.  The need to stay away from risky substances including alcohol, smoking; obtaining 7 to 9 hours of restorative sleep, at least 150 minutes of moderate intensity exercise weekly, the importance of healthy social connections,  and stress management techniques were discussed. C.  A full color page of  Calorie density of various food groups per pound showing examples of each food groups was provided to the patient.   - I have approached patient with the following individualized plan to manage diabetes and patient agrees:   Based on her presentation with tightening glycemic profile, she will be considered for lower dose of insulin.   I discussed and lowered her Toujeo to 40 units nightly, advised to continue to use her CGM continuously.     -She is advised to continue metformin 850 mg by mouth twice a day, therapeutically suitable for patient. -She is advised to discontinue glipizide for now.   She is encouraged to call clinic for hypoglycemia below 70 or hyperglycemia greater than 200 mg per DL.    - Patient specific target  A1c;  LDL, HDL, Triglycerides,  were discussed in detail. She is advised to quit smoking. - I discussed and prescribed Ozempic 0.25mg  sq weekly to advance as tolerated. SE and precautions discussed with her.    2) BP/HTN:  -Her blood pressure is controlled to target.  She is advised to continue her current blood pressure medications including lisinopril 20 mg p.o. daily.    3) Lipids/HPL: Her recent lipid panel showed controlled LDL at 71. she is advised to continue Atorvastatin 20 mg p.o. nightly.    Side effects and precautions discussed with her.    4)  Weight/Diet:  Her BMI is 38.51- progressively losing weight.   This still is  clearly complicating her diabetes care.  She is a candidate for modest weight loss.  She did not have any further success in weight loss, she still admits significant  dietary indiscretion, CDE Consult  Is in progress  , exercise, and detailed carbohydrates information provided. Possible plant-based diet discussed above will help with her chronic comorbidities including obesity.  6) Chronic Care/Health Maintenance:  -Patient is on ACEI and Statin medications and encouraged to continue to follow up with Ophthalmology, Podiatrist at least yearly or according to recommendations, and advised to  quit smoking. I have recommended yearly flu vaccine and pneumonia vaccination at least every 5 years; moderate intensity exercise for up to 150 minutes weekly; and  sleep for at least 7 hours a day.  The patient was counseled on the dangers of tobacco use, and was advised to quit.  Reviewed strategies to maximize success, including removing cigarettes and smoking materials from environment.   She had normal ABI on September 01, 2020, this study will be repeated in March 2027 or sooner if needed.   - I advised patient to maintain close follow up with Burdine, Ananias Pilgrim, MD for primary care needs.     I spent  26  minutes in the care of the patient today including review of labs from CMP, Lipids, Thyroid Function, Hematology (current and previous including abstractions from other facilities); face-to-face time discussing  her blood glucose readings/logs, discussing hypoglycemia and hyperglycemia episodes and symptoms, medications doses, her options of short and long term treatment based on the latest standards of care / guidelines;  discussion about incorporating lifestyle medicine;  and documenting the encounter. Risk reduction counseling performed per USPSTF guidelines to reduce obesity and cardiovascular risk factors.     Please refer to Patient Instructions for Blood Glucose Monitoring and Insulin/Medications  Dosing Guide"  in media tab for additional information. Please  also refer to " Patient Self Inventory" in the Media  tab for reviewed elements of pertinent patient history.  Lurlean Horns participated in the discussions, expressed understanding, and voiced agreement with the above plans.  All questions were answered to her satisfaction. she is encouraged to contact clinic should she have any questions or concerns prior to her return visit.    Follow up plan: - Return in about 4 months (around 07/13/2023) for Meter/CGM/Logs, A1c here, F/U with Pre-visit Labs, Meter/CGM/Logs, A1c here.  Marquis Lunch, MD Phone: [phone]  Fax: [phone]   -  This note was partially dictated with voice recognition software. Similar sounding words can be transcribed inadequately or may not  be corrected upon review.  03/13/2023, 2:40 PM

## 2023-03-13 NOTE — Patient Instructions (Signed)

## 2023-03-14 DIAGNOSIS — M25512 Pain in left shoulder: Secondary | ICD-10-CM | POA: Diagnosis not present

## 2023-03-14 DIAGNOSIS — M48062 Spinal stenosis, lumbar region with neurogenic claudication: Secondary | ICD-10-CM | POA: Diagnosis not present

## 2023-03-14 DIAGNOSIS — R531 Weakness: Secondary | ICD-10-CM | POA: Diagnosis not present

## 2023-03-16 DIAGNOSIS — M48062 Spinal stenosis, lumbar region with neurogenic claudication: Secondary | ICD-10-CM | POA: Diagnosis not present

## 2023-03-16 DIAGNOSIS — R531 Weakness: Secondary | ICD-10-CM | POA: Diagnosis not present

## 2023-03-16 DIAGNOSIS — M25512 Pain in left shoulder: Secondary | ICD-10-CM | POA: Diagnosis not present

## 2023-03-17 DIAGNOSIS — L0390 Cellulitis, unspecified: Secondary | ICD-10-CM | POA: Diagnosis not present

## 2023-03-19 DIAGNOSIS — J439 Emphysema, unspecified: Secondary | ICD-10-CM | POA: Diagnosis not present

## 2023-03-19 DIAGNOSIS — E1169 Type 2 diabetes mellitus with other specified complication: Secondary | ICD-10-CM | POA: Diagnosis not present

## 2023-03-19 DIAGNOSIS — Z720 Tobacco use: Secondary | ICD-10-CM | POA: Diagnosis not present

## 2023-03-19 DIAGNOSIS — Z23 Encounter for immunization: Secondary | ICD-10-CM | POA: Diagnosis not present

## 2023-03-19 DIAGNOSIS — I700 Atherosclerosis of aorta: Secondary | ICD-10-CM | POA: Diagnosis not present

## 2023-03-19 DIAGNOSIS — J449 Chronic obstructive pulmonary disease, unspecified: Secondary | ICD-10-CM | POA: Diagnosis not present

## 2023-03-19 DIAGNOSIS — S61012A Laceration without foreign body of left thumb without damage to nail, initial encounter: Secondary | ICD-10-CM | POA: Diagnosis not present

## 2023-03-19 DIAGNOSIS — R030 Elevated blood-pressure reading, without diagnosis of hypertension: Secondary | ICD-10-CM | POA: Diagnosis not present

## 2023-03-19 DIAGNOSIS — G4733 Obstructive sleep apnea (adult) (pediatric): Secondary | ICD-10-CM | POA: Diagnosis not present

## 2023-03-20 DIAGNOSIS — M25512 Pain in left shoulder: Secondary | ICD-10-CM | POA: Diagnosis not present

## 2023-03-20 DIAGNOSIS — M48062 Spinal stenosis, lumbar region with neurogenic claudication: Secondary | ICD-10-CM | POA: Diagnosis not present

## 2023-03-20 DIAGNOSIS — R531 Weakness: Secondary | ICD-10-CM | POA: Diagnosis not present

## 2023-03-22 DIAGNOSIS — M48062 Spinal stenosis, lumbar region with neurogenic claudication: Secondary | ICD-10-CM | POA: Diagnosis not present

## 2023-03-22 DIAGNOSIS — M25512 Pain in left shoulder: Secondary | ICD-10-CM | POA: Diagnosis not present

## 2023-03-22 DIAGNOSIS — R531 Weakness: Secondary | ICD-10-CM | POA: Diagnosis not present

## 2023-03-28 DIAGNOSIS — M25512 Pain in left shoulder: Secondary | ICD-10-CM | POA: Diagnosis not present

## 2023-03-28 DIAGNOSIS — R531 Weakness: Secondary | ICD-10-CM | POA: Diagnosis not present

## 2023-03-28 DIAGNOSIS — M48062 Spinal stenosis, lumbar region with neurogenic claudication: Secondary | ICD-10-CM | POA: Diagnosis not present

## 2023-04-07 DIAGNOSIS — E1169 Type 2 diabetes mellitus with other specified complication: Secondary | ICD-10-CM | POA: Diagnosis not present

## 2023-05-08 DIAGNOSIS — E1169 Type 2 diabetes mellitus with other specified complication: Secondary | ICD-10-CM | POA: Diagnosis not present

## 2023-05-23 ENCOUNTER — Other Ambulatory Visit: Payer: Self-pay | Admitting: "Endocrinology

## 2023-05-23 DIAGNOSIS — Z794 Long term (current) use of insulin: Secondary | ICD-10-CM

## 2023-06-07 DIAGNOSIS — E1169 Type 2 diabetes mellitus with other specified complication: Secondary | ICD-10-CM | POA: Diagnosis not present

## 2023-06-28 ENCOUNTER — Other Ambulatory Visit: Payer: Self-pay | Admitting: "Endocrinology

## 2023-07-08 DIAGNOSIS — E1169 Type 2 diabetes mellitus with other specified complication: Secondary | ICD-10-CM | POA: Diagnosis not present

## 2023-07-10 DIAGNOSIS — E1169 Type 2 diabetes mellitus with other specified complication: Secondary | ICD-10-CM | POA: Diagnosis not present

## 2023-07-10 DIAGNOSIS — Z794 Long term (current) use of insulin: Secondary | ICD-10-CM | POA: Diagnosis not present

## 2023-07-11 LAB — COMPREHENSIVE METABOLIC PANEL
ALT: 16 [IU]/L (ref 0–32)
AST: 19 [IU]/L (ref 0–40)
Albumin: 4.2 g/dL (ref 3.9–4.9)
Alkaline Phosphatase: 73 [IU]/L (ref 44–121)
BUN/Creatinine Ratio: 20 (ref 12–28)
BUN: 15 mg/dL (ref 8–27)
Bilirubin Total: 0.4 mg/dL (ref 0.0–1.2)
CO2: 29 mmol/L (ref 20–29)
Calcium: 10.2 mg/dL (ref 8.7–10.3)
Chloride: 98 mmol/L (ref 96–106)
Creatinine, Ser: 0.76 mg/dL (ref 0.57–1.00)
Globulin, Total: 2.4 g/dL (ref 1.5–4.5)
Glucose: 97 mg/dL (ref 70–99)
Potassium: 4.9 mmol/L (ref 3.5–5.2)
Sodium: 143 mmol/L (ref 134–144)
Total Protein: 6.6 g/dL (ref 6.0–8.5)
eGFR: 85 mL/min/{1.73_m2} (ref >59)

## 2023-07-16 ENCOUNTER — Encounter: Payer: Self-pay | Admitting: "Endocrinology

## 2023-07-16 ENCOUNTER — Ambulatory Visit (INDEPENDENT_AMBULATORY_CARE_PROVIDER_SITE_OTHER): Payer: 59 | Admitting: "Endocrinology

## 2023-07-16 VITALS — BP 110/82 | HR 76 | Ht 61.0 in | Wt 198.4 lb

## 2023-07-16 DIAGNOSIS — F17200 Nicotine dependence, unspecified, uncomplicated: Secondary | ICD-10-CM | POA: Diagnosis not present

## 2023-07-16 DIAGNOSIS — I10 Essential (primary) hypertension: Secondary | ICD-10-CM

## 2023-07-16 DIAGNOSIS — E782 Mixed hyperlipidemia: Secondary | ICD-10-CM | POA: Diagnosis not present

## 2023-07-16 DIAGNOSIS — E1169 Type 2 diabetes mellitus with other specified complication: Secondary | ICD-10-CM

## 2023-07-16 DIAGNOSIS — Z794 Long term (current) use of insulin: Secondary | ICD-10-CM

## 2023-07-16 LAB — POCT GLYCOSYLATED HEMOGLOBIN (HGB A1C): HbA1c, POC (controlled diabetic range): 7.1 % — AB (ref 0.0–7.0)

## 2023-07-16 MED ORDER — OZEMPIC (0.25 OR 0.5 MG/DOSE) 2 MG/3ML ~~LOC~~ SOPN: 0.5000 mg | PEN_INJECTOR | SUBCUTANEOUS | 0 refills | Status: DC

## 2023-07-16 MED ORDER — TOUJEO SOLOSTAR 300 UNIT/ML ~~LOC~~ SOPN: 30.0000 [IU] | PEN_INJECTOR | Freq: Every day | SUBCUTANEOUS | 0 refills | Status: DC

## 2023-07-16 MED ORDER — FREESTYLE LIBRE 2 SENSOR MISC: 1.0000 | 3 refills | Status: DC

## 2023-07-16 NOTE — Progress Notes (Signed)
07/16/2023   Endocrinology follow-up note   Subjective:    Patient ID: Victoria Lara, female    DOB: December 14, 1955.  She is here in follow-up for the management of symptomatic type 2 diabetes, hyperlipidemia, hypertension.    PMD:   Juliette Alcide, MD  Past Medical History:  Diagnosis Date   Anemia    Anxiety    Arthritis    Bronchitis    Complication of anesthesia    had to come back to ER after shoulder surgery at [facility]; hard to wake up; low O2 sats   Depression    Diabetes mellitus without complication (HCC)    GERD (gastroesophageal reflux disease)    Hyperlipidemia    Hypertension    Neuropathy    Takes Gabapentin   Pneumonia 2016   Restless leg syndrome    Seasonal allergies    Shortness of breath dyspnea    Sleep apnea    Syncope    TIA (transient ischemic attack) 2006   Past Surgical History:  Procedure Laterality Date   BREAST LUMPECTOMY Bilateral    CATARACT EXTRACTION W/PHACO Right 08/20/2014   Procedure: CATARACT EXTRACTION PHACO AND INTRAOCULAR LENS PLACEMENT (IOC);  Surgeon: Gemma Payor, MD;  Location: [facility];  Service: Ophthalmology;  Laterality: Right;  CDE:5.31   CATARACT EXTRACTION W/PHACO Left 01/19/2020   Procedure: CATARACT EXTRACTION PHACO AND INTRAOCULAR LENS PLACEMENT (IOC);  Surgeon: Fabio Pierce, MD;  Location: [facility];  Service: Ophthalmology;  Laterality: Left;  CDE: 12.32   HAND SURGERY Bilateral    NASAL SEPTOPLASTY W/ TURBINOPLASTY Bilateral 01/26/2016   Procedure: NASAL SEPTOPLASTY WITH BILATERAL TURBINATE REDUCTION;  Surgeon: Newman Pies, MD;  Location: [facility];  Service: ENT;  Laterality: Bilateral;   NASAL SEPTUM SURGERY  01/26/2016   NASAL SINUS SURGERY Bilateral 01/26/2016   Procedure: BILATERAL ENDOSCOPIC MAXILLARY ANTROSTOMY;  Surgeon: Newman Pies, MD;  Location: [facility];  Service: ENT;  Laterality: Bilateral;   SHOULDER ARTHROSCOPY WITH ROTATOR CUFF REPAIR Right    TUBAL LIGATION     Social History   Socioeconomic History   Marital  status: Widowed    Spouse name: Not on file   Number of children: 1   Years of education: 6   Highest education level: Not on file  Occupational History   Occupation: Unemployed  Tobacco Use   Smoking status: Some Days    Current packs/day: 0.10    Average packs/day: 0.1 packs/day for 44.0 years (4.4 ttl pk-yrs)    Types: Cigarettes   Smokeless tobacco: Never  Vaping Use   Vaping status: Never Used  Substance and Sexual Activity   Alcohol use: No    Alcohol/week: 0.0 standard drinks of alcohol   Drug use: No   Sexual activity: Not on file  Other Topics Concern   Not on file  Social History Narrative   Lives with boyfriend   Caffeine use: Drinks 1 soda a day    Social Drivers of Corporate investment banker Strain: Not on file  Food Insecurity: Not on file  Transportation Needs: Not on file  Physical Activity: Not on file  Stress: Not on file  Social Connections: Not on file   Outpatient Encounter Medications as of 07/16/2023  Medication Sig   ACCU-CHEK AVIVA PLUS test strip USE TWICE DAILY TO CHECK SUGAR   ACCU-CHEK GUIDE test strip USE ONE STRIP TO TEST BLOOD SUGAR TWICE DAILY   Accu-Chek Softclix Lancets lancets USE TO CHECK BLOOD SUGAR TWICE DAILY  acetaminophen (TYLENOL) 500 MG tablet Take 1,500 mg by mouth 2 (two) times daily as needed for moderate pain or headache.   albuterol (PROVENTIL) (2.5 MG/3ML) 0.083% nebulizer solution Take 3 mLs (2.5 mg total) by nebulization every 6 (six) hours as needed for wheezing.   aspirin EC 81 MG tablet Take 81 mg by mouth daily.   atorvastatin (LIPITOR) 20 MG tablet TAKE ONE TABLET BY MOUTH DAILY (Patient taking differently: Take 20 mg by mouth daily. )   Blood Glucose Monitoring Suppl (ACCU-CHEK AVIVA) device Use as instructed   buPROPion (WELLBUTRIN SR) 150 MG 12 hr tablet Take 150 mg by mouth 2 (two) times daily.   cetirizine (ZYRTEC) 10 MG tablet Take 10 mg by mouth daily.   citalopram (CELEXA) 20 MG tablet Take 40 mg by  mouth daily.   clonazePAM (KLONOPIN) 0.5 MG tablet Take 0.5 mg by mouth every 8 (eight) hours as needed for anxiety.    Continuous Glucose Receiver (FREESTYLE LIBRE 2 READER) DEVI As directed   Continuous Glucose Sensor (FREESTYLE LIBRE 2 SENSOR) MISC 1 Piece by Does not apply route every 14 (fourteen) days.   EASY COMFORT PEN NEEDLES 31G X 6 MM MISC USE AS DIRECTED ONCE DAILY.   fluticasone (FLONASE) 50 MCG/ACT nasal spray Place 1 spray into both nostrils daily.   gabapentin (NEURONTIN) 300 MG capsule Take 300 mg by mouth 2 (two) times daily.   glucose blood (ACCU-CHEK AVIVA) test strip To test 4 times a day   hydrochlorothiazide (HYDRODIURIL) 25 MG tablet Take 25 mg by mouth daily.   insulin glargine, 1 Unit Dial, (TOUJEO SOLOSTAR) 300 UNIT/ML Solostar Pen Inject 30 Units into the skin at bedtime.   metFORMIN (GLUCOPHAGE) 850 MG tablet Take 850 mg by mouth 2 (two) times daily with a meal.   montelukast (SINGULAIR) 10 MG tablet Take 10 mg by mouth at bedtime.   omeprazole (PRILOSEC) 20 MG capsule Take 20 mg by mouth daily.   PROAIR HFA 108 (90 Base) MCG/ACT inhaler Inhale 2 puffs into the lungs every 4 (four) hours as needed for wheezing or shortness of breath.    rOPINIRole (REQUIP) 0.25 MG tablet Take 0.25 mg by mouth See admin instructions. Take 0.25 mg at night may take a second 0.25 mg dose as needed for restless legs   Semaglutide,0.25 or 0.5MG /DOS, (OZEMPIC, 0.25 OR 0.5 MG/DOSE,) 2 MG/3ML SOPN Inject 0.5 mg into the skin once a week.   TRELEGY ELLIPTA 100-62.5-25 MCG/ACT AEPB Inhale 1 puff into the lungs daily.   valsartan (DIOVAN) 160 MG tablet Take 160 mg by mouth daily.   [DISCONTINUED] Continuous Blood Gluc Sensor (FREESTYLE LIBRE 2 SENSOR) MISC 1 Piece by Does not apply route every 14 (fourteen) days.   [DISCONTINUED] insulin glargine, 1 Unit Dial, (TOUJEO SOLOSTAR) 300 UNIT/ML Solostar Pen Inject 40 Units into the skin at bedtime.   [DISCONTINUED] OZEMPIC, 0.25 OR 0.5 MG/DOSE, 2  MG/3ML SOPN INJECT 0.25 MG SUBCUTANEOUSLY ONCE A WEEK   No facility-administered encounter medications on file as of 07/16/2023.   ALLERGIES: Allergies  Allergen Reactions   Lisinopril Cough   VACCINATION STATUS: Immunization History  Administered Date(s) Administered   Influenza,inj,Quad PF,6+ Mos 03/26/2018    Diabetes She presents for her follow-up diabetic visit. She has type 2 diabetes mellitus. Onset time: She was diagnosed at approximate age of 44 years. Her disease course has been worsening. There are no hypoglycemic associated symptoms. Pertinent negatives for hypoglycemia include no confusion, headaches, pallor or seizures. Pertinent negatives for  diabetes include no blurred vision, no chest pain, no fatigue, no polydipsia, no polyphagia and no polyuria. There are no hypoglycemic complications. Symptoms are worsening. Diabetic complications include a CVA. (She is a chronic heavy smoker.) Risk factors for coronary artery disease include diabetes mellitus, dyslipidemia, hypertension, obesity, sedentary lifestyle and tobacco exposure. Current diabetic treatment includes oral agent (monotherapy). Her weight is decreasing steadily. She is following a generally unhealthy diet. When asked about meal planning, she reported none. She rarely participates in exercise. Her home blood glucose trend is increasing steadily. Her breakfast blood glucose range is generally 110-130 mg/dl. Her lunch blood glucose range is generally 110-130 mg/dl. Her dinner blood glucose range is generally 110-130 mg/dl. Her bedtime blood glucose range is generally 110-130 mg/dl. Her overall blood glucose range is 110-130 mg/dl. (She presents with with her CGM showing controlled glycemic profile averaging 126, however her point-of-care A1c is increasing to 7.1% from 6.5%.  Her AGP report shows 94% time range, 4% level 1 hyperglycemia.  2% Level One hypoglycemia.  She has tolerated starting dose of Ozempic.) An ACE  inhibitor/angiotensin II receptor blocker is being taken. Eye exam is current.  Hyperlipidemia This is a chronic problem. The current episode started more than 1 year ago. The problem is controlled. Recent lipid tests were reviewed and are high. Exacerbating diseases include diabetes and obesity. Associated symptoms include shortness of breath. Pertinent negatives include no chest pain or myalgias. Current antihyperlipidemic treatment includes statins. Risk factors for coronary artery disease include diabetes mellitus, dyslipidemia, hypertension, a sedentary lifestyle, obesity and post-menopausal.  Hypertension This is a chronic problem. The current episode started more than 1 year ago. The problem is controlled. Associated symptoms include shortness of breath. Pertinent negatives include no blurred vision, chest pain, headaches or palpitations. Risk factors for coronary artery disease include diabetes mellitus, dyslipidemia, sedentary lifestyle, smoking/tobacco exposure, post-menopausal state, family history and obesity. Past treatments include ACE inhibitors. Hypertensive end-organ damage includes CVA.    Review of systems  Limited as above.   Objective:    BP 110/82   Pulse 76   Ht 5\' 1"  (1.549 m)   Wt 198 lb 6.4 oz (90 kg)   BMI 37.49 kg/m   Wt Readings from Last 3 Encounters:  07/16/23 198 lb 6.4 oz (90 kg)  03/13/23 203 lb 12.8 oz (92.4 kg)  10/04/22 213 lb 6.4 oz (96.8 kg)     Physical Exam- Limited  Constitutional:  Body mass index is 37.49 kg/m. , not in acute distress, normal state of mind   CMP     Component Value Date/Time   NA 143 07/10/2023 1346   K 4.9 07/10/2023 1346   CL 98 07/10/2023 1346   CO2 29 07/10/2023 1346   GLUCOSE 97 07/10/2023 1346   GLUCOSE 160 (H) 02/11/2020 0727   BUN 15 07/10/2023 1346   CREATININE 0.76 07/10/2023 1346   CREATININE 0.89 02/11/2020 0727   CALCIUM 10.2 07/10/2023 1346   PROT 6.6 07/10/2023 1346   ALBUMIN 4.2 07/10/2023 1346    AST 19 07/10/2023 1346   ALT 16 07/10/2023 1346   ALKPHOS 73 07/10/2023 1346   BILITOT 0.4 07/10/2023 1346   GFRNONAA 68 02/11/2020 0727   GFRAA 79 02/11/2020 0727     Diabetic Labs (most recent): Lab Results  Component Value Date   HGBA1C 7.1 (A) 07/16/2023   HGBA1C 6.5 10/04/2022   HGBA1C 6.2 05/02/2022   MICROALBUR 30 05/26/2021   MICROALBUR 1.1 02/11/2020   MICROALBUR 22.8 11/27/2019  Lipid Panel     Component Value Date/Time   CHOL 135 09/27/2022 0846   TRIG 164 (H) 09/27/2022 0846   HDL 36 (L) 09/27/2022 0846   CHOLHDL 3.8 09/27/2022 0846   CHOLHDL 3.3 02/11/2020 0727   LDLCALC 71 09/27/2022 0846   LDLCALC 68 02/11/2020 0727      Assessment & Plan:   1. Uncontrolled type 2 diabetes mellitus with other circulatory complication, without long-term current use of insulin (HCC)   - Patient has currently improving  type 2 DM since  68 years of age.  She presents with with her CGM showing controlled glycemic profile averaging 126, however her point-of-care A1c is increasing to 7.1% from 6.5%.  Her AGP report shows 94% time range, 4% level 1 hyperglycemia.  2% Level One hypoglycemia.  She has tolerated starting dose of Ozempic.  - Recent labs reviewed.   Her diabetes is complicated by TIA, chronic heavy smoking and patient remains at a high risk for more acute and chronic complications of diabetes which include CAD, CVA, CKD, retinopathy, and neuropathy. These are all discussed in detail with the patient.  - she acknowledges that there is a room for improvement in her food and drink choices. - Suggestion is made for her to avoid simple carbohydrates  from her diet including Cakes, Sweet Desserts, Ice Cream, Soda (diet and regular), Sweet Tea, Candies, Chips, Cookies, Store Bought Juices, Alcohol , Artificial Sweeteners,  Coffee Creamer, and "Sugar-free" Products, Lemonade. This will help patient to have more stable blood glucose profile and potentially avoid unintended  weight gain.  The following Lifestyle Medicine recommendations according to American College of Lifestyle Medicine  [facility]) were discussed and and offered to patient and she  agrees to start the journey:  A. Whole Foods, Plant-Based Nutrition comprising of fruits and vegetables, plant-based proteins, whole-grain carbohydrates was discussed in detail with the patient.   A list for source of those nutrients were also provided to the patient.  Patient will use only water or unsweetened tea for hydration. B.  The need to stay away from risky substances including alcohol, smoking; obtaining 7 to 9 hours of restorative sleep, at least 150 minutes of moderate intensity exercise weekly, the importance of healthy social connections,  and stress management techniques were discussed. C.  A full color page of  Calorie density of various food groups per pound showing examples of each food groups was provided to the patient.   - I have approached patient with the following individualized plan to manage diabetes and patient agrees:   Based on her presentation with tightening glycemic profile, she will be considered for lower dose of insulin.   I advised her to lower her Toujeo to 30 units nightly, advised to continue to utilize her CGM.   -She has tolerated the starting dose of Ozempic, discussed and increased her Ozempic to 0.5 mg subcutaneously weekly.  This medication will be advanced further as she tolerates.  Side effects and precautions discussed with her. -She is advised to continue metformin 850 mg by mouth twice a day, therapeutically suitable for patient.  She is encouraged to call clinic for hypoglycemia below 70 or hyperglycemia greater than 200 mg per DL.    - Patient specific target  A1c;  LDL, HDL, Triglycerides,  were discussed in detail. She is advised to quit smoking.    2) BP/HTN:  Her blood pressure is controlled to target.  She is advised to continue her current blood pressure  medications  including lisinopril 20 mg p.o. daily.    3) Lipids/HPL: Her recent lipid panel showed controlled LDL at 71.  She is advised to continue atorvastatin 20 mg p.o. nightly.     Side effects and precautions discussed with her.    4)  Weight/Diet: Her BMI is 37.49-- progressively losing weight.   This still is clearly complicating her diabetes care.  She is a candidate for modest weight loss.  She did not have any further success in weight loss, she still admits significant  dietary indiscretion, CDE Consult  Is in progress  , exercise, and detailed carbohydrates information provided. Possible plant-based diet discussed above will help with her chronic comorbidities including obesity.  6) Chronic Care/Health Maintenance:  -Patient is on ACEI and Statin medications and encouraged to continue to follow up with Ophthalmology, Podiatrist at least yearly or according to recommendations, and advised to  quit smoking. I have recommended yearly flu vaccine and pneumonia vaccination at least every 5 years; moderate intensity exercise for up to 150 minutes weekly; and  sleep for at least 7 hours a day.  The patient was counseled on the dangers of tobacco use, and was advised to quit.  Reviewed strategies to maximize success, including removing cigarettes and smoking materials from environment.   She had normal ABI on September 01, 2020, this study will be repeated in March 2027 or sooner if needed.   - I advised patient to maintain close follow up with Burdine, Ananias Pilgrim, MD for primary care needs.    I spent  26  minutes in the care of the patient today including review of labs from CMP, Lipids, Thyroid Function, Hematology (current and previous including abstractions from other facilities); face-to-face time discussing  her blood glucose readings/logs, discussing hypoglycemia and hyperglycemia episodes and symptoms, medications doses, her options of short and long term treatment based on the latest  standards of care / guidelines;  discussion about incorporating lifestyle medicine;  and documenting the encounter. Risk reduction counseling performed per USPSTF guidelines to reduce  obesity and cardiovascular risk factors.     Please refer to Patient Instructions for Blood Glucose Monitoring and Insulin/Medications Dosing Guide"  in media tab for additional information. Please  also refer to " Patient Self Inventory" in the Media  tab for reviewed elements of pertinent patient history.  Lurlean Horns participated in the discussions, expressed understanding, and voiced agreement with the above plans.  All questions were answered to her satisfaction. she is encouraged to contact clinic should she have any questions or concerns prior to her return visit.     Follow up plan: - Return in about 4 months (around 11/13/2023) for Bring Meter/CGM Device/Logs- A1c in Office.  Marquis Lunch, MD Phone: [phone]  Fax: [phone]   -  This note was partially dictated with voice recognition software. Similar sounding words can be transcribed inadequately or may not  be corrected upon review.  07/16/2023, 12:28 PM

## 2023-07-16 NOTE — Patient Instructions (Signed)

## 2023-07-20 DIAGNOSIS — Z1329 Encounter for screening for other suspected endocrine disorder: Secondary | ICD-10-CM | POA: Diagnosis not present

## 2023-07-20 DIAGNOSIS — E1165 Type 2 diabetes mellitus with hyperglycemia: Secondary | ICD-10-CM | POA: Diagnosis not present

## 2023-07-20 DIAGNOSIS — R5383 Other fatigue: Secondary | ICD-10-CM | POA: Diagnosis not present

## 2023-07-24 DIAGNOSIS — G4733 Obstructive sleep apnea (adult) (pediatric): Secondary | ICD-10-CM | POA: Diagnosis not present

## 2023-07-24 DIAGNOSIS — J449 Chronic obstructive pulmonary disease, unspecified: Secondary | ICD-10-CM | POA: Diagnosis not present

## 2023-07-24 DIAGNOSIS — E1169 Type 2 diabetes mellitus with other specified complication: Secondary | ICD-10-CM | POA: Diagnosis not present

## 2023-07-24 DIAGNOSIS — J439 Emphysema, unspecified: Secondary | ICD-10-CM | POA: Diagnosis not present

## 2023-08-08 DIAGNOSIS — E1169 Type 2 diabetes mellitus with other specified complication: Secondary | ICD-10-CM | POA: Diagnosis not present

## 2023-08-21 ENCOUNTER — Telehealth: Payer: Self-pay | Admitting: "Endocrinology

## 2023-08-21 NOTE — Telephone Encounter (Signed)
 Pts libre reader has stopped working, needs to know what to do.

## 2023-08-22 NOTE — Telephone Encounter (Signed)
 Tried to call pt but did not receive an answer and was unable to leave a message due to pt's voicemail being full.

## 2023-08-22 NOTE — Telephone Encounter (Signed)
 Pt said company is sending her new device

## 2023-08-24 DIAGNOSIS — S93492A Sprain of other ligament of left ankle, initial encounter: Secondary | ICD-10-CM | POA: Diagnosis not present

## 2023-09-05 DIAGNOSIS — E1169 Type 2 diabetes mellitus with other specified complication: Secondary | ICD-10-CM | POA: Diagnosis not present

## 2023-09-22 ENCOUNTER — Other Ambulatory Visit: Payer: Self-pay | Admitting: "Endocrinology

## 2023-09-22 DIAGNOSIS — Z794 Long term (current) use of insulin: Secondary | ICD-10-CM

## 2023-10-06 DIAGNOSIS — E1169 Type 2 diabetes mellitus with other specified complication: Secondary | ICD-10-CM | POA: Diagnosis not present

## 2023-10-11 DIAGNOSIS — E1165 Type 2 diabetes mellitus with hyperglycemia: Secondary | ICD-10-CM | POA: Diagnosis not present

## 2023-10-11 DIAGNOSIS — E1169 Type 2 diabetes mellitus with other specified complication: Secondary | ICD-10-CM | POA: Diagnosis not present

## 2023-10-11 DIAGNOSIS — R5382 Chronic fatigue, unspecified: Secondary | ICD-10-CM | POA: Diagnosis not present

## 2023-10-11 DIAGNOSIS — R7989 Other specified abnormal findings of blood chemistry: Secondary | ICD-10-CM | POA: Diagnosis not present

## 2023-10-11 DIAGNOSIS — Z13220 Encounter for screening for lipoid disorders: Secondary | ICD-10-CM | POA: Diagnosis not present

## 2023-10-17 DIAGNOSIS — I10 Essential (primary) hypertension: Secondary | ICD-10-CM | POA: Diagnosis not present

## 2023-10-17 DIAGNOSIS — M48061 Spinal stenosis, lumbar region without neurogenic claudication: Secondary | ICD-10-CM | POA: Diagnosis not present

## 2023-10-17 DIAGNOSIS — E1169 Type 2 diabetes mellitus with other specified complication: Secondary | ICD-10-CM | POA: Diagnosis not present

## 2023-10-17 DIAGNOSIS — G4733 Obstructive sleep apnea (adult) (pediatric): Secondary | ICD-10-CM | POA: Diagnosis not present

## 2023-10-22 DIAGNOSIS — S93492A Sprain of other ligament of left ankle, initial encounter: Secondary | ICD-10-CM | POA: Diagnosis not present

## 2023-11-01 DIAGNOSIS — S93492D Sprain of other ligament of left ankle, subsequent encounter: Secondary | ICD-10-CM | POA: Diagnosis not present

## 2023-11-04 ENCOUNTER — Other Ambulatory Visit: Payer: Self-pay | Admitting: "Endocrinology

## 2023-11-05 DIAGNOSIS — E1169 Type 2 diabetes mellitus with other specified complication: Secondary | ICD-10-CM | POA: Diagnosis not present

## 2023-11-13 ENCOUNTER — Ambulatory Visit (INDEPENDENT_AMBULATORY_CARE_PROVIDER_SITE_OTHER): Payer: 59 | Admitting: "Endocrinology

## 2023-11-13 ENCOUNTER — Encounter: Payer: Self-pay | Admitting: "Endocrinology

## 2023-11-13 VITALS — BP 126/78 | HR 96 | Ht 61.0 in | Wt 186.8 lb

## 2023-11-13 DIAGNOSIS — Z794 Long term (current) use of insulin: Secondary | ICD-10-CM

## 2023-11-13 DIAGNOSIS — E782 Mixed hyperlipidemia: Secondary | ICD-10-CM | POA: Diagnosis not present

## 2023-11-13 DIAGNOSIS — I10 Essential (primary) hypertension: Secondary | ICD-10-CM | POA: Diagnosis not present

## 2023-11-13 DIAGNOSIS — E1169 Type 2 diabetes mellitus with other specified complication: Secondary | ICD-10-CM

## 2023-11-13 DIAGNOSIS — Z6835 Body mass index (BMI) 35.0-35.9, adult: Secondary | ICD-10-CM

## 2023-11-13 DIAGNOSIS — E66812 Obesity, class 2: Secondary | ICD-10-CM | POA: Insufficient documentation

## 2023-11-13 LAB — POCT GLYCOSYLATED HEMOGLOBIN (HGB A1C): HbA1c, POC (controlled diabetic range): 6.9 % (ref 0.0–7.0)

## 2023-11-13 MED ORDER — SEMAGLUTIDE (1 MG/DOSE) 4 MG/3ML ~~LOC~~ SOPN: 1.0000 mg | PEN_INJECTOR | SUBCUTANEOUS | 1 refills | Status: DC

## 2023-11-13 MED ORDER — TOUJEO SOLOSTAR 300 UNIT/ML ~~LOC~~ SOPN: 20.0000 [IU] | PEN_INJECTOR | Freq: Every day | SUBCUTANEOUS | 0 refills | Status: DC

## 2023-11-13 NOTE — Progress Notes (Signed)
 11/13/2023   Endocrinology follow-up note   Subjective:    Patient ID: Victoria Lara, female    DOB: 03-Apr-1956.  She is here in follow-up for the management of symptomatic type 2 diabetes, hyperlipidemia, hypertension.    PMD:   Alston Jerry, MD  Past Medical History:  Diagnosis Date   Anemia    Anxiety    Arthritis    Bronchitis    Complication of anesthesia    had to come back to ER after shoulder surgery at [facility]; hard to wake up; low O2 sats   Depression    Diabetes mellitus without complication (HCC)    GERD (gastroesophageal reflux disease)    Hyperlipidemia    Hypertension    Neuropathy    Takes Gabapentin    Pneumonia 2016   Restless leg syndrome    Seasonal allergies    Shortness of breath dyspnea    Sleep apnea    Syncope    TIA (transient ischemic attack) 2006   Past Surgical History:  Procedure Laterality Date   BREAST LUMPECTOMY Bilateral    CATARACT EXTRACTION W/PHACO Right 08/20/2014   Procedure: CATARACT EXTRACTION PHACO AND INTRAOCULAR LENS PLACEMENT (IOC);  Surgeon: Anner Kill, MD;  Location: [facility];  Service: Ophthalmology;  Laterality: Right;  CDE:5.31   CATARACT EXTRACTION W/PHACO Left 01/19/2020   Procedure: CATARACT EXTRACTION PHACO AND INTRAOCULAR LENS PLACEMENT (IOC);  Surgeon: Tarri Farm, MD;  Location: [facility];  Service: Ophthalmology;  Laterality: Left;  CDE: 12.32   HAND SURGERY Bilateral    NASAL SEPTOPLASTY W/ TURBINOPLASTY Bilateral 01/26/2016   Procedure: NASAL SEPTOPLASTY WITH BILATERAL TURBINATE REDUCTION;  Surgeon: Reynold Caves, MD;  Location: [facility];  Service: ENT;  Laterality: Bilateral;   NASAL SEPTUM SURGERY  01/26/2016   NASAL SINUS SURGERY Bilateral 01/26/2016   Procedure: BILATERAL ENDOSCOPIC MAXILLARY ANTROSTOMY;  Surgeon: Reynold Caves, MD;  Location: [facility];  Service: ENT;  Laterality: Bilateral;   SHOULDER ARTHROSCOPY WITH ROTATOR CUFF REPAIR Right    TUBAL LIGATION     Social History   Socioeconomic History   Marital  status: Widowed    Spouse name: Not on file   Number of children: 1   Years of education: 80   Highest education level: Not on file  Occupational History   Occupation: Unemployed  Tobacco Use   Smoking status: Some Days    Current packs/day: 0.10    Average packs/day: 0.1 packs/day for 44.0 years (4.4 ttl pk-yrs)    Types: Cigarettes   Smokeless tobacco: Never  Vaping Use   Vaping status: Never Used  Substance and Sexual Activity   Alcohol use: No    Alcohol/week: 0.0 standard drinks of alcohol   Drug use: No   Sexual activity: Not on file  Other Topics Concern   Not on file  Social History Narrative   Lives with boyfriend   Caffeine use: Drinks 1 soda a day    Social Drivers of Corporate investment banker Strain: Not on file  Food Insecurity: Not on file  Transportation Needs: Not on file  Physical Activity: Not on file  Stress: Not on file  Social Connections: Not on file   Outpatient Encounter Medications as of 11/13/2023  Medication Sig   Semaglutide , 1 MG/DOSE, 4 MG/3ML SOPN Inject 1 mg as directed once a week.   ACCU-CHEK AVIVA PLUS test strip USE TWICE DAILY TO CHECK SUGAR   ACCU-CHEK GUIDE test strip USE ONE STRIP TO TEST BLOOD SUGAR TWICE  DAILY   Accu-Chek Softclix Lancets lancets USE TO CHECK BLOOD SUGAR TWICE DAILY   acetaminophen  (TYLENOL ) 500 MG tablet Take 1,500 mg by mouth 2 (two) times daily as needed for moderate pain or headache.   albuterol  (PROVENTIL ) (2.5 MG/3ML) 0.083% nebulizer solution Take 3 mLs (2.5 mg total) by nebulization every 6 (six) hours as needed for wheezing.   aspirin  EC 81 MG tablet Take 81 mg by mouth daily.   atorvastatin  (LIPITOR) 20 MG tablet TAKE ONE TABLET BY MOUTH DAILY (Patient taking differently: Take 20 mg by mouth daily. )   Blood Glucose Monitoring Suppl (ACCU-CHEK AVIVA) device Use as instructed   buPROPion  (WELLBUTRIN  SR) 150 MG 12 hr tablet Take 150 mg by mouth 2 (two) times daily.   cetirizine (ZYRTEC) 10 MG tablet  Take 10 mg by mouth daily.   citalopram  (CELEXA ) 20 MG tablet Take 40 mg by mouth daily.   clonazePAM  (KLONOPIN ) 0.5 MG tablet Take 0.5 mg by mouth every 8 (eight) hours as needed for anxiety.    Continuous Glucose Receiver (FREESTYLE LIBRE 2 READER) DEVI As directed   Continuous Glucose Sensor (FREESTYLE LIBRE 2 SENSOR) MISC USE AS DIRECTED EVERY 14 DAYS   EASY COMFORT PEN NEEDLES 31G X 6 MM MISC USE AS DIRECTED ONCE DAILY.   fluticasone  (FLONASE ) 50 MCG/ACT nasal spray Place 1 spray into both nostrils daily.   gabapentin  (NEURONTIN ) 300 MG capsule Take 300 mg by mouth 2 (two) times daily.   glucose blood (ACCU-CHEK AVIVA) test strip To test 4 times a day   hydrochlorothiazide  (HYDRODIURIL ) 25 MG tablet Take 25 mg by mouth daily.   insulin  glargine, 1 Unit Dial, (TOUJEO  SOLOSTAR) 300 UNIT/ML Solostar Pen Inject 20 Units into the skin at bedtime.   metFORMIN  (GLUCOPHAGE ) 850 MG tablet Take 850 mg by mouth 2 (two) times daily with a meal.   montelukast  (SINGULAIR ) 10 MG tablet Take 10 mg by mouth at bedtime.   omeprazole (PRILOSEC) 20 MG capsule Take 20 mg by mouth daily.   PROAIR  HFA 108 (90 Base) MCG/ACT inhaler Inhale 2 puffs into the lungs every 4 (four) hours as needed for wheezing or shortness of breath.    rOPINIRole  (REQUIP ) 0.25 MG tablet Take 0.25 mg by mouth See admin instructions. Take 0.25 mg at night may take a second 0.25 mg dose as needed for restless legs   TRELEGY ELLIPTA 100-62.5-25 MCG/ACT AEPB Inhale 1 puff into the lungs daily.   valsartan  (DIOVAN ) 160 MG tablet Take 160 mg by mouth daily.   [DISCONTINUED] insulin  glargine, 1 Unit Dial, (TOUJEO  SOLOSTAR) 300 UNIT/ML Solostar Pen Inject 30 Units into the skin at bedtime.   [DISCONTINUED] Semaglutide ,0.25 or 0.5MG /DOS, (OZEMPIC , 0.25 OR 0.5 MG/DOSE,) 2 MG/3ML SOPN Inject 0.5 mg into the skin once a week.   No facility-administered encounter medications on file as of 11/13/2023.   ALLERGIES: Allergies  Allergen Reactions    Lisinopril  Cough   VACCINATION STATUS: Immunization History  Administered Date(s) Administered   Influenza,inj,Quad PF,6+ Mos 03/26/2018    Diabetes She presents for her follow-up diabetic visit. She has type 2 diabetes mellitus. Onset time: She was diagnosed at approximate age of 44 years. Her disease course has been improving. There are no hypoglycemic associated symptoms. Pertinent negatives for hypoglycemia include no confusion, headaches, pallor or seizures. Pertinent negatives for diabetes include no blurred vision, no chest pain, no fatigue, no polydipsia, no polyphagia and no polyuria. There are no hypoglycemic complications. Symptoms are improving. Diabetic complications include a  CVA. (She is a chronic heavy smoker.) Risk factors for coronary artery disease include diabetes mellitus, dyslipidemia, hypertension, obesity, sedentary lifestyle and tobacco exposure. Current diabetic treatment includes oral agent (monotherapy). Her weight is decreasing steadily. She is following a generally unhealthy diet. When asked about meal planning, she reported none. She rarely participates in exercise. Her home blood glucose trend is decreasing rapidly. Her breakfast blood glucose range is generally 110-130 mg/dl. Her lunch blood glucose range is generally 130-140 mg/dl. Her dinner blood glucose range is generally 130-140 mg/dl. Her bedtime blood glucose range is generally 130-140 mg/dl. Her overall blood glucose range is 130-140 mg/dl. (She presents with with her CGM showing continued improvement in her glycemic profile averaging 138 mg per DL over the most recent 14 days.  Her AGP report shows 95% time range, 5% Level One hyperglycemia.  She has no hypoglycemia.  Her point-of-care A1c is 6.9%.  She is tolerating Ozempic  low-dose basal insulin  and metformin .   ) An ACE inhibitor/angiotensin II receptor blocker is being taken. Eye exam is current.  Hyperlipidemia This is a chronic problem. The current episode  started more than 1 year ago. The problem is controlled. Recent lipid tests were reviewed and are high. Exacerbating diseases include diabetes and obesity. Associated symptoms include shortness of breath. Pertinent negatives include no chest pain or myalgias. Current antihyperlipidemic treatment includes statins. Risk factors for coronary artery disease include diabetes mellitus, dyslipidemia, hypertension, a sedentary lifestyle, obesity and post-menopausal.  Hypertension This is a chronic problem. The current episode started more than 1 year ago. The problem is controlled. Associated symptoms include shortness of breath. Pertinent negatives include no blurred vision, chest pain, headaches or palpitations. Risk factors for coronary artery disease include diabetes mellitus, dyslipidemia, sedentary lifestyle, smoking/tobacco exposure, post-menopausal state, family history and obesity. Past treatments include ACE inhibitors. Hypertensive end-organ damage includes CVA.    Review of systems  Limited as above.   Objective:    BP 126/78   Pulse 96   Ht 5\' 1"  (1.549 m)   Wt 186 lb 12.8 oz (84.7 kg)   BMI 35.30 kg/m   Wt Readings from Last 3 Encounters:  11/13/23 186 lb 12.8 oz (84.7 kg)  07/16/23 198 lb 6.4 oz (90 kg)  03/13/23 203 lb 12.8 oz (92.4 kg)     Physical Exam- Limited  Constitutional:  Body mass index is 35.3 kg/m. , not in acute distress, normal state of mind   CMP     Component Value Date/Time   NA 143 07/10/2023 1346   K 4.9 07/10/2023 1346   CL 98 07/10/2023 1346   CO2 29 07/10/2023 1346   GLUCOSE 97 07/10/2023 1346   GLUCOSE 160 (H) 02/11/2020 0727   BUN 15 07/10/2023 1346   CREATININE 0.76 07/10/2023 1346   CREATININE 0.89 02/11/2020 0727   CALCIUM  10.2 07/10/2023 1346   PROT 6.6 07/10/2023 1346   ALBUMIN 4.2 07/10/2023 1346   AST 19 07/10/2023 1346   ALT 16 07/10/2023 1346   ALKPHOS 73 07/10/2023 1346   BILITOT 0.4 07/10/2023 1346   GFRNONAA 68 02/11/2020  0727   GFRAA 79 02/11/2020 0727     Diabetic Labs (most recent): Lab Results  Component Value Date   HGBA1C 6.9 11/13/2023   HGBA1C 7.1 (A) 07/16/2023   HGBA1C 6.5 10/04/2022   MICROALBUR 30 05/26/2021   MICROALBUR 1.1 02/11/2020   MICROALBUR 22.8 11/27/2019   Lipid Panel     Component Value Date/Time   CHOL 135 09/27/2022  0846   TRIG 164 (H) 09/27/2022 0846   HDL 36 (L) 09/27/2022 0846   CHOLHDL 3.8 09/27/2022 0846   CHOLHDL 3.3 02/11/2020 0727   LDLCALC 71 09/27/2022 0846   LDLCALC 68 02/11/2020 0727      Assessment & Plan:   1. Uncontrolled type 2 diabetes mellitus with other circulatory complication, without long-term current use of insulin  [facility])   - Patient has currently improving  type 2 DM since  68 years of age.  She presents with with her CGM showing continued improvement in her glycemic profile averaging 138 mg per DL over the most recent 14 days.  Her AGP report shows 95% time range, 5% Level One hyperglycemia.  She has no hypoglycemia.  Her point-of-care A1c is 6.9%.  She is tolerating Ozempic  low-dose basal insulin  and metformin .    - Recent labs reviewed.   Her diabetes is complicated by TIA, chronic heavy smoking and patient remains at a high risk for more acute and chronic complications of diabetes which include CAD, CVA, CKD, retinopathy, and neuropathy. These are all discussed in detail with the patient.  - she acknowledges that there is a room for improvement in her food and drink choices. - Suggestion is made for her to avoid simple carbohydrates  from her diet including Cakes, Sweet Desserts, Ice Cream, Soda (diet and regular), Sweet Tea, Candies, Chips, Cookies, Store Bought Juices, Alcohol , Artificial Sweeteners,  Coffee Creamer, and "Sugar-free" Products, Lemonade. This will help patient to have more stable blood glucose profile and potentially avoid unintended weight gain.  The following Lifestyle Medicine recommendations according to American  College of Lifestyle Medicine  [facility]) were discussed and and offered to patient and she  agrees to start the journey:  A. Whole Foods, Plant-Based Nutrition comprising of fruits and vegetables, plant-based proteins, whole-grain carbohydrates was discussed in detail with the patient.   A list for source of those nutrients were also provided to the patient.  Patient will use only water or unsweetened tea for hydration. B.  The need to stay away from risky substances including alcohol, smoking; obtaining 7 to 9 hours of restorative sleep, at least 150 minutes of moderate intensity exercise weekly, the importance of healthy social connections,  and stress management techniques were discussed. C.  A full color page of  Calorie density of various food groups per pound showing examples of each food groups was provided to the patient.   - I have approached patient with the following individualized plan to manage diabetes and patient agrees:   Based on her presentation with tightening glycemic profile, she will be considered for lower dose of insulin .   She is advised to lower her Toujeo  to 20 units nightly advised to utilize her CGM continuously.   -She has tolerated the starting dose of Ozempic , I discussed and increased her Ozempic  to 1 mg subcutaneously weekly.   This medication will be advanced further as she tolerates.  Side effects and precautions discussed with her. -She is advised to continue metformin  850 mg by mouth twice a day, therapeutically suitable for patient.  She is encouraged to call clinic for hypoglycemia below 70 or hyperglycemia greater than 200 mg per DL.    - Patient specific target  A1c;  LDL, HDL, Triglycerides,  were discussed in detail. She is advised to quit smoking.    2) BP/HTN:  Her blood pressure is controlled to target.  She is advised to continue her current blood pressure medications including lisinopril   20 mg p.o. daily.    3) Lipids/HPL: Her recent lipid panel  showed controlled LDL at 71.  She is advised to continue atorvastatin  20 mg p.o. nightly.      Side effects and precautions discussed with her.    4)  Weight/Diet: Her BMI is 35.30-- progressively losing weight.   This still is clearly complicating her diabetes care.  She is a candidate for modest weight loss.  She did not have any further success in weight loss, she still admits significant  dietary indiscretion, CDE Consult  Is in progress  , exercise, and detailed carbohydrates information provided. Possible plant-based diet discussed above will help with her chronic comorbidities including obesity.  6) Chronic Care/Health Maintenance:  -Patient is on ACEI and Statin medications and encouraged to continue to follow up with Ophthalmology, Podiatrist at least yearly or according to recommendations, and advised to  quit smoking. I have recommended yearly flu vaccine and pneumonia vaccination at least every 5 years; moderate intensity exercise for up to 150 minutes weekly; and  sleep for at least 7 hours a day.  The patient was counseled on the dangers of tobacco use, and was advised to quit.  Reviewed strategies to maximize success, including removing cigarettes and smoking materials from environment.   She had normal ABI on September 01, 2020, this study will be repeated in March 2027 or sooner if needed.   - I advised patient to maintain close follow up with Burdine, Steven E, MD for primary care needs.   I spent  26  minutes in the care of the patient today including review of labs from CMP, Lipids, Thyroid  Function, Hematology (current and previous including abstractions from other facilities); face-to-face time discussing  her blood glucose readings/logs, discussing hypoglycemia and hyperglycemia episodes and symptoms, medications doses, her options of short and long term treatment based on the latest standards of care / guidelines;  discussion about incorporating lifestyle medicine;  and  documenting the encounter. Risk reduction counseling performed per USPSTF guidelines to reduce  obesity and cardiovascular risk factors.     Please refer to Patient Instructions for Blood Glucose Monitoring and Insulin /Medications Dosing Guide"  in media tab for additional information. Please  also refer to " Patient Self Inventory" in the Media  tab for reviewed elements of pertinent patient history.  Victoria Lara participated in the discussions, expressed understanding, and voiced agreement with the above plans.  All questions were answered to her satisfaction. she is encouraged to contact clinic should she have any questions or concerns prior to her return visit.      Follow up plan: - Return in about 4 months (around 03/15/2024) for F/U with Pre-visit Labs, Meter/CGM/Logs, A1c here.  Kalvin Orf, MD Phone: [phone]  Fax: [phone]   -  This note was partially dictated with voice recognition software. Similar sounding words can be transcribed inadequately or may not  be corrected upon review.  11/13/2023, 10:24 AM

## 2023-11-13 NOTE — Patient Instructions (Signed)

## 2023-11-17 ENCOUNTER — Other Ambulatory Visit: Payer: Self-pay | Admitting: "Endocrinology

## 2023-11-17 DIAGNOSIS — Z794 Long term (current) use of insulin: Secondary | ICD-10-CM

## 2023-11-30 DIAGNOSIS — Z0000 Encounter for general adult medical examination without abnormal findings: Secondary | ICD-10-CM | POA: Diagnosis not present

## 2023-11-30 DIAGNOSIS — S93492D Sprain of other ligament of left ankle, subsequent encounter: Secondary | ICD-10-CM | POA: Diagnosis not present

## 2023-11-30 DIAGNOSIS — Z1389 Encounter for screening for other disorder: Secondary | ICD-10-CM | POA: Diagnosis not present

## 2023-12-04 ENCOUNTER — Other Ambulatory Visit: Payer: Self-pay | Admitting: "Endocrinology

## 2023-12-06 DIAGNOSIS — E1169 Type 2 diabetes mellitus with other specified complication: Secondary | ICD-10-CM | POA: Diagnosis not present

## 2023-12-16 DIAGNOSIS — J449 Chronic obstructive pulmonary disease, unspecified: Secondary | ICD-10-CM | POA: Diagnosis not present

## 2023-12-16 DIAGNOSIS — G2581 Restless legs syndrome: Secondary | ICD-10-CM | POA: Diagnosis not present

## 2023-12-16 DIAGNOSIS — M25512 Pain in left shoulder: Secondary | ICD-10-CM | POA: Diagnosis not present

## 2023-12-16 DIAGNOSIS — F17210 Nicotine dependence, cigarettes, uncomplicated: Secondary | ICD-10-CM | POA: Diagnosis not present

## 2023-12-16 DIAGNOSIS — Z7984 Long term (current) use of oral hypoglycemic drugs: Secondary | ICD-10-CM | POA: Diagnosis not present

## 2023-12-16 DIAGNOSIS — M19012 Primary osteoarthritis, left shoulder: Secondary | ICD-10-CM | POA: Diagnosis not present

## 2023-12-16 DIAGNOSIS — Z888 Allergy status to other drugs, medicaments and biological substances status: Secondary | ICD-10-CM | POA: Diagnosis not present

## 2023-12-16 DIAGNOSIS — Z7982 Long term (current) use of aspirin: Secondary | ICD-10-CM | POA: Diagnosis not present

## 2023-12-16 DIAGNOSIS — E785 Hyperlipidemia, unspecified: Secondary | ICD-10-CM | POA: Diagnosis not present

## 2023-12-16 DIAGNOSIS — I10 Essential (primary) hypertension: Secondary | ICD-10-CM | POA: Diagnosis not present

## 2023-12-16 DIAGNOSIS — G4733 Obstructive sleep apnea (adult) (pediatric): Secondary | ICD-10-CM | POA: Diagnosis not present

## 2023-12-16 DIAGNOSIS — E1140 Type 2 diabetes mellitus with diabetic neuropathy, unspecified: Secondary | ICD-10-CM | POA: Diagnosis not present

## 2023-12-16 DIAGNOSIS — Z794 Long term (current) use of insulin: Secondary | ICD-10-CM | POA: Diagnosis not present

## 2023-12-16 DIAGNOSIS — Z79899 Other long term (current) drug therapy: Secondary | ICD-10-CM | POA: Diagnosis not present

## 2023-12-18 DIAGNOSIS — M542 Cervicalgia: Secondary | ICD-10-CM | POA: Diagnosis not present

## 2023-12-18 DIAGNOSIS — M25512 Pain in left shoulder: Secondary | ICD-10-CM | POA: Diagnosis not present

## 2023-12-30 ENCOUNTER — Other Ambulatory Visit: Payer: Self-pay | Admitting: "Endocrinology

## 2023-12-30 DIAGNOSIS — Z794 Long term (current) use of insulin: Secondary | ICD-10-CM

## 2024-01-01 DIAGNOSIS — E875 Hyperkalemia: Secondary | ICD-10-CM | POA: Diagnosis not present

## 2024-01-01 DIAGNOSIS — E1165 Type 2 diabetes mellitus with hyperglycemia: Secondary | ICD-10-CM | POA: Diagnosis not present

## 2024-01-01 DIAGNOSIS — Z1329 Encounter for screening for other suspected endocrine disorder: Secondary | ICD-10-CM | POA: Diagnosis not present

## 2024-01-02 ENCOUNTER — Other Ambulatory Visit (HOSPITAL_COMMUNITY): Payer: Self-pay | Admitting: Otolaryngology

## 2024-01-02 DIAGNOSIS — M542 Cervicalgia: Secondary | ICD-10-CM | POA: Diagnosis not present

## 2024-01-02 DIAGNOSIS — M25512 Pain in left shoulder: Secondary | ICD-10-CM | POA: Diagnosis not present

## 2024-01-05 DIAGNOSIS — E1169 Type 2 diabetes mellitus with other specified complication: Secondary | ICD-10-CM | POA: Diagnosis not present

## 2024-01-07 DIAGNOSIS — I10 Essential (primary) hypertension: Secondary | ICD-10-CM | POA: Diagnosis not present

## 2024-01-07 DIAGNOSIS — E1169 Type 2 diabetes mellitus with other specified complication: Secondary | ICD-10-CM | POA: Diagnosis not present

## 2024-01-07 DIAGNOSIS — M48061 Spinal stenosis, lumbar region without neurogenic claudication: Secondary | ICD-10-CM | POA: Diagnosis not present

## 2024-01-09 DIAGNOSIS — E1169 Type 2 diabetes mellitus with other specified complication: Secondary | ICD-10-CM | POA: Diagnosis not present

## 2024-01-21 ENCOUNTER — Ambulatory Visit (HOSPITAL_COMMUNITY)
Admission: RE | Admit: 2024-01-21 | Discharge: 2024-01-21 | Disposition: A | Discharge status: Home / Self Care | Source: Ambulatory Visit | Attending: Otolaryngology | Admitting: Otolaryngology

## 2024-01-21 DIAGNOSIS — M47812 Spondylosis without myelopathy or radiculopathy, cervical region: Secondary | ICD-10-CM | POA: Diagnosis not present

## 2024-01-21 DIAGNOSIS — M50221 Other cervical disc displacement at C4-C5 level: Secondary | ICD-10-CM | POA: Diagnosis not present

## 2024-01-21 DIAGNOSIS — M4802 Spinal stenosis, cervical region: Secondary | ICD-10-CM | POA: Diagnosis not present

## 2024-01-21 DIAGNOSIS — M542 Cervicalgia: Secondary | ICD-10-CM | POA: Insufficient documentation

## 2024-01-21 DIAGNOSIS — R609 Edema, unspecified: Secondary | ICD-10-CM | POA: Diagnosis not present

## 2024-02-21 ENCOUNTER — Other Ambulatory Visit: Payer: Self-pay | Admitting: "Endocrinology

## 2024-02-21 DIAGNOSIS — Z794 Long term (current) use of insulin: Secondary | ICD-10-CM

## 2024-03-13 LAB — COMPREHENSIVE METABOLIC PANEL WITH GFR
ALT: 12 IU/L (ref 0–32)
AST: 14 IU/L (ref 0–40)
Albumin: 4.1 g/dL (ref 3.9–4.9)
Alkaline Phosphatase: 76 IU/L (ref 49–135)
BUN/Creatinine Ratio: 24 (ref 12–28)
BUN: 16 mg/dL (ref 8–27)
Bilirubin Total: 0.3 mg/dL (ref 0.0–1.2)
CO2: 24 mmol/L (ref 20–29)
Calcium: 10 mg/dL (ref 8.7–10.3)
Chloride: 100 mmol/L (ref 96–106)
Creatinine, Ser: 0.66 mg/dL (ref 0.57–1.00)
Globulin, Total: 2.5 g/dL (ref 1.5–4.5)
Glucose: 156 mg/dL — ABNORMAL HIGH (ref 70–99)
Potassium: 4.6 mmol/L (ref 3.5–5.2)
Sodium: 140 mmol/L (ref 134–144)
Total Protein: 6.6 g/dL (ref 6.0–8.5)
eGFR: 95 mL/min/1.73 (ref >59)

## 2024-03-13 LAB — T4, FREE: Free T4: 1.09 ng/dL (ref 0.82–1.77)

## 2024-03-13 LAB — LIPID PANEL
Chol/HDL Ratio: 3.4 ratio (ref 0.0–4.4)
Cholesterol, Total: 145 mg/dL (ref 100–199)
HDL: 43 mg/dL (ref >39)
LDL Chol Calc (NIH): 74 mg/dL (ref 0–99)
Triglycerides: 165 mg/dL — ABNORMAL HIGH (ref 0–149)
VLDL Cholesterol Cal: 28 mg/dL (ref 5–40)

## 2024-03-13 LAB — TSH: TSH: 2.03 u[IU]/mL (ref 0.450–4.500)

## 2024-03-19 ENCOUNTER — Encounter: Payer: Self-pay | Admitting: "Endocrinology

## 2024-03-19 ENCOUNTER — Ambulatory Visit (INDEPENDENT_AMBULATORY_CARE_PROVIDER_SITE_OTHER): Admitting: "Endocrinology

## 2024-03-19 VITALS — BP 138/74 | HR 76 | Ht 61.0 in | Wt 174.0 lb

## 2024-03-19 DIAGNOSIS — I10 Essential (primary) hypertension: Secondary | ICD-10-CM | POA: Diagnosis not present

## 2024-03-19 DIAGNOSIS — E6609 Other obesity due to excess calories: Secondary | ICD-10-CM

## 2024-03-19 DIAGNOSIS — E1169 Type 2 diabetes mellitus with other specified complication: Secondary | ICD-10-CM | POA: Diagnosis not present

## 2024-03-19 DIAGNOSIS — E782 Mixed hyperlipidemia: Secondary | ICD-10-CM | POA: Diagnosis not present

## 2024-03-19 DIAGNOSIS — Z794 Long term (current) use of insulin: Secondary | ICD-10-CM | POA: Diagnosis not present

## 2024-03-19 DIAGNOSIS — E66811 Obesity, class 1: Secondary | ICD-10-CM

## 2024-03-19 DIAGNOSIS — Z6832 Body mass index (BMI) 32.0-32.9, adult: Secondary | ICD-10-CM

## 2024-03-19 LAB — POCT GLYCOSYLATED HEMOGLOBIN (HGB A1C): HbA1c, POC (controlled diabetic range): 6.9 % (ref 0.0–7.0)

## 2024-03-19 MED ORDER — SEMAGLUTIDE (2 MG/DOSE) 8 MG/3ML ~~LOC~~ SOPN: 2.0000 mg | PEN_INJECTOR | SUBCUTANEOUS | 1 refills | Status: AC

## 2024-03-19 MED ORDER — FREESTYLE LIBRE 2 READER DEVI: 0 refills | Status: AC

## 2024-03-19 MED ORDER — TOUJEO SOLOSTAR 300 UNIT/ML ~~LOC~~ SOPN: 30.0000 [IU] | PEN_INJECTOR | Freq: Every day | SUBCUTANEOUS | 1 refills | Status: DC

## 2024-03-19 MED ORDER — FREESTYLE LIBRE 2 PLUS SENSOR MISC: 2 refills | Status: DC

## 2024-03-19 NOTE — Progress Notes (Signed)
 03/19/2024   Endocrinology follow-up note   Subjective:    Patient ID: Victoria Lara, female    DOB: 10-17-1955.  She is here in follow-up for the management of symptomatic type 2 diabetes, hyperlipidemia, hypertension.    PMD:   Lari Elspeth BRAVO, MD  Past Medical History:  Diagnosis Date   Anemia    Anxiety    Arthritis    Bronchitis    Complication of anesthesia    had to come back to ER after shoulder surgery at [facility]; hard to wake up; low O2 sats   Depression    Diabetes mellitus without complication (HCC)    GERD (gastroesophageal reflux disease)    Hyperlipidemia    Hypertension    Neuropathy    Takes Gabapentin    Pneumonia 2016   Restless leg syndrome    Seasonal allergies    Shortness of breath dyspnea    Sleep apnea    Syncope    TIA (transient ischemic attack) 2006   Past Surgical History:  Procedure Laterality Date   BREAST LUMPECTOMY Bilateral    CATARACT EXTRACTION W/PHACO Right 08/20/2014   Procedure: CATARACT EXTRACTION PHACO AND INTRAOCULAR LENS PLACEMENT (IOC);  Surgeon: Cherene Mania, MD;  Location: [facility];  Service: Ophthalmology;  Laterality: Right;  CDE:5.31   CATARACT EXTRACTION W/PHACO Left 01/19/2020   Procedure: CATARACT EXTRACTION PHACO AND INTRAOCULAR LENS PLACEMENT (IOC);  Surgeon: Harrie Agent, MD;  Location: [facility];  Service: Ophthalmology;  Laterality: Left;  CDE: 12.32   HAND SURGERY Bilateral    NASAL SEPTOPLASTY W/ TURBINOPLASTY Bilateral 01/26/2016   Procedure: NASAL SEPTOPLASTY WITH BILATERAL TURBINATE REDUCTION;  Surgeon: Daniel Moccasin, MD;  Location: [facility];  Service: ENT;  Laterality: Bilateral;   NASAL SEPTUM SURGERY  01/26/2016   NASAL SINUS SURGERY Bilateral 01/26/2016   Procedure: BILATERAL ENDOSCOPIC MAXILLARY ANTROSTOMY;  Surgeon: Daniel Moccasin, MD;  Location: [facility];  Service: ENT;  Laterality: Bilateral;   SHOULDER ARTHROSCOPY WITH ROTATOR CUFF REPAIR Right    TUBAL LIGATION     Social History   Socioeconomic History   Marital  status: Widowed    Spouse name: Not on file   Number of children: 1   Years of education: 60   Highest education level: Not on file  Occupational History   Occupation: Unemployed  Tobacco Use   Smoking status: Some Days    Current packs/day: 0.10    Average packs/day: 0.1 packs/day for 44.0 years (4.4 ttl pk-yrs)    Types: Cigarettes   Smokeless tobacco: Never  Vaping Use   Vaping status: Never Used  Substance and Sexual Activity   Alcohol use: No    Alcohol/week: 0.0 standard drinks of alcohol   Drug use: No   Sexual activity: Not on file  Other Topics Concern   Not on file  Social History Narrative   Lives with boyfriend   Caffeine use: Drinks 1 soda a day    Social Drivers of Corporate investment banker Strain: Not on file  Food Insecurity: Not on file  Transportation Needs: Not on file  Physical Activity: Not on file  Stress: Not on file  Social Connections: Not on file   Outpatient Encounter Medications as of 03/19/2024  Medication Sig   amoxicillin -clavulanate (AUGMENTIN) 500-125 MG tablet Take 1 tablet by mouth 2 (two) times daily.   Semaglutide , 2 MG/DOSE, 8 MG/3ML SOPN Inject 2 mg as directed once a week.   [DISCONTINUED] TOUJEO  SOLOSTAR 300 UNIT/ML Solostar Pen INJECT 20  UNITS SUBCUTANEOUSLY AT BEDTIME (Patient taking differently: 30 Units at bedtime.)   ACCU-CHEK AVIVA PLUS test strip USE TWICE DAILY TO CHECK SUGAR   ACCU-CHEK GUIDE test strip USE ONE STRIP TO TEST BLOOD SUGAR TWICE DAILY   Accu-Chek Softclix Lancets lancets USE TO CHECK BLOOD SUGAR TWICE DAILY   acetaminophen  (TYLENOL ) 500 MG tablet Take 1,500 mg by mouth 2 (two) times daily as needed for moderate pain or headache.   albuterol  (PROVENTIL ) (2.5 MG/3ML) 0.083% nebulizer solution Take 3 mLs (2.5 mg total) by nebulization every 6 (six) hours as needed for wheezing.   aspirin  EC 81 MG tablet Take 81 mg by mouth daily.   atorvastatin  (LIPITOR) 20 MG tablet TAKE ONE TABLET BY MOUTH DAILY (Patient  taking differently: Take 20 mg by mouth daily. )   Blood Glucose Monitoring Suppl (ACCU-CHEK AVIVA) device Use as instructed   buPROPion  (WELLBUTRIN  SR) 150 MG 12 hr tablet Take 150 mg by mouth 2 (two) times daily.   cetirizine (ZYRTEC) 10 MG tablet Take 10 mg by mouth daily.   citalopram  (CELEXA ) 20 MG tablet Take 40 mg by mouth daily.   clonazePAM  (KLONOPIN ) 0.5 MG tablet Take 0.5 mg by mouth every 8 (eight) hours as needed for anxiety.    Continuous Glucose Receiver (FREESTYLE LIBRE 2 READER) DEVI As directed   Continuous Glucose Sensor (FREESTYLE LIBRE 2 SENSOR) MISC USE AS DIRECTED EVERY 14 DAYS   EASY COMFORT PEN NEEDLES 31G X 6 MM MISC USE AS DIRECTED ONCE DAILY.   fluticasone  (FLONASE ) 50 MCG/ACT nasal spray Place 1 spray into both nostrils daily.   gabapentin  (NEURONTIN ) 300 MG capsule Take 300 mg by mouth 2 (two) times daily.   glucose blood (ACCU-CHEK AVIVA) test strip To test 4 times a day   hydrochlorothiazide  (HYDRODIURIL ) 25 MG tablet Take 25 mg by mouth daily.   insulin  glargine, 1 Unit Dial, (TOUJEO  SOLOSTAR) 300 UNIT/ML Solostar Pen Inject 30 Units into the skin at bedtime.   metFORMIN  (GLUCOPHAGE ) 850 MG tablet Take 850 mg by mouth 2 (two) times daily with a meal.   montelukast  (SINGULAIR ) 10 MG tablet Take 10 mg by mouth at bedtime.   omeprazole (PRILOSEC) 20 MG capsule Take 20 mg by mouth daily.   PROAIR  HFA 108 (90 Base) MCG/ACT inhaler Inhale 2 puffs into the lungs every 4 (four) hours as needed for wheezing or shortness of breath.    rOPINIRole  (REQUIP ) 0.25 MG tablet Take 0.25 mg by mouth See admin instructions. Take 0.25 mg at night may take a second 0.25 mg dose as needed for restless legs   TRELEGY ELLIPTA 100-62.5-25 MCG/ACT AEPB Inhale 1 puff into the lungs daily.   valsartan  (DIOVAN ) 160 MG tablet Take 160 mg by mouth daily.   [DISCONTINUED] OZEMPIC , 1 MG/DOSE, 4 MG/3ML SOPN INJECT 1 PEN SUBCUTANEOUSLY ONCE A WEEK   No facility-administered encounter medications  on file as of 03/19/2024.   ALLERGIES: Allergies  Allergen Reactions   Lisinopril  Cough   VACCINATION STATUS: Immunization History  Administered Date(s) Administered   Influenza,inj,Quad PF,6+ Mos 03/26/2018    Diabetes She presents for her follow-up diabetic visit. She has type 2 diabetes mellitus. Onset time: She was diagnosed at approximate age of 44 years. Her disease course has been stable. There are no hypoglycemic associated symptoms. Pertinent negatives for hypoglycemia include no confusion, headaches, pallor or seizures. Pertinent negatives for diabetes include no blurred vision, no chest pain, no fatigue, no polydipsia, no polyphagia and no polyuria. There are no  hypoglycemic complications. Symptoms are improving. Diabetic complications include a CVA. (She is a chronic heavy smoker.) Risk factors for coronary artery disease include diabetes mellitus, dyslipidemia, hypertension, obesity, sedentary lifestyle and tobacco exposure. Current diabetic treatment includes oral agent (monotherapy). Her weight is decreasing steadily. She is following a generally unhealthy diet. When asked about meal planning, she reported none. She rarely participates in exercise. Her home blood glucose trend is decreasing steadily. Her breakfast blood glucose range is generally 130-140 mg/dl. Her lunch blood glucose range is generally 130-140 mg/dl. Her dinner blood glucose range is generally 130-140 mg/dl. Her bedtime blood glucose range is generally 130-140 mg/dl. Her overall blood glucose range is 130-140 mg/dl. (She presents with continued improvement in her glycemic profile.  Her freestyle libre device AGP shows 91% time in range, 9% level 1 hyperglycemia.  For point-of-care A1c is 6.9%, stay stable.  She has no hypoglycemia.  ) An ACE inhibitor/angiotensin II receptor blocker is being taken. Eye exam is current.  Hyperlipidemia This is a chronic problem. The current episode started more than 1 year ago. The  problem is controlled. Recent lipid tests were reviewed and are high. Exacerbating diseases include diabetes and obesity. Associated symptoms include shortness of breath. Pertinent negatives include no chest pain or myalgias. Current antihyperlipidemic treatment includes statins. Risk factors for coronary artery disease include diabetes mellitus, dyslipidemia, hypertension, a sedentary lifestyle, obesity and post-menopausal.  Hypertension This is a chronic problem. The current episode started more than 1 year ago. The problem is controlled. Associated symptoms include shortness of breath. Pertinent negatives include no blurred vision, chest pain, headaches or palpitations. Risk factors for coronary artery disease include diabetes mellitus, dyslipidemia, sedentary lifestyle, smoking/tobacco exposure, post-menopausal state, family history and obesity. Past treatments include ACE inhibitors. Hypertensive end-organ damage includes CVA.    Review of systems  Limited as above.   Objective:    BP 138/74   Pulse 76   Ht 5' 1 (1.549 m)   Wt 174 lb (78.9 kg)   BMI 32.88 kg/m   Wt Readings from Last 3 Encounters:  03/19/24 174 lb (78.9 kg)  11/13/23 186 lb 12.8 oz (84.7 kg)  07/16/23 198 lb 6.4 oz (90 kg)     Physical Exam- Limited  Constitutional:  Body mass index is 32.88 kg/m. , not in acute distress, normal state of mind   CMP     Component Value Date/Time   NA 140 03/12/2024 0918   K 4.6 03/12/2024 0918   CL 100 03/12/2024 0918   CO2 24 03/12/2024 0918   GLUCOSE 156 (H) 03/12/2024 0918   GLUCOSE 160 (H) 02/11/2020 0727   BUN 16 03/12/2024 0918   CREATININE 0.66 03/12/2024 0918   CREATININE 0.89 02/11/2020 0727   CALCIUM  10.0 03/12/2024 0918   PROT 6.6 03/12/2024 0918   ALBUMIN 4.1 03/12/2024 0918   AST 14 03/12/2024 0918   ALT 12 03/12/2024 0918   ALKPHOS 76 03/12/2024 0918   BILITOT 0.3 03/12/2024 0918   GFRNONAA 68 02/11/2020 0727   GFRAA 79 02/11/2020 0727      Diabetic Labs (most recent): Lab Results  Component Value Date   HGBA1C 6.9 03/19/2024   HGBA1C 6.9 11/13/2023   HGBA1C 7.1 (A) 07/16/2023   MICROALBUR 30 05/26/2021   MICROALBUR 1.1 02/11/2020   MICROALBUR 22.8 11/27/2019   Lipid Panel     Component Value Date/Time   CHOL 145 03/12/2024 0918   TRIG 165 (H) 03/12/2024 0918   HDL 43 03/12/2024 9081  CHOLHDL 3.4 03/12/2024 0918   CHOLHDL 3.3 02/11/2020 0727   LDLCALC 74 03/12/2024 0918   LDLCALC 68 02/11/2020 0727      Assessment & Plan:   1. Uncontrolled type 2 diabetes mellitus with other circulatory complication, without long-term current use of insulin  [facility])   - Patient has currently improving  type 2 DM since  68 years of age.  She presents with continued improvement in her glycemic profile.  Her freestyle libre device AGP shows 91% time in range, 9% level 1 hyperglycemia.  For point-of-care A1c is 6.9%, stay stable.  She has no hypoglycemia.    - Recent labs reviewed.   Her diabetes is complicated by TIA, chronic heavy smoking and patient remains at a high risk for more acute and chronic complications of diabetes which include CAD, CVA, CKD, retinopathy, and neuropathy. These are all discussed in detail with the patient.  - she acknowledges that there is a room for improvement in her food and drink choices. - Suggestion is made for her to avoid simple carbohydrates  from her diet including Cakes, Sweet Desserts, Ice Cream, Soda (diet and regular), Sweet Tea, Candies, Chips, Cookies, Store Bought Juices, Alcohol , Artificial Sweeteners,  Coffee Creamer, and Sugar-free Products, Lemonade. This will help patient to have more stable blood glucose profile and potentially avoid unintended weight gain.  The following Lifestyle Medicine recommendations according to American College of Lifestyle Medicine  [facility]) were discussed and and offered to patient and she  agrees to start the journey:  A. Whole Foods, Plant-Based  Nutrition comprising of fruits and vegetables, plant-based proteins, whole-grain carbohydrates was discussed in detail with the patient.   A list for source of those nutrients were also provided to the patient.  Patient will use only water or unsweetened tea for hydration. B.  The need to stay away from risky substances including alcohol, smoking; obtaining 7 to 9 hours of restorative sleep, at least 150 minutes of moderate intensity exercise weekly, the importance of healthy social connections,  and stress management techniques were discussed. C.  A full color page of  Calorie density of various food groups per pound showing examples of each food groups was provided to the patient.   - I have approached patient with the following individualized plan to manage diabetes and patient agrees:   Based on her presentation with target glycemic profile, she would not need prandial insulin .  She is advised to continue Toujeo  30 units nightly, advised to utilize her CGM continuously.  I discussed and ordered the freestyle libre 2+ sensor for her.   -She continued to tolerate Ozempic , discussed and increased her dose to 2 mg subcutaneously weekly.  Side effects and precautions discussed with her. -She is advised to continue metformin  850 mg by mouth twice a day, therapeutically suitable for patient.  She is encouraged to call clinic for hypoglycemia below 70 or hyperglycemia greater than 200 mg per DL.    - Patient specific target  A1c;  LDL, HDL, Triglycerides,  were discussed in detail. She is advised to quit smoking.    2) BP/HTN:  Her blood pressure is controlled to target.  She is advised to continue her current blood pressure medications including lisinopril  20 mg p.o. daily.    3) Lipids/HPL: Her recent lipid panel showed controlled LDL at 74.  She is advised to continue atorvastatin  20 mg p.o. nightly.  Side effects and precautions discussed with her.    4)  Weight/Diet: Her BMI is  32.88,  progressively losing weight.  This still is clearly complicating her diabetes care.  She is a candidate for modest weight loss.  She did not have any further success in weight loss, she still admits significant  dietary indiscretion, CDE Consult  Is in progress  , exercise, and detailed carbohydrates information provided. Possible plant-based diet discussed above will help with her chronic comorbidities including obesity.  6) Chronic Care/Health Maintenance:  -Patient is on ACEI and Statin medications and encouraged to continue to follow up with Ophthalmology, Podiatrist at least yearly or according to recommendations, and advised to  quit smoking. I have recommended yearly flu vaccine and pneumonia vaccination at least every 5 years; moderate intensity exercise for up to 150 minutes weekly; and  sleep for at least 7 hours a day.  The patient was counseled on the dangers of tobacco use, and was advised to quit.  Reviewed strategies to maximize success, including removing cigarettes and smoking materials from environment.   She had normal ABI on September 01, 2020, this study will be repeated in March 2027 or sooner if needed. Her diabetic foot exam was normal March 19, 2024  - I advised patient to maintain close follow up with Burdine, Elspeth BRAVO, MD for primary care needs.   I spent  41  minutes in the care of the patient today including review of labs from CMP, Lipids, Thyroid  Function, Hematology (current and previous including abstractions from other facilities); face-to-face time discussing  her blood glucose readings/logs, discussing hypoglycemia and hyperglycemia episodes and symptoms, medications doses, her options of short and long term treatment based on the latest standards of care / guidelines;  discussion about incorporating lifestyle medicine;  and documenting the encounter. Risk reduction counseling performed per USPSTF guidelines to reduce  obesity and cardiovascular risk factors.      Please refer to Patient Instructions for Blood Glucose Monitoring and Insulin /Medications Dosing Guide  in media tab for additional information. Please  also refer to  Patient Self Inventory in the Media  tab for reviewed elements of pertinent patient history.  Charolette JINNY Hock participated in the discussions, expressed understanding, and voiced agreement with the above plans.  All questions were answered to her satisfaction. she is encouraged to contact clinic should she have any questions or concerns prior to her return visit.    Follow up plan: - Return in about 4 months (around 07/19/2024) for Bring Meter/CGM Device/Logs- A1c in Office.  Ranny Earl, MD Phone: [phone]  Fax: [phone]   -  This note was partially dictated with voice recognition software. Similar sounding words can be transcribed inadequately or may not  be corrected upon review.  03/19/2024, 12:36 PM

## 2024-03-19 NOTE — Patient Instructions (Signed)

## 2024-06-05 ENCOUNTER — Other Ambulatory Visit: Payer: Self-pay | Admitting: "Endocrinology

## 2024-06-05 DIAGNOSIS — Z794 Long term (current) use of insulin: Secondary | ICD-10-CM

## 2024-06-15 ENCOUNTER — Other Ambulatory Visit: Payer: Self-pay | Admitting: "Endocrinology

## 2024-07-21 ENCOUNTER — Ambulatory Visit: Admitting: "Endocrinology
